# Patient Record
Sex: Female | Born: 1940 | Race: White | Hispanic: No | Smoking: Former smoker
Health system: Southern US, Community
[De-identification: ages and names within clinical notes are randomized; demographics above are authoritative.]

## PROBLEM LIST (undated history)

## (undated) DIAGNOSIS — K5790 Diverticulosis of intestine, part unspecified, without perforation or abscess without bleeding: Secondary | ICD-10-CM

## (undated) DIAGNOSIS — D510 Vitamin B12 deficiency anemia due to intrinsic factor deficiency: Secondary | ICD-10-CM

## (undated) DIAGNOSIS — J449 Chronic obstructive pulmonary disease, unspecified: Secondary | ICD-10-CM

## (undated) DIAGNOSIS — R519 Headache, unspecified: Secondary | ICD-10-CM

## (undated) DIAGNOSIS — K219 Gastro-esophageal reflux disease without esophagitis: Secondary | ICD-10-CM

## (undated) DIAGNOSIS — J439 Emphysema, unspecified: Secondary | ICD-10-CM

## (undated) DIAGNOSIS — E0500 Thyrotoxicosis with diffuse goiter without thyrotoxic crisis or storm: Secondary | ICD-10-CM

## (undated) DIAGNOSIS — K449 Diaphragmatic hernia without obstruction or gangrene: Secondary | ICD-10-CM

## (undated) DIAGNOSIS — B019 Varicella without complication: Secondary | ICD-10-CM

## (undated) DIAGNOSIS — D649 Anemia, unspecified: Secondary | ICD-10-CM

## (undated) DIAGNOSIS — R51 Headache: Secondary | ICD-10-CM

## (undated) DIAGNOSIS — I639 Cerebral infarction, unspecified: Secondary | ICD-10-CM

## (undated) DIAGNOSIS — K635 Polyp of colon: Secondary | ICD-10-CM

## (undated) DIAGNOSIS — E785 Hyperlipidemia, unspecified: Secondary | ICD-10-CM

## (undated) DIAGNOSIS — M1990 Unspecified osteoarthritis, unspecified site: Secondary | ICD-10-CM

## (undated) DIAGNOSIS — Z972 Presence of dental prosthetic device (complete) (partial): Secondary | ICD-10-CM

## (undated) HISTORY — DX: Chronic obstructive pulmonary disease, unspecified: J44.9

## (undated) HISTORY — PX: OTHER SURGICAL HISTORY: SHX169

## (undated) HISTORY — DX: Emphysema, unspecified: J43.9

## (undated) HISTORY — PX: MASTECTOMY: SHX3

## (undated) HISTORY — DX: Thyrotoxicosis with diffuse goiter without thyrotoxic crisis or storm: E05.00

## (undated) HISTORY — DX: Anemia, unspecified: D64.9

## (undated) HISTORY — DX: Hyperlipidemia, unspecified: E78.5

## (undated) HISTORY — PX: EYE SURGERY: SHX253

## (undated) HISTORY — PX: CHOLECYSTECTOMY: SHX55

---

## 1971-07-10 HISTORY — PX: BREAST SURGERY: SHX581

## 1972-07-09 HISTORY — PX: ABDOMINAL HYSTERECTOMY: SHX81

## 1974-07-09 HISTORY — PX: AUGMENTATION MAMMAPLASTY: SUR837

## 1994-07-09 HISTORY — PX: AUGMENTATION MAMMAPLASTY: SUR837

## 2005-06-04 ENCOUNTER — Ambulatory Visit: Payer: Self-pay | Admitting: Internal Medicine

## 2008-05-07 ENCOUNTER — Inpatient Hospital Stay: Payer: Self-pay | Admitting: Specialist

## 2011-01-01 ENCOUNTER — Ambulatory Visit: Payer: Self-pay | Admitting: Gastroenterology

## 2012-12-10 ENCOUNTER — Inpatient Hospital Stay: Payer: Self-pay | Admitting: Internal Medicine

## 2012-12-10 LAB — CK TOTAL AND CKMB (NOT AT ARMC)
CK, Total: 120 U/L (ref 21–215)
CK, Total: 143 U/L (ref 21–215)
CK-MB: 2.7 ng/mL (ref 0.5–3.6)

## 2012-12-10 LAB — COMPREHENSIVE METABOLIC PANEL
Albumin: 3.3 g/dL — ABNORMAL LOW (ref 3.4–5.0)
Anion Gap: 5 — ABNORMAL LOW (ref 7–16)
BUN: 14 mg/dL (ref 7–18)
Bilirubin,Total: 0.4 mg/dL (ref 0.2–1.0)
Chloride: 112 mmol/L — ABNORMAL HIGH (ref 98–107)
Co2: 21 mmol/L (ref 21–32)
Creatinine: 0.93 mg/dL (ref 0.60–1.30)
EGFR (African American): >60
Glucose: 101 mg/dL — ABNORMAL HIGH (ref 65–99)
Potassium: 3.9 mmol/L (ref 3.5–5.1)
SGOT(AST): 52 U/L — ABNORMAL HIGH (ref 15–37)
SGPT (ALT): 35 U/L (ref 12–78)
Sodium: 138 mmol/L (ref 136–145)
Total Protein: 6.8 g/dL (ref 6.4–8.2)

## 2012-12-10 LAB — CBC
MCH: 30.9 pg (ref 26.0–34.0)
MCV: 93 fL (ref 80–100)
RDW: 13.6 % (ref 11.5–14.5)
WBC: 3 10*3/uL — ABNORMAL LOW (ref 3.6–11.0)

## 2012-12-10 LAB — TROPONIN I: Troponin-I: <0.02 ng/mL

## 2012-12-11 LAB — URINALYSIS, COMPLETE
Bilirubin,UR: NEGATIVE
Nitrite: NEGATIVE
Ph: 6 (ref 4.5–8.0)
RBC,UR: 1 /HPF (ref 0–5)
Specific Gravity: 1.011 (ref 1.003–1.030)
Squamous Epithelial: 2

## 2012-12-11 LAB — CBC WITH DIFFERENTIAL/PLATELET
Basophil #: 0 10*3/uL (ref 0.0–0.1)
Basophil %: 0.9 %
Eosinophil #: 0.1 10*3/uL (ref 0.0–0.7)
Lymphocyte #: 1.3 10*3/uL (ref 1.0–3.6)
Lymphocyte %: 31.3 %
MCH: 30.2 pg (ref 26.0–34.0)
MCV: 92 fL (ref 80–100)
Monocyte #: 0.4 x10 3/mm (ref 0.2–0.9)
Neutrophil #: 2.4 10*3/uL (ref 1.4–6.5)
Neutrophil %: 57.3 %
Platelet: 113 10*3/uL — ABNORMAL LOW (ref 150–440)
RBC: 3.5 10*6/uL — ABNORMAL LOW (ref 3.80–5.20)
RDW: 13.4 % (ref 11.5–14.5)

## 2012-12-11 LAB — BASIC METABOLIC PANEL
Anion Gap: 4 — ABNORMAL LOW (ref 7–16)
Calcium, Total: 8.1 mg/dL — ABNORMAL LOW (ref 8.5–10.1)
Chloride: 113 mmol/L — ABNORMAL HIGH (ref 98–107)
Co2: 21 mmol/L (ref 21–32)
Creatinine: 0.79 mg/dL (ref 0.60–1.30)
EGFR (African American): >60
Glucose: 96 mg/dL (ref 65–99)
Osmolality: 276 (ref 275–301)
Potassium: 3.4 mmol/L — ABNORMAL LOW (ref 3.5–5.1)

## 2012-12-11 LAB — CK TOTAL AND CKMB (NOT AT ARMC): CK, Total: 137 U/L (ref 21–215)

## 2012-12-11 LAB — LIPID PANEL
Ldl Cholesterol, Calc: 68 mg/dL (ref 0–100)
Triglycerides: 54 mg/dL (ref 0–200)

## 2013-07-04 ENCOUNTER — Emergency Department: Payer: Self-pay | Admitting: Emergency Medicine

## 2013-07-04 LAB — RAPID INFLUENZA A&B ANTIGENS

## 2013-12-09 ENCOUNTER — Ambulatory Visit: Payer: Self-pay | Admitting: Orthopedic Surgery

## 2013-12-09 DIAGNOSIS — S22080A Wedge compression fracture of T11-T12 vertebra, initial encounter for closed fracture: Secondary | ICD-10-CM | POA: Insufficient documentation

## 2013-12-09 LAB — CBC WITH DIFFERENTIAL/PLATELET
Basophil #: 0.1 10*3/uL (ref 0.0–0.1)
Basophil %: 1.4 %
EOS PCT: 3.3 %
Eosinophil #: 0.1 10*3/uL (ref 0.0–0.7)
HCT: 36.1 % (ref 35.0–47.0)
HGB: 11.7 g/dL — ABNORMAL LOW (ref 12.0–16.0)
LYMPHS ABS: 2.2 10*3/uL (ref 1.0–3.6)
LYMPHS PCT: 49.1 %
MCH: 30.3 pg (ref 26.0–34.0)
MCHC: 32.5 g/dL (ref 32.0–36.0)
MCV: 93 fL (ref 80–100)
Monocyte #: 0.4 x10 3/mm (ref 0.2–0.9)
Monocyte %: 9 %
NEUTROS ABS: 1.6 10*3/uL (ref 1.4–6.5)
NEUTROS PCT: 37.2 %
Platelet: 132 10*3/uL — ABNORMAL LOW (ref 150–440)
RBC: 3.88 10*6/uL (ref 3.80–5.20)
RDW: 12.7 % (ref 11.5–14.5)
WBC: 4.4 10*3/uL (ref 3.6–11.0)

## 2013-12-10 ENCOUNTER — Ambulatory Visit: Payer: Self-pay | Admitting: Orthopedic Surgery

## 2013-12-10 HISTORY — PX: BACK SURGERY: SHX140

## 2013-12-10 HISTORY — PX: KYPHOPLASTY: SHX5884

## 2013-12-11 LAB — PATHOLOGY REPORT

## 2013-12-25 DIAGNOSIS — Z9889 Other specified postprocedural states: Secondary | ICD-10-CM | POA: Insufficient documentation

## 2014-02-17 DIAGNOSIS — I509 Heart failure, unspecified: Secondary | ICD-10-CM | POA: Insufficient documentation

## 2014-07-22 ENCOUNTER — Ambulatory Visit: Payer: Self-pay | Admitting: Family Medicine

## 2014-10-29 NOTE — H&P (Signed)
PATIENT NAME:  Robin Graves, Robin Graves MR#:  [id number] DATE OF BIRTH:  17-Aug-1940  DATE OF ADMISSION:  12/10/2012  PRIMARY CARE PHYSICIAN: Dr. Jeananne Rama.   PRIMARY CARDIOLOGIST:  Dr. Saralyn Pilar.  CHIEF COMPLAINT: Chest pain.   HISTORY OF PRESENT ILLNESS: A 74 year old female with a history of coronary artery disease, hyperlipidemia, and Graves' disease who presents with the above complaint. 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 On a scale of 1 to 10 it was a 7 out of 10, worsened by any kind of activity and relieved after nitroglycerin. EMS was called. Her heart rates were initially in the 40s; improved to the 70s with 0.5 mg of  atropine. She also was given aspirin. Upon arrival she was noted to have a low blood pressure of 90/60. She was given a fluid bolus. Her chest pain has resolved and her blood pressure has improved. She received nitroglycerin spray, fentanyl and Zofran.   REVIEW OF SYSTEMS:  CONSTITUTIONAL: No fevers or weakness, although she did say that yesterday she was not feeling quite herself.  EYES: No blurred or double vision. No glaucoma.  EARS/NOSE/THROAT: No ear pain, hearing loss, seasonal allergies, or dentures.  RESPIRATOR: No cough, wheezing, hemoptysis, painful respirations.  CARDIOVASCULAR: Positive chest pain, as mentioned above. No palpitations, orthopnea, syncope, edema, dyspnea on exertion.  GASTROINTESTINAL: No nausea, vomiting, diarrhea, abdominal pain, melena, or ulcers.  GENITOURINARY: No dysuria or hematuria.  ENDOCRINE: No polyuria, polydipsia.  HEMATOLOGIC/LYMPHATIC: No anemia or easy bruising.  MUSCULOSKELETAL:  Noarthritis pain NEUROLOGIC:  No history of CVA or seizures.  PSYCHIATRIC: No history of anxiety or depression.   PAST MEDICAL HISTORY: 1.  Graves' disease.  2.  CAD, status post MI in 1982. 3.   Hypothyroidism.  4.  Hyperlipidemia.   SURGICAL HISTORY: 1.  Bilateral mastectomies.  2.  Total hysterectomy.   MEDICATIONS: 1.  Synthroid 50 mcg daily.  2.  Pravastatin 80 mg daily.  3.  Pantoprazole 40 mg daily.  4.  Vitamin B12 1000 mcg monthly.  5.  Atenolol 25 mg daily.  6.  Aspirin 81 mg daily.   ALLERGIES: No known drug allergies.   SOCIAL HISTORY: Patient smokes maybe 1 cigarette every 2 to 3 weeks. No alcohol or IV drug use.   FAMILY HISTORY: No history of CAD under 55. DVTs and PEs in the family.   PHYSICAL EXAMINATION: VITAL SIGNS: Temperature 97.7, pulse is 55, respirations 18, blood pressure 94/51; 94% on room air.  GENERAL: The patient is alert, oriented, not in acute distress.   HEENT: Head is atraumatic. Pupils are round and reactive. Sclerae anicteric. Mucous membranes moist.  OROPHARYNX: Clear.  NECK: Supple, without JVD, carotid bruit or enlarged thyroid.  CARDIOVASCULAR: Regular rate and rhythm. No murmurs, gallops, or rubs. PMI is not displaced.  LUNGS: Clear to auscultation, without crackles, rales, or wheezing. She does have some distant heart sounds. She has some minimal rhonchi in the right, greater than left.  ABDOMEN: Bowel sounds are positive. Nontender, nondistended. No hepatosplenomegaly.  EXTREMITIES: No clubbing, cyanosis, or edema.  SKIN: Normal color. No rashes are seen.   NEUROLOGIC: Cranial nerves II-XII are grossly intact. There are no focal deficits. Motor strength is 4, bilateral, upper and lower extremities.   LABORATORIES: Sodium 138, potassium 3.9, chloride 112, bicarb 21, BUN 14, creatinine 0.93, glucose 101, calcium 8.4, bilirubin is 0.4,  alk phos 144, ALT 35, AST 52, total protein 6.8, albumin 2.3. White blood cells 3, hemoglobin 9.5, hematocrit 34.5, platelets are 120. Troponin are less than 0.02. TSH 1.08. CPK 120, CK-MB 1.2.   Chest x-ray shows possible atelectasis versus developing pneumonia, left lower lung base.   EKG shows  normal sinus rhythm. No ST elevation or depression.   ASSESSMENT AND PLAN: This is a 74 year old female with a history of coronary artery disease who presents with unstable angina.   1.  Unstable angina: The patient will be admitted to the step-down unit due to her bradycardia. She will be evaluated for acute myocardial infarction by continuing to cycle her cardiac enzymes. Her platelets are slightly. I placed her on Lovenox 1 mg/kg subcutaneous every  12 hours, so will need to monitor carefully her platelets also. Continue aspirin. Hold beta blocker due to her bradycardia, and will continue nitroglycerin patch as well as Pravachol. I have also consulted Dr. Saralyn Pilar for assistance. 2.  Bradycardia: The patient's heart rate has improved with atropine. Will hold atenolol. The patient currently has Zoll pads on, which we will continue, and will monitor her heart rate closely. We may need to use atropine if she is symptomatic from her bradycardia or heart rate is in the 40s again, or lower.  3.  Hyperlipidemia: Will continue pravastatin.  4.  Hypothyroidism, with a history of Graves' disease, on Synthroid: Continue Synthroid.  5. Smoking dependence: The patient still smokes 1 cigarette every couple of weeks, and she was encouraged to stop smoking. She was counseled for 3 minutes. She says that she does not really want to give up her 1 cigarette every few weeks.  6.  THE PATIENT IS  FULL CODE.   Time spent: Approximately 50 minutes.   ____________________________ Donell Beers. Benjie Karvonen, MD spm:dm D: 12/10/2012 12:06:48 ET T: 12/10/2012 12:44:17 ET JOB#: [id number]  cc: Reizel Calzada P. Benjie Karvonen, MD, <Dictator> Isaias Cowman, MD Guadalupe Maple, MD Donell Beers Camie Hauss MD ELECTRONICALLY SIGNED 12/10/2012 14:36

## 2014-10-29 NOTE — Consult Note (Signed)
   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 She described this as 8/10. She was brought to the er where she was noted to have a normal serum troponin and ekg was unremakrable for acute ischemic changes. SHe has had no further chest pain and continues to rule out for mi.   Physical Exam:  GEN no acute distress   HEENT PERRL, hearing intact to voice   NECK supple   RESP clear BS   CARD Regular rate and rhythm   ABD denies tenderness  denies Flank Tenderness  no hernia  normal BS   LYMPH negative neck   EXTR negative cyanosis/clubbing, negative edema   SKIN normal to palpation   NEURO cranial nerves intact, motor/sensory function intact   PSYCH A+O to time, place, person   Review of Systems:  Subjective/Chief Complaint chestpain with radiation to left arm   General: No Complaints   Skin: No Complaints   ENT: No Complaints   Eyes: No Complaints   Neck: No Complaints   Respiratory: No Complaints   Cardiovascular: Chest pain or discomfort  Tightness   Gastrointestinal: No Complaints   Genitourinary: No Complaints   Vascular: No Complaints   Musculoskeletal: No Complaints   Neurologic: No Complaints   Hematologic: No Complaints   Endocrine: No Complaints   Psychiatric: No Complaints   Review of Systems: All other systems were reviewed and found to be negative   Medications/Allergies Reviewed Medications/Allergies reviewed        Admit Diagnosis:   UNSTABLE ANGINE PECTORIS: Onset Date: 10-Dec-2012, Status: Active, Description: UNSTABLE ANGINE PECTORIS  EKG:  EKG NSR   Abnormal NSSTTW changes    No Known Allergies:    Impression 74 yo femal with histoyr of cad s/p mi in the past who was admitted after presenitng with mid sternal chest  pain occurring while working in [employer]. SHe has ruled out for an mi and ekg is unremarkable. No chf clinnically or by cxr. Etiology of pain is typical for angina but no evidence of injury or ischemia at present   Plan 1. Conitnue with current meds and rule out for mi 2. Will review echo when availabe 3. FUncitonal study in am to assess for ischemia 4. Further recs after myoview.   Electronic Signatures: Teodoro Spray (MD)  (Signed 04-Jun-14 21:12)  Authored: General Aspect/Present Illness, History and Physical Exam, Review of System, Health Issues, EKG , Allergies, Impression/Plan   Last Updated: 04-Jun-14 21:12 by Teodoro Spray (MD)

## 2014-10-29 NOTE — Discharge Summary (Signed)
PATIENT NAME:  Robin Graves, Robin Graves MR#:  [id number] DATE OF BIRTH:  03/06/1941  DATE OF ADMISSION:  12/10/2012 DATE OF DISCHARGE:  12/11/2012  ADMISSION DIAGNOSIS: 1.  Chest pain.  2.  Bradycardia.  DISCHARGE DIAGNOSES: 1.  Chest pain.  2.  Bradycardia asymptomatic.   CONSULTATIONS: Cardiology.  2-D echocardiogram showed normal ejection fraction with EF of 55% to 60% with mild mitral valve regurg and tricuspid regurg.    Myovoew showed no evidence of ischemia Troponins were negative.   Sodium 138, potassium 4.8, chloride 113, bicarb 21, BUN 14, creatinine 0.79, glucose 96. LDL is 68, VLDL 11, HDL 38, triglycerides 54, cholesterol 117. White blood cells 4.3, hemoglobin 11, hematocrit 33, platelets are 113.   HOSPITAL COURSE:  A 74 year old female presented with chest pain found to have bradycardia. For further details, please refer to H and P.  1.  Chest pain.  The patient is undergoing a Myoview stress test.  This is negative for acute ischemia. The patient will be discharged home. Her troponins were negative. No acute changes on telemetry. 2.  Bradycardia, asymptomatic. The patient received atropine by the EMS for heart rate in the 40s. However, here her heart rate runs anywhere between 45 to actually 80.  We stopped atenolol and this has actually improved her heart rate. Her heart rate does increase with ambulation.  3.  Hyperlipidemia.  The patient will continue statin.  4.  Tobacco abuse.  The patient was counseled. She smokes a couple cigarettes weekly. She does not want a nicotine patch. 5.  Urinary tract infection.  The patient was started on Keflex.  DISCHARGE MEDICATIONS: 1.  Aspirin 81 mg daily.  2.  Pravastatin 80 mg daily.  3.  Pantoprazole 40 mg daily. 4.  Synthroid 50 mcg daily.  5.  Vitamin B12 1000 mcg monthly.  6.  Keflex 500 mg q. 8 hours x 7 days.   DISCHARGE DIET:  Low sodium.    DISCHARGE ACTIVITY:  As tolerated.  DISCHARGE FOLLOWUP:  The patient will follow up  with Dr. Ubaldo Glassing in 1 week and Dr. Golden Pop in 1 week.  TIME SPENT:  35 minutes   ____________________________ Neziah Braley P. Benjie Karvonen, MD spm:ce D: 12/11/2012 14:41:36 ET T: 12/11/2012 15:40:53 ET JOB#: [id number]  cc: Mehki Klumpp P. Benjie Karvonen, MD, <Dictator> Guadalupe Maple, MD Donell Beers Jethro Radke MD ELECTRONICALLY SIGNED 12/12/2012 14:07

## 2014-10-30 NOTE — Op Note (Signed)
PATIENT NAME:  Robin Graves, Robin Graves MR#:  [id number] DATE OF BIRTH:  02/09/1941  DATE OF PROCEDURE:  12/10/2013  PREOPERATIVE DIAGNOSIS: T12 compression fracture.   POSTOPERATIVE DIAGNOSIS: T12 compression fracture.   PROCEDURE: T12 kyphoplasty.   ANESTHESIA: MAC.   SURGEON: Hessie Knows, MD   DESCRIPTION OF PROCEDURE: The patient was brought to the operating room, and after adequate anesthesia was obtained, the patient was placed prone. C-arm was brought in and very good visualization of the T12 compressed fracture was obtained. Timeout procedure and patient identification procedure completed. The skin was prepped with alcohol and 5 mL of 1% Xylocaine was infiltrated on both sides at T12 in the subcutaneous tissue. Next, the back was prepped and draped in the usual sterile fashion.  Repeat timeout procedure completed. Spinal needle was then used to give local anesthetic down to the pedicle on the right side with a 50/50 mix of 1% Xylocaine and 0.5% Sensorcaine with epinephrine. Stab incision was made in the trocar from the kyphoplasty set, advanced to the level of the pedicle, extrapedicular approach being utilized. The vertebral body was entered and a biopsy obtained. The drill was then passed under fluoroscopic guidance and a balloon inserted and inflated. There appeared to be a partial correction of a significant deformity. After inflating the balloon, the balloon was deflated and the cement when ready was inserted with approximately 3 mL inserted. There was very good fill on interdigitation.  The trocar was removed and permanent C-arm views were obtained. The wound was closed with Dermabond, followed by a Band-Aid. The patient was sent to the recovery room in stable condition.  ESTIMATED BLOOD LOSS:  Minimal.   COMPLICATIONS: None.   SPECIMEN: T12 vertebral body biopsy.   CONDITION IN RECOVERY ROOM:  Stable.    ____________________________ Laurene Footman, MD mjm:dd D: 12/11/2013 00:02:09  ET T: 12/11/2013 05:06:04 ET JOB#: [id number]  cc: Laurene Footman, MD, <Dictator> Laurene Footman MD ELECTRONICALLY SIGNED 12/11/2013 11:51

## 2015-01-06 ENCOUNTER — Ambulatory Visit (INDEPENDENT_AMBULATORY_CARE_PROVIDER_SITE_OTHER): Payer: Medicare Other | Admitting: Family Medicine

## 2015-01-06 ENCOUNTER — Encounter: Payer: Self-pay | Admitting: Family Medicine

## 2015-01-06 VITALS — BP 131/64 | HR 69 | Temp 97.9°F | Ht <= 58 in | Wt 132.0 lb

## 2015-01-06 DIAGNOSIS — I10 Essential (primary) hypertension: Secondary | ICD-10-CM | POA: Diagnosis not present

## 2015-01-06 DIAGNOSIS — E785 Hyperlipidemia, unspecified: Secondary | ICD-10-CM | POA: Diagnosis not present

## 2015-01-06 DIAGNOSIS — E039 Hypothyroidism, unspecified: Secondary | ICD-10-CM | POA: Insufficient documentation

## 2015-01-06 DIAGNOSIS — I2510 Atherosclerotic heart disease of native coronary artery without angina pectoris: Secondary | ICD-10-CM | POA: Diagnosis not present

## 2015-01-06 DIAGNOSIS — I2583 Coronary atherosclerosis due to lipid rich plaque: Secondary | ICD-10-CM

## 2015-01-06 DIAGNOSIS — E079 Disorder of thyroid, unspecified: Secondary | ICD-10-CM | POA: Diagnosis not present

## 2015-01-06 MED ORDER — PRAVASTATIN SODIUM 80 MG PO TABS: 80.0000 mg | ORAL_TABLET | Freq: Every day | ORAL | Status: DC

## 2015-01-06 MED ORDER — IBUPROFEN 800 MG PO TABS: 800.0000 mg | ORAL_TABLET | Freq: Three times a day (TID) | ORAL | Status: DC | PRN

## 2015-01-06 MED ORDER — LEVOTHYROXINE SODIUM 75 MCG PO TABS: 75.0000 ug | ORAL_TABLET | Freq: Every day | ORAL | Status: DC

## 2015-01-06 NOTE — Progress Notes (Signed)
   BP 131/64 mmHg  Pulse 69  Temp(Src) 97.9 F (36.6 C)  Ht 4\' 9"  (1.448 m)  Wt 132 lb (59.875 kg)  BMI 28.56 kg/m2  SpO2 97%   Subjective:    Patient ID: Robin Graves, female    DOB: Sep 05, 1940, 74 y.o.   MRN: [id number]  HPI: Robin Graves is a 74 y.o. female  Chief Complaint  Patient presents with  . Hypertension  BP doing well off meds Thyroid no problems CAD no sx Lipids stable on meds No side effects meds Takes every day   Relevant past medical, surgical, family and social history reviewed and updated as indicated. Interim medical history since our last visit reviewed. Allergies and medications reviewed and updated.  Review of Systems  Constitutional: Negative.   Respiratory: Negative.   Cardiovascular: Negative.     Per HPI unless specifically indicated above     Objective:    BP 131/64 mmHg  Pulse 69  Temp(Src) 97.9 F (36.6 C)  Ht 4\' 9"  (1.448 m)  Wt 132 lb (59.875 kg)  BMI 28.56 kg/m2  SpO2 97%  Wt Readings from Last 3 Encounters:  01/06/15 132 lb (59.875 kg)  07/07/14 129 lb (58.514 kg)    Physical Exam  Constitutional: She is oriented to person, place, and time. She appears well-developed and well-nourished. No distress.  HENT:  Head: Normocephalic and atraumatic.  Right Ear: Hearing normal.  Left Ear: Hearing normal.  Nose: Nose normal.  Eyes: Conjunctivae and lids are normal. Right eye exhibits no discharge. Left eye exhibits no discharge. No scleral icterus.  Cardiovascular: Normal rate, regular rhythm and normal heart sounds.   Pulmonary/Chest: Effort normal and breath sounds normal. No respiratory distress.  Musculoskeletal: Normal range of motion.  Neurological: She is alert and oriented to person, place, and time.  Skin: Skin is intact. No rash noted.  Psychiatric: She has a normal mood and affect. Her speech is normal and behavior is normal. Judgment and thought content normal. Cognition and memory are normal.        Assessment &  Plan:   Problem List Items Addressed This Visit      Cardiovascular and Mediastinum   Hypertension    Diet controled      Relevant Medications   pravastatin (PRAVACHOL) 80 MG tablet   CAD (coronary artery disease)    Followed at cardiology      Relevant Medications   pravastatin (PRAVACHOL) 80 MG tablet     Endocrine   Thyroid disease    The current medical regimen is effective;  continue present plan and medications.       Relevant Medications   levothyroxine (SYNTHROID, LEVOTHROID) 75 MCG tablet     Other   Hyperlipidemia    The current medical regimen is effective;  continue present plan and medications.       Relevant Medications   pravastatin (PRAVACHOL) 80 MG tablet   Other Relevant Orders   AST   ALT   Lipid panel    Other Visit Diagnoses    Essential hypertension, benign    -  Primary    Relevant Medications    pravastatin (PRAVACHOL) 80 MG tablet    Other Relevant Orders    Basic metabolic panel        Follow up plan: Return in about 6 months (around 07/08/2015) for Physical Exam.

## 2015-01-06 NOTE — Assessment & Plan Note (Signed)
The current medical regimen is effective;  continue present plan and medications.  

## 2015-01-06 NOTE — Assessment & Plan Note (Signed)
Followed at cardiology

## 2015-01-06 NOTE — Assessment & Plan Note (Signed)
Diet controled 

## 2015-01-07 LAB — BASIC METABOLIC PANEL
BUN/Creatinine Ratio: 10 — ABNORMAL LOW (ref 11–26)
BUN: 9 mg/dL (ref 8–27)
CALCIUM: 8.5 mg/dL — AB (ref 8.7–10.3)
CO2: 25 mmol/L (ref 18–29)
CREATININE: 0.88 mg/dL (ref 0.57–1.00)
Chloride: 103 mmol/L (ref 97–108)
GFR calc non Af Amer: 65 mL/min/{1.73_m2} (ref >59)
GFR, EST AFRICAN AMERICAN: 75 mL/min/{1.73_m2} (ref >59)
Glucose: 81 mg/dL (ref 65–99)
POTASSIUM: 4.2 mmol/L (ref 3.5–5.2)
Sodium: 138 mmol/L (ref 134–144)

## 2015-01-07 LAB — LIPID PANEL
CHOL/HDL RATIO: 3.3 ratio (ref 0.0–4.4)
Cholesterol, Total: 141 mg/dL (ref 100–199)
HDL: 43 mg/dL (ref >39)
LDL Calculated: 76 mg/dL (ref 0–99)
Triglycerides: 110 mg/dL (ref 0–149)
VLDL CHOLESTEROL CAL: 22 mg/dL (ref 5–40)

## 2015-01-07 LAB — ALT
ALT: 6 IU/L (ref 0–32)
ALT: 6 IU/L (ref 0–32)

## 2015-01-07 LAB — AST
AST: 14 IU/L (ref 0–40)
AST: 13 IU/L (ref 0–40)

## 2015-03-08 ENCOUNTER — Other Ambulatory Visit: Payer: Self-pay | Admitting: Orthopedic Surgery

## 2015-03-08 ENCOUNTER — Ambulatory Visit
Admission: RE | Admit: 2015-03-08 | Discharge: 2015-03-08 | Disposition: A | Discharge status: Home / Self Care | Payer: Medicare Other | Source: Ambulatory Visit | Attending: Orthopedic Surgery | Admitting: Orthopedic Surgery

## 2015-03-08 DIAGNOSIS — M4806 Spinal stenosis, lumbar region: Secondary | ICD-10-CM | POA: Diagnosis not present

## 2015-03-08 DIAGNOSIS — M545 Low back pain: Secondary | ICD-10-CM

## 2015-03-08 DIAGNOSIS — X58XXXA Exposure to other specified factors, initial encounter: Secondary | ICD-10-CM | POA: Insufficient documentation

## 2015-03-08 DIAGNOSIS — M546 Pain in thoracic spine: Secondary | ICD-10-CM

## 2015-03-08 DIAGNOSIS — M4854XA Collapsed vertebra, not elsewhere classified, thoracic region, initial encounter for fracture: Secondary | ICD-10-CM | POA: Diagnosis not present

## 2015-03-10 ENCOUNTER — Other Ambulatory Visit: Payer: Medicare Other

## 2015-03-15 ENCOUNTER — Encounter: Admission: RE | Payer: Self-pay | Source: Ambulatory Visit

## 2015-03-15 ENCOUNTER — Ambulatory Visit: Admission: RE | Admit: 2015-03-15 | Payer: Medicare Other | Source: Ambulatory Visit | Admitting: Orthopedic Surgery

## 2015-03-15 SURGERY — KYPHOPLASTY
Anesthesia: Choice

## 2015-04-18 NOTE — Discharge Instructions (Signed)

## 2015-04-20 ENCOUNTER — Ambulatory Visit
Admission: RE | Admit: 2015-04-20 | Discharge: 2015-04-20 | Disposition: A | Discharge status: Home / Self Care | Payer: Medicare Other | Source: Ambulatory Visit | Attending: Ophthalmology | Admitting: Ophthalmology

## 2015-04-20 ENCOUNTER — Encounter
Admission: RE | Disposition: A | Discharge status: Home / Self Care | Payer: Self-pay | Source: Ambulatory Visit | Attending: Ophthalmology

## 2015-04-20 ENCOUNTER — Ambulatory Visit: Payer: Medicare Other | Admitting: Anesthesiology

## 2015-04-20 DIAGNOSIS — G43909 Migraine, unspecified, not intractable, without status migrainosus: Secondary | ICD-10-CM | POA: Diagnosis not present

## 2015-04-20 DIAGNOSIS — Z87891 Personal history of nicotine dependence: Secondary | ICD-10-CM | POA: Insufficient documentation

## 2015-04-20 DIAGNOSIS — I10 Essential (primary) hypertension: Secondary | ICD-10-CM | POA: Insufficient documentation

## 2015-04-20 DIAGNOSIS — J449 Chronic obstructive pulmonary disease, unspecified: Secondary | ICD-10-CM | POA: Insufficient documentation

## 2015-04-20 DIAGNOSIS — I2510 Atherosclerotic heart disease of native coronary artery without angina pectoris: Secondary | ICD-10-CM | POA: Insufficient documentation

## 2015-04-20 DIAGNOSIS — Z8673 Personal history of transient ischemic attack (TIA), and cerebral infarction without residual deficits: Secondary | ICD-10-CM | POA: Diagnosis not present

## 2015-04-20 DIAGNOSIS — E7800 Pure hypercholesterolemia, unspecified: Secondary | ICD-10-CM | POA: Insufficient documentation

## 2015-04-20 DIAGNOSIS — H2511 Age-related nuclear cataract, right eye: Secondary | ICD-10-CM | POA: Insufficient documentation

## 2015-04-20 DIAGNOSIS — D510 Vitamin B12 deficiency anemia due to intrinsic factor deficiency: Secondary | ICD-10-CM | POA: Diagnosis not present

## 2015-04-20 DIAGNOSIS — E0500 Thyrotoxicosis with diffuse goiter without thyrotoxic crisis or storm: Secondary | ICD-10-CM | POA: Diagnosis not present

## 2015-04-20 DIAGNOSIS — K219 Gastro-esophageal reflux disease without esophagitis: Secondary | ICD-10-CM | POA: Insufficient documentation

## 2015-04-20 HISTORY — DX: Gastro-esophageal reflux disease without esophagitis: K21.9

## 2015-04-20 HISTORY — PX: CATARACT EXTRACTION W/PHACO: SHX586

## 2015-04-20 HISTORY — DX: Headache, unspecified: R51.9

## 2015-04-20 HISTORY — DX: Presence of dental prosthetic device (complete) (partial): Z97.2

## 2015-04-20 HISTORY — DX: Unspecified osteoarthritis, unspecified site: M19.90

## 2015-04-20 HISTORY — DX: Headache: R51

## 2015-04-20 HISTORY — DX: Cerebral infarction, unspecified: I63.9

## 2015-04-20 SURGERY — PHACOEMULSIFICATION, CATARACT, WITH IOL INSERTION
Anesthesia: Monitor Anesthesia Care | Laterality: Right | Wound class: Clean

## 2015-04-20 MED ORDER — ARMC OPHTHALMIC DILATING GEL
1.0000 "application " | OPHTHALMIC | Status: DC | PRN
  Administered 2015-04-20 (×2): 1 via OPHTHALMIC

## 2015-04-20 MED ORDER — EPINEPHRINE HCL 1 MG/ML IJ SOLN
INTRAMUSCULAR | Status: DC | PRN
  Administered 2015-04-20: 73 mL via OPHTHALMIC

## 2015-04-20 MED ORDER — TETRACAINE HCL 0.5 % OP SOLN
1.0000 [drp] | OPHTHALMIC | Status: DC | PRN
  Administered 2015-04-20: 1 [drp] via OPHTHALMIC

## 2015-04-20 MED ORDER — CEFUROXIME OPHTHALMIC INJECTION 1 MG/0.1 ML
INJECTION | OPHTHALMIC | Status: DC | PRN
  Administered 2015-04-20: 0.1 mL via INTRACAMERAL

## 2015-04-20 MED ORDER — BRIMONIDINE TARTRATE 0.2 % OP SOLN
OPHTHALMIC | Status: DC | PRN
  Administered 2015-04-20: 1 [drp] via OPHTHALMIC

## 2015-04-20 MED ORDER — FENTANYL CITRATE (PF) 100 MCG/2ML IJ SOLN
INTRAMUSCULAR | Status: DC | PRN
  Administered 2015-04-20: 50 ug via INTRAVENOUS

## 2015-04-20 MED ORDER — POVIDONE-IODINE 5 % OP SOLN
1.0000 "application " | OPHTHALMIC | Status: DC | PRN
  Administered 2015-04-20: 1 via OPHTHALMIC

## 2015-04-20 MED ORDER — NA HYALUR & NA CHOND-NA HYALUR 0.4-0.35 ML IO KIT
PACK | INTRAOCULAR | Status: DC | PRN
  Administered 2015-04-20: 1 mL via INTRAOCULAR

## 2015-04-20 MED ORDER — TIMOLOL MALEATE 0.5 % OP SOLN
OPHTHALMIC | Status: DC | PRN
  Administered 2015-04-20: 1 [drp] via OPHTHALMIC

## 2015-04-20 MED ORDER — MIDAZOLAM HCL 2 MG/2ML IJ SOLN
INTRAMUSCULAR | Status: DC | PRN
  Administered 2015-04-20: 1 mg via INTRAVENOUS

## 2015-04-20 MED ORDER — LACTATED RINGERS IV SOLN: INTRAVENOUS | Status: DC

## 2015-04-20 SURGICAL SUPPLY — 26 items
CANNULA ANT/CHMB 27GA (MISCELLANEOUS) ×3 IMPLANT
GLOVE SURG LX 7.5 STRW (GLOVE) ×2
GLOVE SURG LX STRL 7.5 STRW (GLOVE) ×1 IMPLANT
GLOVE SURG TRIUMPH 8.0 PF LTX (GLOVE) ×3 IMPLANT
GOWN STRL REUS W/ TWL LRG LVL3 (GOWN DISPOSABLE) ×2 IMPLANT
GOWN STRL REUS W/TWL LRG LVL3 (GOWN DISPOSABLE) ×4
LENS IOL TECNIS 22.5 (Intraocular Lens) ×3 IMPLANT
LENS IOL TECNIS MONO 1P 22.5 (Intraocular Lens) ×1 IMPLANT
MARKER SKIN SURG W/RULER VIO (MISCELLANEOUS) ×3 IMPLANT
NDL RETROBULBAR .5 NSTRL (NEEDLE) IMPLANT
NEEDLE FILTER BLUNT 18X 1/2SAF (NEEDLE) ×4
NEEDLE FILTER BLUNT 18X1 1/2 (NEEDLE) ×2 IMPLANT
PACK CATARACT BRASINGTON (MISCELLANEOUS) ×3 IMPLANT
PACK EYE AFTER SURG (MISCELLANEOUS) ×3 IMPLANT
PACK OPTHALMIC (MISCELLANEOUS) ×3 IMPLANT
RING MALYGIN 7.0 (MISCELLANEOUS) IMPLANT
SUT ETHILON 10-0 CS-B-6CS-B-6 (SUTURE)
SUT VICRYL  9 0 (SUTURE)
SUT VICRYL 9 0 (SUTURE) IMPLANT
SUTURE EHLN 10-0 CS-B-6CS-B-6 (SUTURE) IMPLANT
SYR 3ML LL SCALE MARK (SYRINGE) ×6 IMPLANT
SYR 5ML LL (SYRINGE) IMPLANT
SYR TB 1ML LUER SLIP (SYRINGE) ×3 IMPLANT
WATER STERILE IRR 250ML POUR (IV SOLUTION) ×3 IMPLANT
WATER STERILE IRR 500ML POUR (IV SOLUTION) IMPLANT
WIPE NON LINTING 3.25X3.25 (MISCELLANEOUS) ×3 IMPLANT

## 2015-04-20 NOTE — Op Note (Signed)
LOCATION:  [place]   PREOPERATIVE DIAGNOSIS:    Nuclear sclerotic cataract right eye. H25.11   POSTOPERATIVE DIAGNOSIS:  Nuclear sclerotic cataract right eye.     PROCEDURE:  Phacoemusification with posterior chamber intraocular lens placement of the right eye   LENS:   Implant Name Type Inv. Item Serial No. Manufacturer Lot No. LRB No. Used  LENS IMPL INTRAOC [id number] 22.5 - [id number] Intraocular Lens LENS IMPL INTRAOC ZCB00 22.5 [id number] AMO   Right 1        ULTRASOUND TIME: 14.5 % of 1 minutes, 25 seconds.  CDE 12.4   SURGEON:  Wyonia Hough, MD   ANESTHESIA:  Topical with tetracaine drops and 2% Xylocaine jelly.   COMPLICATIONS:  None.   DESCRIPTION OF PROCEDURE:  The patient was identified in the holding room and transported to the operating room and placed in the supine position under the operating microscope.  The right eye was identified as the operative eye and it was prepped and draped in the usual sterile ophthalmic fashion.   A 1 millimeter clear-corneal paracentesis was made at the 12:00 position.  The anterior chamber was filled with Viscoat viscoelastic.  A 2.4 millimeter keratome was used to make a near-clear corneal incision at the 9:00 position.  A curvilinear capsulorrhexis was made with a cystotome and capsulorrhexis forceps.  Balanced salt solution was used to hydrodissect and hydrodelineate the nucleus.   Phacoemulsification was then used in stop and chop fashion to remove the lens nucleus and epinucleus.  The remaining cortex was then removed using the irrigation and aspiration handpiece. Provisc was then placed into the capsular bag to distend it for lens placement.  A lens was then injected into the capsular bag.  The remaining viscoelastic was aspirated.   Wounds were hydrated with balanced salt solution.  The anterior chamber was inflated to a physiologic pressure with balanced salt solution.  No wound leaks were noted. Cefuroxime 0.1 ml  of a 10mg /ml solution was injected into the anterior chamber for a dose of 1 mg of intracameral antibiotic at the completion of the case.   Timolol and Brimonidine drops were applied to the eye.  The patient was taken to the recovery room in stable condition without complications of anesthesia or surgery.   Zackry Deines 04/20/2015, 11:06 AM

## 2015-04-20 NOTE — Anesthesia Preprocedure Evaluation (Signed)
Anesthesia Evaluation  Patient identified by MRN, date of birth, ID band  Reviewed: Allergy & Precautions, H&P , NPO status , Patient's Chart, lab work & pertinent test results  Airway Mallampati: II  TM Distance: >3 FB Neck ROM: full    Dental no notable dental hx.    Pulmonary COPD, former smoker,    Pulmonary exam normal        Cardiovascular hypertension, + CAD   Rhythm:regular Rate:Normal     Neuro/Psych CVA    GI/Hepatic GERD  ,  Endo/Other  Hyperthyroidism   Renal/GU      Musculoskeletal   Abdominal   Peds  Hematology   Anesthesia Other Findings   Reproductive/Obstetrics                             Anesthesia Physical Anesthesia Plan  ASA: III  Anesthesia Plan: MAC   Post-op Pain Management:    Induction:   Airway Management Planned:   Additional Equipment:   Intra-op Plan:   Post-operative Plan:   Informed Consent: I have reviewed the patients History and Physical, chart, labs and discussed the procedure including the risks, benefits and alternatives for the proposed anesthesia with the patient or authorized representative who has indicated his/her understanding and acceptance.     Plan Discussed with: CRNA  Anesthesia Plan Comments:         Anesthesia Quick Evaluation

## 2015-04-20 NOTE — Transfer of Care (Signed)
Immediate Anesthesia Transfer of Care Note  Patient: Robin Graves  Procedure(s) Performed: Procedure(s): CATARACT EXTRACTION PHACO AND INTRAOCULAR LENS PLACEMENT (IOC) (Right)  Patient Location: PACU  Anesthesia Type: MAC  Level of Consciousness: awake, alert  and patient cooperative  Airway and Oxygen Therapy: Patient Spontanous Breathing and Patient connected to supplemental oxygen  Post-op Assessment: Post-op Vital signs reviewed, Patient's Cardiovascular Status Stable, Respiratory Function Stable, Patent Airway and No signs of Nausea or vomiting  Post-op Vital Signs: Reviewed and stable  Complications: No apparent anesthesia complications

## 2015-04-20 NOTE — H&P (Signed)
  The History and Physical notes are on paper, have been signed, and are to be scanned. The patient remains stable and unchanged from the H&P.   Previous H&P reviewed, patient examined, and there are no changes.  Robin Graves 04/20/2015 10:42 AM

## 2015-04-20 NOTE — Anesthesia Postprocedure Evaluation (Signed)
  Anesthesia Post-op Note  Patient: Robin Graves  Procedure(s) Performed: Procedure(s): CATARACT EXTRACTION PHACO AND INTRAOCULAR LENS PLACEMENT (IOC) (Right)  Anesthesia type:MAC  Patient location: PACU  Post pain: Pain level controlled  Post assessment: Post-op Vital signs reviewed, Patient's Cardiovascular Status Stable, Respiratory Function Stable, Patent Airway and No signs of Nausea or vomiting  Post vital signs: Reviewed and stable  Last Vitals:  Filed Vitals:   04/20/15 1115  BP: 134/80  Pulse: 64  Temp:   Resp: 14    Level of consciousness: awake, alert  and patient cooperative  Complications: No apparent anesthesia complications

## 2015-04-20 NOTE — Anesthesia Procedure Notes (Signed)
Procedure Name: MAC Performed by: Kameren Pargas Pre-anesthesia Checklist: Patient identified, Emergency Drugs available, Suction available, Timeout performed and Patient being monitored Patient Re-evaluated:Patient Re-evaluated prior to inductionOxygen Delivery Method: Nasal cannula Placement Confirmation: positive ETCO2     

## 2015-04-21 ENCOUNTER — Encounter: Payer: Self-pay | Admitting: Ophthalmology

## 2015-05-13 ENCOUNTER — Telehealth: Payer: Self-pay | Admitting: Family Medicine

## 2015-05-13 ENCOUNTER — Encounter: Payer: Self-pay | Admitting: *Deleted

## 2015-05-13 NOTE — Telephone Encounter (Signed)
Pt called stated she is having a colonoscopy next week, she went for lab work today her thyroid was at a 91, pt stated this was too high wants to know if something can be called into the pharmacy for her thyroid. Pt advised she would most likely need to be seen. Pt wants to talk to [facility] or [facility] G[facility]. Please call pt or send something to the pharmacy. Pharm is [facility] on [address] Generic brand preferred. Thanks.

## 2015-05-16 ENCOUNTER — Ambulatory Visit: Payer: Medicare Other | Admitting: Anesthesiology

## 2015-05-16 ENCOUNTER — Encounter
Admission: RE | Disposition: A | Discharge status: Home / Self Care | Payer: Self-pay | Source: Ambulatory Visit | Attending: Gastroenterology

## 2015-05-16 ENCOUNTER — Other Ambulatory Visit: Payer: Self-pay | Admitting: Family Medicine

## 2015-05-16 ENCOUNTER — Encounter: Payer: Self-pay | Admitting: Anesthesiology

## 2015-05-16 ENCOUNTER — Ambulatory Visit
Admission: RE | Admit: 2015-05-16 | Discharge: 2015-05-16 | Disposition: A | Discharge status: Home / Self Care | Payer: Medicare Other | Source: Ambulatory Visit | Attending: Gastroenterology | Admitting: Gastroenterology

## 2015-05-16 DIAGNOSIS — K449 Diaphragmatic hernia without obstruction or gangrene: Secondary | ICD-10-CM | POA: Insufficient documentation

## 2015-05-16 DIAGNOSIS — E890 Postprocedural hypothyroidism: Secondary | ICD-10-CM | POA: Insufficient documentation

## 2015-05-16 DIAGNOSIS — D649 Anemia, unspecified: Secondary | ICD-10-CM | POA: Diagnosis present

## 2015-05-16 DIAGNOSIS — Z79899 Other long term (current) drug therapy: Secondary | ICD-10-CM | POA: Insufficient documentation

## 2015-05-16 DIAGNOSIS — I10 Essential (primary) hypertension: Secondary | ICD-10-CM | POA: Insufficient documentation

## 2015-05-16 DIAGNOSIS — Z7982 Long term (current) use of aspirin: Secondary | ICD-10-CM | POA: Insufficient documentation

## 2015-05-16 DIAGNOSIS — E785 Hyperlipidemia, unspecified: Secondary | ICD-10-CM | POA: Insufficient documentation

## 2015-05-16 DIAGNOSIS — D120 Benign neoplasm of cecum: Secondary | ICD-10-CM | POA: Diagnosis not present

## 2015-05-16 DIAGNOSIS — Z885 Allergy status to narcotic agent status: Secondary | ICD-10-CM | POA: Insufficient documentation

## 2015-05-16 DIAGNOSIS — M1990 Unspecified osteoarthritis, unspecified site: Secondary | ICD-10-CM | POA: Diagnosis not present

## 2015-05-16 DIAGNOSIS — K5520 Angiodysplasia of colon without hemorrhage: Secondary | ICD-10-CM | POA: Diagnosis not present

## 2015-05-16 DIAGNOSIS — E079 Disorder of thyroid, unspecified: Secondary | ICD-10-CM

## 2015-05-16 DIAGNOSIS — Z8673 Personal history of transient ischemic attack (TIA), and cerebral infarction without residual deficits: Secondary | ICD-10-CM | POA: Diagnosis not present

## 2015-05-16 DIAGNOSIS — Z87891 Personal history of nicotine dependence: Secondary | ICD-10-CM | POA: Diagnosis not present

## 2015-05-16 DIAGNOSIS — K529 Noninfective gastroenteritis and colitis, unspecified: Secondary | ICD-10-CM | POA: Diagnosis not present

## 2015-05-16 DIAGNOSIS — K219 Gastro-esophageal reflux disease without esophagitis: Secondary | ICD-10-CM | POA: Diagnosis not present

## 2015-05-16 DIAGNOSIS — J449 Chronic obstructive pulmonary disease, unspecified: Secondary | ICD-10-CM | POA: Insufficient documentation

## 2015-05-16 DIAGNOSIS — K5730 Diverticulosis of large intestine without perforation or abscess without bleeding: Secondary | ICD-10-CM | POA: Diagnosis not present

## 2015-05-16 DIAGNOSIS — I2510 Atherosclerotic heart disease of native coronary artery without angina pectoris: Secondary | ICD-10-CM | POA: Insufficient documentation

## 2015-05-16 HISTORY — PX: ESOPHAGOGASTRODUODENOSCOPY (EGD) WITH PROPOFOL: SHX5813

## 2015-05-16 HISTORY — PX: COLONOSCOPY WITH PROPOFOL: SHX5780

## 2015-05-16 HISTORY — DX: Thyrotoxicosis with diffuse goiter without thyrotoxic crisis or storm: E05.00

## 2015-05-16 SURGERY — COLONOSCOPY WITH PROPOFOL
Anesthesia: General

## 2015-05-16 MED ORDER — LIDOCAINE HCL (PF) 1 % IJ SOLN
INTRAMUSCULAR | Status: AC
  Administered 2015-05-16: 0.02 mL via INTRADERMAL
  Filled 2015-05-16: qty 2

## 2015-05-16 MED ORDER — FENTANYL CITRATE (PF) 100 MCG/2ML IJ SOLN
INTRAMUSCULAR | Status: DC | PRN
  Administered 2015-05-16: 50 ug via INTRAVENOUS

## 2015-05-16 MED ORDER — MIDAZOLAM HCL 5 MG/5ML IJ SOLN
INTRAMUSCULAR | Status: DC | PRN
Start: 2015-05-16 — End: 2015-05-16
  Administered 2015-05-16: 1 mg via INTRAVENOUS

## 2015-05-16 MED ORDER — SODIUM CHLORIDE 0.9 % IV SOLN
INTRAVENOUS | Status: DC
  Administered 2015-05-16: 1000 mL via INTRAVENOUS

## 2015-05-16 MED ORDER — LEVOTHYROXINE SODIUM 100 MCG PO TABS
100.0000 ug | ORAL_TABLET | Freq: Every day | ORAL | Status: DC
Start: 2015-05-16 — End: 2015-06-30

## 2015-05-16 MED ORDER — EPHEDRINE SULFATE 50 MG/ML IJ SOLN
INTRAMUSCULAR | Status: DC | PRN
  Administered 2015-05-16: 10 mg via INTRAVENOUS

## 2015-05-16 MED ORDER — PROPOFOL 10 MG/ML IV BOLUS
INTRAVENOUS | Status: DC | PRN
Start: 2015-05-16 — End: 2015-05-16
  Administered 2015-05-16: 50 mg via INTRAVENOUS
  Administered 2015-05-16: 20 mg via INTRAVENOUS

## 2015-05-16 MED ORDER — LIDOCAINE HCL (CARDIAC) 20 MG/ML IV SOLN
INTRAVENOUS | Status: DC | PRN
  Administered 2015-05-16: 50 mg via INTRAVENOUS

## 2015-05-16 MED ORDER — PROPOFOL 500 MG/50ML IV EMUL
INTRAVENOUS | Status: DC | PRN
  Administered 2015-05-16: 120 ug/kg/min via INTRAVENOUS

## 2015-05-16 MED ORDER — LIDOCAINE HCL (PF) 1 % IJ SOLN
2.0000 mL | Freq: Once | INTRAMUSCULAR | Status: AC
  Administered 2015-05-16: 0.02 mL via INTRADERMAL

## 2015-05-16 NOTE — Telephone Encounter (Signed)
Call pt 

## 2015-05-16 NOTE — Anesthesia Postprocedure Evaluation (Signed)
  Anesthesia Post-op Note  Patient: Robin Graves  Procedure(s) Performed: Procedure(s): COLONOSCOPY WITH PROPOFOL (N/A) ESOPHAGOGASTRODUODENOSCOPY (EGD) WITH PROPOFOL (N/A)  Anesthesia type:General  Patient location: PACU  Post pain: Pain level controlled  Post assessment: Post-op Vital signs reviewed, Patient's Cardiovascular Status Stable, Respiratory Function Stable, Patent Airway and No signs of Nausea or vomiting  Post vital signs: Reviewed and stable  Last Vitals:  Filed Vitals:   05/16/15 1700  BP: 124/67  Pulse: 85  Temp:   Resp: 22    Level of consciousness: awake, alert  and patient cooperative  Complications: No apparent anesthesia complications.  Dr Rayann Heman questioned patient about her burning on urination and patient stated that she gets this with her diarrhea, so he cancelled the UA and instructed the patient to call her primary care if she spikes a temp

## 2015-05-16 NOTE — Anesthesia Preprocedure Evaluation (Signed)
Anesthesia Evaluation  Patient identified by MRN, date of birth, ID band Patient awake    Reviewed: Allergy & Precautions, NPO status , Patient's Chart, lab work & pertinent test results, reviewed documented beta blocker date and time   Airway Mallampati: II  TM Distance: >3 FB     Dental  (+) Chipped, Lower Dentures, Upper Dentures   Pulmonary COPD, former smoker,           Cardiovascular hypertension, Pt. on medications      Neuro/Psych  Headaches, CVA    GI/Hepatic GERD  ,  Endo/Other  Hypothyroidism   Renal/GU      Musculoskeletal  (+) Arthritis ,   Abdominal   Peds  Hematology  (+) anemia ,   Anesthesia Other Findings   Reproductive/Obstetrics                             Anesthesia Physical Anesthesia Plan  ASA: III  Anesthesia Plan: General   Post-op Pain Management:    Induction:   Airway Management Planned: Nasal Cannula  Additional Equipment:   Intra-op Plan:   Post-operative Plan:   Informed Consent: I have reviewed the patients History and Physical, chart, labs and discussed the procedure including the risks, benefits and alternatives for the proposed anesthesia with the patient or authorized representative who has indicated his/her understanding and acceptance.     Plan Discussed with: CRNA  Anesthesia Plan Comments:         Anesthesia Quick Evaluation

## 2015-05-16 NOTE — Op Note (Signed)
[facility] Gastroenterology Patient Name: Robin Graves Procedure Date: 05/16/2015 3:59 PM MRN: [id number] Account #: [id number] Date of Birth: June 17, 1941 Admit Type: Outpatient Age: 74 Room: [facility] ENDO ROOM 2 Gender: Female Note Status: Finalized Procedure:         Colonoscopy Indications:       Chronic diarrhea, Anemia Patient Profile:   This is a 74 year old female. Providers:         Gerrit Heck. Rayann Heman, MD Referring MD:      Guadalupe Maple, MD (Referring MD) Complications:     No immediate complications. Procedure:         Pre-Anesthesia Assessment:                    - Prior to the procedure, a History and Physical was                     performed, and patient medications, allergies and                     sensitivities were reviewed. The patient's tolerance of                     previous anesthesia was reviewed.                    After obtaining informed consent, the colonoscope was                     passed under direct vision. Throughout the procedure, the                     patient's blood pressure, pulse, and oxygen saturations                     were monitored continuously. The Colonoscope was                     introduced through the anus and advanced to the the                     terminal ileum. The colonoscopy was performed without                     difficulty. The patient tolerated the procedure well. The                     quality of the bowel preparation was good. Findings:      The perianal and digital rectal examinations were normal.      A 6 mm polyp was found in the cecum. The polyp was sessile. The polyp       was removed with a hot snare. Resection and retrieval were complete.      Multiple medium-sized angioectasias without bleeding were found in the       ascending colon and in the cecum. Fulguration to ablate the lesion by       argon plasma at 0.5 liters/minute and 20 watts was successful.      Many small and large-mouthed  diverticula were found in the sigmoid colon.      Biopsies for histology were taken with a cold forceps from the right       colon, left colon and rectum for evaluation of microscopic colitis. Impression:        - One 6 mm polyp  in the cecum. Resected and retrieved.                    - Multiple non-bleeding colonic angioectasias. Treated                     with argon plasma coagulation (APC).                    - Diverticulosis in the sigmoid colon.                    - Biopsies were taken with a cold forceps from the right                     colon, left colon and rectum for evaluation of microscopic                     colitis. Recommendation:    - Observe patient in GI recovery unit.                    - Continue present medications.                    - Await pathology results.                    - Return to GI clinic.                    - Start PO iron. Follow blood counts.                    - Obtain capsule endoscopy if blood counts do not improve                     on iron therapy.                    - The findings and recommendations were discussed with the                     patient.                    - The findings and recommendations were discussed with the                     patient's family. Procedure Code(s): --- Professional ---                    [phone], 11, Colonoscopy, flexible; with control of                     bleeding, any method                    45385, Colonoscopy, flexible; with removal of tumor(s),                     polyp(s), or other lesion(s) by snare technique CPT copyright 2014 American Medical Association. All rights reserved. The codes documented in this report are preliminary and upon coder review may  be revised to meet current compliance requirements. Mellody Life, MD 05/16/2015 4:40:33 PM This report has been signed electronically. Number of Addenda: 0 Note Initiated On: 05/16/2015 3:59 PM Scope Withdrawal Time: 0 hours 19 minutes 48  seconds  Total Procedure Duration: 0 hours 24 minutes 42 seconds  [facility]

## 2015-05-16 NOTE — Progress Notes (Signed)
Phone call Discussed with patient reported TSH up to 14 patient states she's been taking her thyroid medication faithfully with no change in [place] she is taking it or other interfering factors. Takes in the morning first thing a glass of water no other food.

## 2015-05-16 NOTE — Transfer of Care (Signed)
Immediate Anesthesia Transfer of Care Note  Patient: Robin Graves  Procedure(s) Performed: Procedure(s): COLONOSCOPY WITH PROPOFOL (N/A) ESOPHAGOGASTRODUODENOSCOPY (EGD) WITH PROPOFOL (N/A)  Patient Location: Endoscopy Unit  Anesthesia Type:General  Level of Consciousness: awake  Airway & Oxygen Therapy: Patient Spontanous Breathing and Patient connected to nasal cannula oxygen  Post-op Assessment: Report given to RN  Post vital signs: Reviewed  Last Vitals:  Filed Vitals:   05/16/15 1630  BP: 130/68  Pulse: 92  Temp: 37.1 C  Resp: 18    Complications: No apparent anesthesia complications

## 2015-05-16 NOTE — H&P (Signed)
Primary Care Physician:  Golden Pop, MD  Pre-Procedure History & Physical: HPI:  Robin Graves is a 74 y.o. female is here for an endoscopy/ colonoscopy   Past Medical History  Diagnosis Date  . Thyroid disease   . Hypertension   . Anemia   . Hyperlipidemia   . COPD (chronic obstructive pulmonary disease) ([place])   . Emphysema of lung ([place])   . Headache     MIGRAINES, 1 EVERY DAY OR LESS OFTEN  . Stroke [facility])     [place]  . Arthritis     BACK  . Graves' disease with exophthalmos     REMOVED ON THYROID MEDS  . GERD (gastroesophageal reflux disease)   . CAD (coronary artery disease)     DR PARASCHO EVERY 6 MOS. SAYS SHE IS DOING FINE  . Wears dentures     UPPER AND LOWER  . Graves disease     Past Surgical History  Procedure Laterality Date  . Cholecystectomy    . Abdominal hysterectomy  1974  . Breast surgery Bilateral , 1973    history of leakage  . Back surgery  JUNE,4,2015    HURT AT WORK  . Cataract extraction w/phaco Right 04/20/2015    Procedure: CATARACT EXTRACTION PHACO AND INTRAOCULAR LENS PLACEMENT (IOC);  Surgeon: Leandrew Koyanagi, MD;  Location: [place];  Service: Ophthalmology;  Laterality: Right;  . Mastectomy      Prior to Admission medications   Medication Sig Start Date End Date Taking? Authorizing Provider  ketorolac (ACULAR) 0.4 % SOLN 1 drop 4 (four) times daily.   Yes Historical Provider, MD  moxifloxacin (VIGAMOX) 0.5 % ophthalmic solution 1 drop 3 (three) times daily.   Yes Historical Provider, MD  aspirin EC 81 MG tablet Take 81 mg by mouth daily. AM    Historical Provider, MD  cyanocobalamin (,VITAMIN B-12,) 1000 MCG/ML injection Inject 1,000 mcg into the muscle every 30 (thirty) days.    Historical Provider, MD  ibuprofen (ADVIL,MOTRIN) 800 MG tablet Take 1 tablet (800 mg total) by mouth 3 (three) times daily as needed. Patient taking differently: Take 800 mg by mouth daily. AM 01/06/15   Guadalupe Maple, MD  levothyroxine (SYNTHROID, LEVOTHROID) 100 MCG tablet Take 1 tablet (100 mcg total) by mouth daily before breakfast. 05/16/15   Guadalupe Maple, MD  omeprazole (PRILOSEC) 20 MG capsule Take 20 mg by mouth 2 (two) times daily before a meal. AM AN PM    Historical Provider, MD  pravastatin (PRAVACHOL) 80 MG tablet Take 1 tablet (80 mg total) by mouth daily. Patient taking differently: Take 80 mg by mouth daily. PM 01/06/15   Guadalupe Maple, MD    Allergies as of 05/13/2015 - Review Complete 04/20/2015  Allergen Reaction Noted  . Codeine  05/13/2015    Family History  Problem Relation Age of Onset  . Coronary artery disease Mother   . Stroke Father   . Heart attack Father   . Cancer Brother     colon  . Stroke Brother   . Heart attack Brother   . Diabetes Brother     Social History   Social History  . Marital Status: Married    Spouse Name: N/A  . Number of Children: N/A  . Years of Education: N/A   Occupational History  . Not on file.   Social History Main Topics  . Smoking status: Former Smoker -- 0.25 packs/day for 4  years    Types: Cigarettes    Quit date: 04/23/2008  . Smokeless tobacco: Never Used  . Alcohol Use: No  . Drug Use: No  . Sexual Activity: Not on file   Other Topics Concern  . Not on file   Social History Narrative     Physical Exam: BP 169/77 mmHg  Pulse 95  Temp(Src) 101.3 F (38.5 C) (Tympanic)  Resp 18  Ht 4\' 11"  (1.499 m)  Wt 58.514 kg (129 lb)  BMI 26.04 kg/m2  SpO2 97% General:   Alert,  pleasant and cooperative in NAD Head:  Normocephalic and atraumatic. Neck:  Supple; no masses or thyromegaly. Lungs:  Clear throughout to auscultation.    Heart:  Regular rate and rhythm. Abdomen:  Soft, nontender and nondistended. Normal bowel sounds, without guarding, and without rebound.   Neurologic:  Alert and  oriented x4;  grossly normal neurologically.  Impression/Plan: Robin Graves is here for an endoscopy to be performed  for anemia, colon for diarrhea and anemia  Risks, benefits, limitations, and alternatives regarding  Endoscopy/colonoscpy have been reviewed with the patient.  Questions have been answered.  All parties agreeable.   Josefine Class, MD  05/16/2015, 3:43 PM

## 2015-05-16 NOTE — Op Note (Signed)
[facility] Gastroenterology Patient Name: Robin Graves Procedure Date: 05/16/2015 3:46 PM MRN: [id number] Account #: [id number] Date of Birth: 04/05/1941 Admit Type: Outpatient Age: 74 Room: [facility] ENDO ROOM 2 Gender: Female Note Status: Finalized Procedure:         Upper GI endoscopy Indications:       Anemia Patient Profile:   This is a 74 year old female. Providers:         Gerrit Heck. Rayann Heman, MD Referring MD:      Guadalupe Maple, MD (Referring MD) Medicines:         Propofol per Anesthesia Complications:     No immediate complications. Procedure:         Pre-Anesthesia Assessment:                    - Prior to the procedure, a History and Physical was                     performed, and patient medications, allergies and                     sensitivities were reviewed. The patient's tolerance of                     previous anesthesia was reviewed.                    After obtaining informed consent, the endoscope was passed                     under direct vision. Throughout the procedure, the                     patient's blood pressure, pulse, and oxygen saturations                     were monitored continuously. The Endoscope was introduced                     through the mouth, and advanced to the second part of                     duodenum. The upper GI endoscopy was accomplished without                     difficulty. The patient tolerated the procedure well. Findings:      A small hiatus hernia was present.      The stomach was normal.      The examined duodenum was normal. Impression:        - Small hiatus hernia.                    - Normal stomach.                    - Normal examined duodenum.                    - No specimens collected. Recommendation:    - Perform a colonoscopy today.                    - Continue present medications.                    - The findings and recommendations were discussed with the  patient.                - The findings and recommendations were discussed with the                     patient's family. Procedure Code(s): --- Professional ---                    [phone], Esophagogastroduodenoscopy, flexible, transoral;                     diagnostic, including collection of specimen(s) by                     brushing or washing, when performed (separate procedure) CPT copyright 2014 American Medical Association. All rights reserved. The codes documented in this report are preliminary and upon coder review may  be revised to meet current compliance requirements. Mellody Life, MD 05/16/2015 3:58:17 PM This report has been signed electronically. Number of Addenda: 0 Note Initiated On: 05/16/2015 3:46 PM      [facility]

## 2015-05-17 ENCOUNTER — Encounter: Payer: Self-pay | Admitting: Gastroenterology

## 2015-05-18 LAB — SURGICAL PATHOLOGY

## 2015-06-30 ENCOUNTER — Ambulatory Visit (INDEPENDENT_AMBULATORY_CARE_PROVIDER_SITE_OTHER): Payer: Medicare Other | Admitting: Family Medicine

## 2015-06-30 ENCOUNTER — Encounter: Payer: Self-pay | Admitting: Family Medicine

## 2015-06-30 VITALS — BP 108/69 | HR 101 | Temp 98.6°F | Ht <= 58 in | Wt 118.0 lb

## 2015-06-30 DIAGNOSIS — I10 Essential (primary) hypertension: Secondary | ICD-10-CM

## 2015-06-30 DIAGNOSIS — E890 Postprocedural hypothyroidism: Secondary | ICD-10-CM

## 2015-06-30 DIAGNOSIS — I2583 Coronary atherosclerosis due to lipid rich plaque: Secondary | ICD-10-CM

## 2015-06-30 DIAGNOSIS — D510 Vitamin B12 deficiency anemia due to intrinsic factor deficiency: Secondary | ICD-10-CM | POA: Diagnosis not present

## 2015-06-30 DIAGNOSIS — I2510 Atherosclerotic heart disease of native coronary artery without angina pectoris: Secondary | ICD-10-CM

## 2015-06-30 DIAGNOSIS — E785 Hyperlipidemia, unspecified: Secondary | ICD-10-CM | POA: Diagnosis not present

## 2015-06-30 MED ORDER — FERROUS SULFATE 325 (65 FE) MG PO TABS: 325.0000 mg | ORAL_TABLET | Freq: Every day | ORAL | Status: DC

## 2015-06-30 MED ORDER — PRAVASTATIN SODIUM 80 MG PO TABS: 80.0000 mg | ORAL_TABLET | Freq: Every day | ORAL | Status: DC

## 2015-06-30 MED ORDER — LEVOTHYROXINE SODIUM 100 MCG PO TABS: 100.0000 ug | ORAL_TABLET | Freq: Every day | ORAL | Status: DC

## 2015-06-30 NOTE — Assessment & Plan Note (Signed)
No sx 

## 2015-06-30 NOTE — Assessment & Plan Note (Signed)
The current medical regimen is effective;  continue present plan and medications.  

## 2015-06-30 NOTE — Progress Notes (Signed)
BP 108/69 mmHg  Pulse 101  Temp(Src) 98.6 F (37 C)  Ht 4' 8.7" (1.44 m)  Wt 118 lb (53.524 kg)  BMI 25.81 kg/m2  SpO2 95%   Subjective:    Patient ID: Robin Graves, female    DOB: May 03, 1941, 74 y.o.   MRN: [id number]  HPI: Robin Graves is a 74 y.o. female  Chief Complaint  Patient presents with  . too early for CPE   Patient follow-up medication has been doing well no complaints Reviewed notes and patient stable Taking thyroid and cholesterol medicine without problems at [facility] clinic had colonoscopy which is reported as normal and no further follow-up scheduled also was seen at [facility] eye and had good report.  Relevant past medical, surgical, family and social history reviewed and updated as indicated. Interim medical history since our last visit reviewed. Allergies and medications reviewed and updated.  Review of Systems  Constitutional: Negative.   Cardiovascular: Negative.     Per HPI unless specifically indicated above     Objective:    BP 108/69 mmHg  Pulse 101  Temp(Src) 98.6 F (37 C)  Ht 4' 8.7" (1.44 m)  Wt 118 lb (53.524 kg)  BMI 25.81 kg/m2  SpO2 95%  Wt Readings from Last 3 Encounters:  06/30/15 118 lb (53.524 kg)  05/16/15 129 lb (58.514 kg)  04/20/15 129 lb 12.8 oz (58.877 kg)    Physical Exam  Constitutional: She is oriented to person, place, and time. She appears well-developed and well-nourished. No distress.  HENT:  Head: Normocephalic and atraumatic.  Right Ear: Hearing normal.  Left Ear: Hearing normal.  Nose: Nose normal.  Eyes: Conjunctivae and lids are normal. Right eye exhibits no discharge. Left eye exhibits no discharge. No scleral icterus.  Cardiovascular: Normal rate, regular rhythm and normal heart sounds.   Pulmonary/Chest: Effort normal and breath sounds normal. No respiratory distress.  Musculoskeletal: Normal range of motion.  Neurological: She is alert and oriented to person, place, and time.  Skin: Skin is intact.  No rash noted.  Psychiatric: She has a normal mood and affect. Her speech is normal and behavior is normal. Judgment and thought content normal. Cognition and memory are normal.    Results for orders placed or performed during the hospital encounter of 05/16/15  Surgical pathology  Result Value Ref Range   SURGICAL PATHOLOGY      Surgical Pathology CASE: [id number] PATIENT: Robin Graves Surgical Pathology Report     SPECIMEN SUBMITTED: A. Colon polyp, cecum, hot snare B. Colon, random bx's  CLINICAL HISTORY: None provided  PRE-OPERATIVE DIAGNOSIS: Anemia, diarrhea  POST-OPERATIVE DIAGNOSIS: Hiatal hernia, colon polyps, AVM's of the colon-treated with cautery     DIAGNOSIS: A. COLON POLYP, CECUM; HOT SNARE: - TUBULAR ADENOMA, 4 FRAGMENTS. - NEGATIVE FOR HIGH-GRADE DYSPLASIA AND MALIGNANCY.  B. COLON; RANDOM BIOPSIES: - NEGATIVE FOR COLITIS.  Comment: In part B, the crypt architecture is intact and there is no active inflammation. There is no subepithelial collagen deposition or intraepithelial lymphocytosis. No viral cytopathic effects or granulomas are identified.   GROSS DESCRIPTION: A. Labeled: hot snare cecal polyp Tissue fragment(s): 4 Size: 0.1-0.3 cm Description: Tan  Entirely submitted in one cassette(s).   B. Labeled: random colon biopsies Tissue fragment (s): multiple Size: aggregate, 0.9 x 0.6 x 0.1 cm Description: tan fragments  Entirely submitted in one cassette(s).  Final Diagnosis performed by Bryan Lemma, MD.  Electronically signed 05/18/2015 4:06:01PM    The electronic signature indicates that the  named Attending Pathologist has evaluated the specimen  Technical component performed at [facility], [address] [place], [place] [address] Lab: [phone] Dir: Darrick Penna. Evette Doffing, MD  Professional component performed at [facility], [facility], [place], [place], [place] [address] Lab: [phone] Dir: Dellia Nims. Rubinas, MD        Assessment & Plan:   Problem List Items Addressed This Visit      Cardiovascular and Mediastinum   Hypertension    Diet controled      Relevant Medications   pravastatin (PRAVACHOL) 80 MG tablet   Other Relevant Orders   CBC with Differential/Platelet   CAD (coronary artery disease)    No sx      Relevant Medications   pravastatin (PRAVACHOL) 80 MG tablet     Endocrine   Hypothyroidism   Relevant Medications   levothyroxine (SYNTHROID, LEVOTHROID) 100 MCG tablet     Other   Pernicious anemia   Relevant Medications   ferrous sulfate 325 (65 FE) MG tablet   Hyperlipidemia - Primary    The current medical regimen is effective;  continue present plan and medications.       Relevant Medications   pravastatin (PRAVACHOL) 80 MG tablet   Other Relevant Orders   CBC with Differential/Platelet   Lipid Panel w/o Chol/HDL Ratio   ALT   AST       Follow up plan: Return in about 3 months (around 09/28/2015) for Physical Exam.

## 2015-06-30 NOTE — Assessment & Plan Note (Signed)
Diet controled 

## 2015-07-01 ENCOUNTER — Encounter: Payer: Self-pay | Admitting: Family Medicine

## 2015-07-01 LAB — CBC WITH DIFFERENTIAL/PLATELET
BASOS: 0 %
Basophils Absolute: 0 10*3/uL (ref 0.0–0.2)
EOS (ABSOLUTE): 0 10*3/uL (ref 0.0–0.4)
Eos: 0 %
HEMOGLOBIN: 11.5 g/dL (ref 11.1–15.9)
Hematocrit: 35.7 % (ref 34.0–46.6)
IMMATURE GRANULOCYTES: 0 %
Immature Grans (Abs): 0 10*3/uL (ref 0.0–0.1)
LYMPHS: 20 %
Lymphocytes Absolute: 1 10*3/uL (ref 0.7–3.1)
MCH: 28.4 pg (ref 26.6–33.0)
MCHC: 32.2 g/dL (ref 31.5–35.7)
MCV: 88 fL (ref 79–97)
MONOCYTES: 9 %
Monocytes Absolute: 0.4 10*3/uL (ref 0.1–0.9)
NEUTROS ABS: 3.6 10*3/uL (ref 1.4–7.0)
Neutrophils: 71 %
Platelets: 162 10*3/uL (ref 150–379)
RBC: 4.05 x10E6/uL (ref 3.77–5.28)
RDW: 16.9 % — ABNORMAL HIGH (ref 12.3–15.4)
WBC: 5.1 10*3/uL (ref 3.4–10.8)

## 2015-07-01 LAB — ALT: ALT: 6 IU/L (ref 0–32)

## 2015-07-01 LAB — LIPID PANEL W/O CHOL/HDL RATIO
CHOLESTEROL TOTAL: 140 mg/dL (ref 100–199)
HDL: 50 mg/dL (ref >39)
LDL CALC: 75 mg/dL (ref 0–99)
Triglycerides: 76 mg/dL (ref 0–149)
VLDL CHOLESTEROL CAL: 15 mg/dL (ref 5–40)

## 2015-07-01 LAB — AST: AST: 12 IU/L (ref 0–40)

## 2015-08-27 ENCOUNTER — Other Ambulatory Visit: Payer: Self-pay | Admitting: Family Medicine

## 2015-08-27 NOTE — Telephone Encounter (Signed)
apt 

## 2015-08-29 ENCOUNTER — Encounter: Payer: Self-pay | Admitting: Family Medicine

## 2015-09-01 DIAGNOSIS — R0602 Shortness of breath: Secondary | ICD-10-CM | POA: Insufficient documentation

## 2015-09-28 NOTE — Telephone Encounter (Signed)
Appt made 10/06/15 @ 1045.

## 2015-10-01 ENCOUNTER — Other Ambulatory Visit: Payer: Self-pay | Admitting: Family Medicine

## 2015-10-06 ENCOUNTER — Ambulatory Visit: Payer: Self-pay | Admitting: Family Medicine

## 2015-12-08 ENCOUNTER — Other Ambulatory Visit: Payer: Self-pay | Admitting: Internal Medicine

## 2015-12-08 DIAGNOSIS — E890 Postprocedural hypothyroidism: Secondary | ICD-10-CM | POA: Insufficient documentation

## 2015-12-08 DIAGNOSIS — Z1231 Encounter for screening mammogram for malignant neoplasm of breast: Secondary | ICD-10-CM

## 2015-12-22 ENCOUNTER — Ambulatory Visit
Admission: RE | Admit: 2015-12-22 | Discharge: 2015-12-22 | Disposition: A | Discharge status: Home / Self Care | Payer: Medicare Other | Source: Ambulatory Visit | Attending: Internal Medicine | Admitting: Internal Medicine

## 2015-12-22 DIAGNOSIS — Z1231 Encounter for screening mammogram for malignant neoplasm of breast: Secondary | ICD-10-CM | POA: Diagnosis present

## 2016-02-22 DIAGNOSIS — Z8639 Personal history of other endocrine, nutritional and metabolic disease: Secondary | ICD-10-CM | POA: Insufficient documentation

## 2016-02-24 ENCOUNTER — Other Ambulatory Visit
Admission: RE | Admit: 2016-02-24 | Discharge: 2016-02-24 | Disposition: A | Discharge status: Home / Self Care | Payer: Medicare Other | Source: Ambulatory Visit | Attending: Student | Admitting: Student

## 2016-02-24 DIAGNOSIS — R197 Diarrhea, unspecified: Secondary | ICD-10-CM | POA: Insufficient documentation

## 2016-02-24 LAB — GASTROINTESTINAL PANEL BY PCR, STOOL (REPLACES STOOL CULTURE)

## 2016-02-24 LAB — C DIFFICILE QUICK SCREEN W PCR REFLEX
C DIFFICILE (CDIFF) INTERP: NOT DETECTED
C DIFFICILE (CDIFF) TOXIN: NEGATIVE
C DIFFICLE (CDIFF) ANTIGEN: NEGATIVE

## 2016-03-01 DIAGNOSIS — Z9889 Other specified postprocedural states: Secondary | ICD-10-CM | POA: Insufficient documentation

## 2016-08-15 ENCOUNTER — Other Ambulatory Visit: Payer: Self-pay | Admitting: Gastroenterology

## 2016-08-15 DIAGNOSIS — R1906 Epigastric swelling, mass or lump: Secondary | ICD-10-CM

## 2016-08-15 DIAGNOSIS — R6881 Early satiety: Secondary | ICD-10-CM

## 2016-08-22 ENCOUNTER — Ambulatory Visit
Admission: RE | Admit: 2016-08-22 | Discharge: 2016-08-22 | Disposition: A | Discharge status: Home / Self Care | Payer: Medicare Other | Source: Ambulatory Visit | Attending: Gastroenterology | Admitting: Gastroenterology

## 2016-08-22 DIAGNOSIS — R1906 Epigastric swelling, mass or lump: Secondary | ICD-10-CM

## 2016-08-22 DIAGNOSIS — R6881 Early satiety: Secondary | ICD-10-CM | POA: Diagnosis present

## 2016-08-22 MED ORDER — TECHNETIUM TC 99M SULFUR COLLOID
2.8540 | Freq: Once | INTRAVENOUS | Status: AC | PRN
  Administered 2016-08-22: 2.854 via ORAL

## 2016-08-22 MED ORDER — TECHNETIUM TC 99M SULFUR COLLOID: 2.8540 | Freq: Once | INTRAVENOUS | Status: DC | PRN

## 2016-08-27 DIAGNOSIS — F17200 Nicotine dependence, unspecified, uncomplicated: Secondary | ICD-10-CM | POA: Insufficient documentation

## 2016-08-27 DIAGNOSIS — Z720 Tobacco use: Secondary | ICD-10-CM | POA: Insufficient documentation

## 2016-10-15 ENCOUNTER — Encounter: Payer: Self-pay | Admitting: Emergency Medicine

## 2016-10-15 ENCOUNTER — Emergency Department
Admission: EM | Admit: 2016-10-15 | Discharge: 2016-10-15 | Disposition: A | Discharge status: Home / Self Care | Payer: Medicare Other | Attending: Emergency Medicine | Admitting: Emergency Medicine

## 2016-10-15 DIAGNOSIS — Z87891 Personal history of nicotine dependence: Secondary | ICD-10-CM | POA: Insufficient documentation

## 2016-10-15 DIAGNOSIS — Y9222 Religious institution as the place of occurrence of the external cause: Secondary | ICD-10-CM | POA: Insufficient documentation

## 2016-10-15 DIAGNOSIS — Z7982 Long term (current) use of aspirin: Secondary | ICD-10-CM | POA: Diagnosis not present

## 2016-10-15 DIAGNOSIS — E039 Hypothyroidism, unspecified: Secondary | ICD-10-CM | POA: Insufficient documentation

## 2016-10-15 DIAGNOSIS — M545 Low back pain: Secondary | ICD-10-CM | POA: Diagnosis present

## 2016-10-15 DIAGNOSIS — I2510 Atherosclerotic heart disease of native coronary artery without angina pectoris: Secondary | ICD-10-CM | POA: Diagnosis not present

## 2016-10-15 DIAGNOSIS — Y939 Activity, unspecified: Secondary | ICD-10-CM | POA: Insufficient documentation

## 2016-10-15 DIAGNOSIS — Y999 Unspecified external cause status: Secondary | ICD-10-CM | POA: Insufficient documentation

## 2016-10-15 DIAGNOSIS — Z79899 Other long term (current) drug therapy: Secondary | ICD-10-CM | POA: Insufficient documentation

## 2016-10-15 DIAGNOSIS — S39012A Strain of muscle, fascia and tendon of lower back, initial encounter: Secondary | ICD-10-CM | POA: Diagnosis not present

## 2016-10-15 DIAGNOSIS — J449 Chronic obstructive pulmonary disease, unspecified: Secondary | ICD-10-CM | POA: Insufficient documentation

## 2016-10-15 DIAGNOSIS — X58XXXA Exposure to other specified factors, initial encounter: Secondary | ICD-10-CM | POA: Insufficient documentation

## 2016-10-15 DIAGNOSIS — I10 Essential (primary) hypertension: Secondary | ICD-10-CM | POA: Diagnosis not present

## 2016-10-15 LAB — URINALYSIS, COMPLETE (UACMP) WITH MICROSCOPIC
BILIRUBIN URINE: NEGATIVE
GLUCOSE, UA: NEGATIVE mg/dL
HGB URINE DIPSTICK: NEGATIVE
KETONES UR: NEGATIVE mg/dL
Nitrite: NEGATIVE
PH: 5 (ref 5.0–8.0)
PROTEIN: 100 mg/dL — AB
Specific Gravity, Urine: 1.026 (ref 1.005–1.030)

## 2016-10-15 MED ORDER — TRAMADOL HCL 50 MG PO TABS: ORAL_TABLET | ORAL | 0 refills | Status: DC

## 2016-10-15 MED ORDER — TRAMADOL HCL 50 MG PO TABS
50.0000 mg | ORAL_TABLET | Freq: Once | ORAL | Status: AC
  Administered 2016-10-15: 50 mg via ORAL
  Filled 2016-10-15: qty 1

## 2016-10-15 NOTE — ED Provider Notes (Signed)
[facility] Emergency Department Provider Note   ____________________________________________   First MD Initiated Contact with Patient 10/15/16 1559     (approximate)  I have reviewed the triage vital signs and the nursing notes.   HISTORY  Chief Complaint Back Pain   HPI Robin Graves is a 76 y.o. female is here complaining of left-sided back pain. Patient states that she was sitting in church when pain started. There is no history of injury. Patient relates that she has had back surgery with the last being in 2015. She denies any nausea, vomiting, fever or chills. She denies any urinary symptoms. She denies any past history of kidney stones. She denies any incontinence of bowel or bladder or saddle paresthesias. Patient took ibuprofen 800 mg without any relief of her back pain.Patient states that she has seen Dr. Rudene Christians in the past and was made aware that she would need another surgery in the future. Patient states the pain is worse with range of motion especially when bending. She rates her pain as a 10 over 10.   Past Medical History:  Diagnosis Date  . Anemia   . Arthritis    BACK  . COPD (chronic obstructive pulmonary disease) ([place])   . Emphysema of lung ([place])   . GERD (gastroesophageal reflux disease)   . Graves' disease with exophthalmos    REMOVED ON THYROID MEDS  . Headache    MIGRAINES, 1 EVERY DAY OR LESS OFTEN  . Hyperlipidemia   . Hypertension   . Stroke [facility])    [place]  . Wears dentures    UPPER AND LOWER    Patient Active Problem List   Diagnosis Date Noted  . Pernicious anemia 06/30/2015  . Hypertension 01/06/2015  . CAD (coronary artery disease) 01/06/2015  . Hypothyroidism 01/06/2015  . Hyperlipidemia 01/06/2015    Past Surgical History:  Procedure Laterality Date  . ABDOMINAL HYSTERECTOMY  1974  . AUGMENTATION MAMMAPLASTY Bilateral 9390   silicone  . AUGMENTATION MAMMAPLASTY Bilateral 1996     saline  . BACK SURGERY  JUNE,4,2015   HURT AT WORK  . BREAST SURGERY Bilateral , 1973   history of leakage  . CATARACT EXTRACTION W/PHACO Right 04/20/2015   Procedure: CATARACT EXTRACTION PHACO AND INTRAOCULAR LENS PLACEMENT (IOC);  Surgeon: Leandrew Koyanagi, MD;  Location: [place];  Service: Ophthalmology;  Laterality: Right;  . CHOLECYSTECTOMY    . COLONOSCOPY WITH PROPOFOL N/A 05/16/2015   Procedure: COLONOSCOPY WITH PROPOFOL;  Surgeon: Josefine Class, MD;  Location: [facility] ENDOSCOPY;  Service: Endoscopy;  Laterality: N/A;  . ESOPHAGOGASTRODUODENOSCOPY (EGD) WITH PROPOFOL N/A 05/16/2015   Procedure: ESOPHAGOGASTRODUODENOSCOPY (EGD) WITH PROPOFOL;  Surgeon: Josefine Class, MD;  Location: [facility] ENDOSCOPY;  Service: Endoscopy;  Laterality: N/A;  . EYE SURGERY    . MASTECTOMY Bilateral 1970's   subcutaneous mastectomy done for preventative reasons with reconstruction    Prior to Admission medications   Medication Sig Start Date End Date Taking? Authorizing Provider  aspirin EC 81 MG tablet Take 81 mg by mouth daily. AM    Historical Provider, MD  cyanocobalamin (,VITAMIN B-12,) 1000 MCG/ML injection INJECT 1 ML AS DIRECTED MONTHLY 10/03/15   Guadalupe Maple, MD  ferrous sulfate 325 (65 FE) MG tablet Take 1 tablet (325 mg total) by mouth daily with breakfast. 06/30/15   Guadalupe Maple, MD  ibuprofen (ADVIL,MOTRIN) 800 MG tablet TAKE ONE TABLET BY MOUTH THREE TIMES DAILY AS NEEDED 08/27/15  Guadalupe Maple, MD  levothyroxine (SYNTHROID, LEVOTHROID) 100 MCG tablet Take 1 tablet (100 mcg total) by mouth daily before breakfast. 06/30/15   Guadalupe Maple, MD  omeprazole (PRILOSEC) 20 MG capsule Take 20 mg by mouth 2 (two) times daily before a meal. AM AN PM    Historical Provider, MD  pravastatin (PRAVACHOL) 80 MG tablet Take 1 tablet (80 mg total) by mouth daily. 06/30/15   Guadalupe Maple, MD  traMADol Veatrice Bourbon) 50 MG tablet Take 1 tablet every 6-8 hours prn pain 10/15/16    Johnn Hai, PA-C    Allergies Codeine  Family History  Problem Relation Age of Onset  . Coronary artery disease Mother   . Stroke Father   . Heart attack Father   . Cancer Brother     colon  . Stroke Brother   . Heart attack Brother   . Diabetes Brother     Social History Social History  Substance Use Topics  . Smoking status: Former Smoker    Packs/day: 0.25    Years: 4.00    Types: Cigarettes    Quit date: 04/23/2008  . Smokeless tobacco: Never Used  . Alcohol use No    Review of Systems Constitutional: No fever/chills ENT: No sore throat. Cardiovascular: Denies chest pain. Respiratory: Denies shortness of breath. Gastrointestinal: No abdominal pain.  No nausea, no vomiting.  No diarrhea.  No constipation. Genitourinary: Negative for dysuria. Musculoskeletal: Positive for back pain. Skin: Negative for rash. Neurological: Negative for headaches, focal weakness or numbness.  10-point ROS otherwise negative.  ____________________________________________   PHYSICAL EXAM:  VITAL SIGNS: ED Triage Vitals  Enc Vitals Group     BP 10/15/16 1530 (!) 144/78     Pulse Rate 10/15/16 1530 91     Resp 10/15/16 1530 20     Temp 10/15/16 1530 98 F (36.7 C)     Temp Source 10/15/16 1530 Oral     SpO2 10/15/16 1530 94 %     Weight 10/15/16 1531 122 lb (55.3 kg)     Height 10/15/16 1531 4\' 10"  (1.473 m)     Head Circumference --      Peak Flow --      Pain Score 10/15/16 1529 10     Pain Loc --      Pain Edu? --      Excl. in [place]? --     Constitutional: Alert and oriented. Well appearing and in no acute distress. Eyes: Conjunctivae are normal. PERRL. EOMI. Head: Atraumatic. Nose: No congestion/rhinnorhea. Neck: No stridor.   Cardiovascular: Normal rate, regular rhythm. Grossly normal heart sounds.  Good peripheral circulation. Respiratory: Normal respiratory effort.  No retractions. Lungs CTAB. Gastrointestinal: Soft and nontender. No distention. No CVA  tenderness. Bowel sounds normoactive 4 quadrants. Musculoskeletal: Examination of the back there is tenderness on palpation of the left paravertebral muscles with decreased range of motion. No gross deformity was noted. There is some minimal tenderness on palpation midline lumbosacral spine.SLR increase patient's pain on the left with movement also increases muscle spasms. Neurologic:  Normal speech and language. No gross focal neurologic deficits are appreciated. No gait instability. Skin:  Skin is warm, dry and intact. No rash noted. Psychiatric: Mood and affect are normal. Speech and behavior are normal.  ____________________________________________   LABS (all labs ordered are listed, but only abnormal results are displayed)  Labs Reviewed  URINALYSIS, COMPLETE (UACMP) WITH MICROSCOPIC - Abnormal; Notable for the following:  Result Value   Color, Urine YELLOW (*)    APPearance HAZY (*)    Protein, ur 100 (*)    Leukocytes, UA MODERATE (*)    Bacteria, UA RARE (*)    Squamous Epithelial / LPF 6-30 (*)    All other components within normal limits  URINE CULTURE    RADIOLOGY  Deferred. ____________________________________________   PROCEDURES  Procedure(s) performed: None  Procedures  Critical Care performed: No  ____________________________________________   INITIAL IMPRESSION / ASSESSMENT AND PLAN / ED COURSE  Pertinent labs & imaging results that were available during my care of the patient were reviewed by me and considered in my medical decision making (see chart for details).  Patient is given prescription for tramadol with instructions to take one every 6-8 hours as needed for pain. She is aware this medication could cause drowsiness increase her risk for falling. She will follow-up with her PCP for any continued problems and also consider calling Dr. Rudene Christians to read her last back surgery. Patient was also called with results of her urinalysis and Keflex  father milligrams 3 times a day was called into [facility] on [address].      ____________________________________________   FINAL CLINICAL IMPRESSION(S) / ED DIAGNOSES  Final diagnoses:  Strain of lumbar region, initial encounter      NEW MEDICATIONS STARTED DURING THIS VISIT:  Discharge Medication List as of 10/15/2016  5:07 PM    START taking these medications   Details  traMADol (ULTRAM) 50 MG tablet Take 1 tablet every 6-8 hours prn pain, Print         Note:  This document was prepared using Dragon voice recognition software and may include unintentional dictation errors.    Johnn Hai, PA-C 10/15/16 1751    Earleen Newport, MD 10/16/16 [id number]

## 2016-10-15 NOTE — Discharge Instructions (Signed)
Follow-up with your care doctor at [facility] if any continued problems. Tramadol 1 every 6-8 hours as needed for pain. Be aware that this medication could cause drowsiness increase your risk for falling. You may try moist heat or ice to her back as needed for comfort. Discontinue taking ibuprofen at this time.

## 2016-10-15 NOTE — ED Triage Notes (Signed)
Pt in via POV with complaints of lower back pain since yesterday while sitting in church.  Pt reports hx of back surgery in 2015; states, "Dr. Rudene Christians said I would need another surgery but never did another one."  Pt denies any urinary symptoms.  NAD noted at this time.

## 2016-10-15 NOTE — ED Notes (Signed)
Gave pt blanket

## 2016-10-23 ENCOUNTER — Other Ambulatory Visit: Payer: Self-pay | Admitting: Orthopedic Surgery

## 2016-10-23 DIAGNOSIS — M546 Pain in thoracic spine: Secondary | ICD-10-CM

## 2016-10-23 DIAGNOSIS — M545 Low back pain, unspecified: Secondary | ICD-10-CM

## 2016-11-01 ENCOUNTER — Ambulatory Visit
Admission: RE | Admit: 2016-11-01 | Discharge: 2016-11-01 | Disposition: A | Discharge status: Home / Self Care | Payer: Medicare Other | Source: Ambulatory Visit | Attending: Orthopedic Surgery | Admitting: Orthopedic Surgery

## 2016-11-01 DIAGNOSIS — M546 Pain in thoracic spine: Secondary | ICD-10-CM

## 2016-11-01 DIAGNOSIS — M545 Low back pain, unspecified: Secondary | ICD-10-CM

## 2016-11-01 DIAGNOSIS — M5136 Other intervertebral disc degeneration, lumbar region: Secondary | ICD-10-CM | POA: Diagnosis not present

## 2016-11-01 DIAGNOSIS — M4854XA Collapsed vertebra, not elsewhere classified, thoracic region, initial encounter for fracture: Secondary | ICD-10-CM | POA: Insufficient documentation

## 2016-11-01 DIAGNOSIS — M5126 Other intervertebral disc displacement, lumbar region: Secondary | ICD-10-CM | POA: Diagnosis not present

## 2016-11-01 DIAGNOSIS — M4856XA Collapsed vertebra, not elsewhere classified, lumbar region, initial encounter for fracture: Secondary | ICD-10-CM | POA: Insufficient documentation

## 2016-11-06 ENCOUNTER — Encounter
Admission: RE | Admit: 2016-11-06 | Discharge: 2016-11-06 | Disposition: A | Discharge status: Home / Self Care | Payer: Medicare Other | Source: Ambulatory Visit | Attending: Orthopedic Surgery | Admitting: Orthopedic Surgery

## 2016-11-06 DIAGNOSIS — Z9013 Acquired absence of bilateral breasts and nipples: Secondary | ICD-10-CM | POA: Diagnosis not present

## 2016-11-06 DIAGNOSIS — M4854XA Collapsed vertebra, not elsewhere classified, thoracic region, initial encounter for fracture: Secondary | ICD-10-CM | POA: Diagnosis not present

## 2016-11-06 DIAGNOSIS — Z8261 Family history of arthritis: Secondary | ICD-10-CM | POA: Diagnosis not present

## 2016-11-06 DIAGNOSIS — E785 Hyperlipidemia, unspecified: Secondary | ICD-10-CM | POA: Diagnosis not present

## 2016-11-06 DIAGNOSIS — D510 Vitamin B12 deficiency anemia due to intrinsic factor deficiency: Secondary | ICD-10-CM | POA: Diagnosis not present

## 2016-11-06 DIAGNOSIS — I10 Essential (primary) hypertension: Secondary | ICD-10-CM | POA: Diagnosis not present

## 2016-11-06 DIAGNOSIS — Z8673 Personal history of transient ischemic attack (TIA), and cerebral infarction without residual deficits: Secondary | ICD-10-CM | POA: Diagnosis not present

## 2016-11-06 DIAGNOSIS — I2510 Atherosclerotic heart disease of native coronary artery without angina pectoris: Secondary | ICD-10-CM | POA: Diagnosis not present

## 2016-11-06 DIAGNOSIS — K219 Gastro-esophageal reflux disease without esophagitis: Secondary | ICD-10-CM | POA: Diagnosis not present

## 2016-11-06 DIAGNOSIS — Z7982 Long term (current) use of aspirin: Secondary | ICD-10-CM | POA: Diagnosis not present

## 2016-11-06 DIAGNOSIS — Z90710 Acquired absence of both cervix and uterus: Secondary | ICD-10-CM | POA: Diagnosis not present

## 2016-11-06 DIAGNOSIS — J449 Chronic obstructive pulmonary disease, unspecified: Secondary | ICD-10-CM | POA: Diagnosis not present

## 2016-11-06 DIAGNOSIS — Z9889 Other specified postprocedural states: Secondary | ICD-10-CM | POA: Diagnosis not present

## 2016-11-06 DIAGNOSIS — K5790 Diverticulosis of intestine, part unspecified, without perforation or abscess without bleeding: Secondary | ICD-10-CM | POA: Diagnosis not present

## 2016-11-06 DIAGNOSIS — Z87891 Personal history of nicotine dependence: Secondary | ICD-10-CM | POA: Diagnosis not present

## 2016-11-06 DIAGNOSIS — Z823 Family history of stroke: Secondary | ICD-10-CM | POA: Diagnosis not present

## 2016-11-06 DIAGNOSIS — M1990 Unspecified osteoarthritis, unspecified site: Secondary | ICD-10-CM | POA: Diagnosis not present

## 2016-11-06 DIAGNOSIS — E890 Postprocedural hypothyroidism: Secondary | ICD-10-CM | POA: Diagnosis not present

## 2016-11-06 DIAGNOSIS — Z791 Long term (current) use of non-steroidal anti-inflammatories (NSAID): Secondary | ICD-10-CM | POA: Diagnosis not present

## 2016-11-06 DIAGNOSIS — Z86010 Personal history of colonic polyps: Secondary | ICD-10-CM | POA: Diagnosis not present

## 2016-11-06 DIAGNOSIS — Z8249 Family history of ischemic heart disease and other diseases of the circulatory system: Secondary | ICD-10-CM | POA: Diagnosis not present

## 2016-11-06 DIAGNOSIS — Z8619 Personal history of other infectious and parasitic diseases: Secondary | ICD-10-CM | POA: Diagnosis not present

## 2016-11-06 DIAGNOSIS — Z9049 Acquired absence of other specified parts of digestive tract: Secondary | ICD-10-CM | POA: Diagnosis not present

## 2016-11-06 DIAGNOSIS — Z79899 Other long term (current) drug therapy: Secondary | ICD-10-CM | POA: Diagnosis not present

## 2016-11-06 HISTORY — DX: Vitamin B12 deficiency anemia due to intrinsic factor deficiency: D51.0

## 2016-11-06 HISTORY — DX: Polyp of colon: K63.5

## 2016-11-06 HISTORY — DX: Diverticulosis of intestine, part unspecified, without perforation or abscess without bleeding: K57.90

## 2016-11-06 HISTORY — DX: Varicella without complication: B01.9

## 2016-11-06 HISTORY — DX: Diaphragmatic hernia without obstruction or gangrene: K44.9

## 2016-11-06 NOTE — Patient Instructions (Signed)
  Your procedure is scheduled on: Thursday, Nov 08, 2016 Report to Same Day Surgery 2nd floor medical mall [facility] Entrance-take elevator on left to 2nd floor.  Check in with surgery information desk.) To find out your arrival time please call [phone] between 1PM - 3PM on Wednesday, May 2  Remember: Instructions that are not followed completely may result in serious medical risk, up to and including death, or upon the discretion of your surgeon and anesthesiologist your surgery may need to be rescheduled.    _x___ 1. Do not eat food or drink liquids after midnight. No gum chewing or hard candies.      __x__ 2. No Alcohol for 24 hours before or after surgery.   __x__3. No Smoking for 24 prior to surgery.   ____  4. Bring all medications with you on the day of surgery if instructed.    __x__ 5. Notify your doctor if there is any change in your medical condition     (cold, fever, infections).     Do not wear jewelry, make-up, hairpins, clips or nail polish.  Do not wear lotions, powders, or perfumes. You may wear deodorant.  Do not shave 48 hours prior to surgery. Men may shave face and neck.  Do not bring valuables to the hospital.    [facility] is not responsible for any belongings or valuables.               Contacts, dentures or bridgework may not be worn into surgery.   Patients discharged the day of surgery will not be allowed to drive home.  You will need someone to drive you home and stay with you the night of your procedure.    Please read over the following fact sheets that you were given:   [facility] Preparing for Surgery and or MRSA Information   _x___ Take this medication the morning of surgery with a SIP of water:   1. levothyroxine  2. omeprazole   _x___ Use CHG Soap or sage wipes as directed on instruction sheet   _x___ Follow recommendations from Cardiologist, Pulmonologist or PCP regarding stopping Aspirin.  X____Stop Anti-inflammatories such as  Advil, Aleve, Ibuprofen, Motrin, Naproxen, Naprosyn, Goodies powders or aspirin products. OK to take Tylenol and Celebrex.   _x___ Stop supplements until after surgery. (PROBIOTIC).

## 2016-11-07 NOTE — Pre-Procedure Instructions (Signed)
EKG VIEWED BY DR Rosey Bath AND OK TO PROCEED

## 2016-11-08 ENCOUNTER — Encounter
Admission: RE | Disposition: A | Discharge status: Home / Self Care | Payer: Self-pay | Source: Ambulatory Visit | Attending: Orthopedic Surgery

## 2016-11-08 ENCOUNTER — Ambulatory Visit
Admission: RE | Admit: 2016-11-08 | Discharge: 2016-11-08 | Disposition: A | Discharge status: Home / Self Care | Payer: Medicare Other | Source: Ambulatory Visit | Attending: Orthopedic Surgery | Admitting: Orthopedic Surgery

## 2016-11-08 ENCOUNTER — Ambulatory Visit: Payer: Medicare Other | Admitting: Anesthesiology

## 2016-11-08 ENCOUNTER — Ambulatory Visit: Payer: Medicare Other

## 2016-11-08 ENCOUNTER — Encounter: Payer: Self-pay | Admitting: Anesthesiology

## 2016-11-08 DIAGNOSIS — Z9013 Acquired absence of bilateral breasts and nipples: Secondary | ICD-10-CM | POA: Insufficient documentation

## 2016-11-08 DIAGNOSIS — Z8261 Family history of arthritis: Secondary | ICD-10-CM | POA: Insufficient documentation

## 2016-11-08 DIAGNOSIS — K219 Gastro-esophageal reflux disease without esophagitis: Secondary | ICD-10-CM | POA: Insufficient documentation

## 2016-11-08 DIAGNOSIS — J449 Chronic obstructive pulmonary disease, unspecified: Secondary | ICD-10-CM | POA: Insufficient documentation

## 2016-11-08 DIAGNOSIS — Z79899 Other long term (current) drug therapy: Secondary | ICD-10-CM | POA: Insufficient documentation

## 2016-11-08 DIAGNOSIS — E890 Postprocedural hypothyroidism: Secondary | ICD-10-CM | POA: Insufficient documentation

## 2016-11-08 DIAGNOSIS — Z8249 Family history of ischemic heart disease and other diseases of the circulatory system: Secondary | ICD-10-CM | POA: Insufficient documentation

## 2016-11-08 DIAGNOSIS — Z90710 Acquired absence of both cervix and uterus: Secondary | ICD-10-CM | POA: Insufficient documentation

## 2016-11-08 DIAGNOSIS — Z8619 Personal history of other infectious and parasitic diseases: Secondary | ICD-10-CM | POA: Insufficient documentation

## 2016-11-08 DIAGNOSIS — Z791 Long term (current) use of non-steroidal anti-inflammatories (NSAID): Secondary | ICD-10-CM | POA: Insufficient documentation

## 2016-11-08 DIAGNOSIS — M4854XA Collapsed vertebra, not elsewhere classified, thoracic region, initial encounter for fracture: Secondary | ICD-10-CM | POA: Diagnosis not present

## 2016-11-08 DIAGNOSIS — M1990 Unspecified osteoarthritis, unspecified site: Secondary | ICD-10-CM | POA: Insufficient documentation

## 2016-11-08 DIAGNOSIS — Z9889 Other specified postprocedural states: Secondary | ICD-10-CM | POA: Insufficient documentation

## 2016-11-08 DIAGNOSIS — Z823 Family history of stroke: Secondary | ICD-10-CM | POA: Insufficient documentation

## 2016-11-08 DIAGNOSIS — I2510 Atherosclerotic heart disease of native coronary artery without angina pectoris: Secondary | ICD-10-CM | POA: Insufficient documentation

## 2016-11-08 DIAGNOSIS — Z8673 Personal history of transient ischemic attack (TIA), and cerebral infarction without residual deficits: Secondary | ICD-10-CM | POA: Insufficient documentation

## 2016-11-08 DIAGNOSIS — Z86010 Personal history of colonic polyps: Secondary | ICD-10-CM | POA: Insufficient documentation

## 2016-11-08 DIAGNOSIS — Z9049 Acquired absence of other specified parts of digestive tract: Secondary | ICD-10-CM | POA: Insufficient documentation

## 2016-11-08 DIAGNOSIS — K5790 Diverticulosis of intestine, part unspecified, without perforation or abscess without bleeding: Secondary | ICD-10-CM | POA: Insufficient documentation

## 2016-11-08 DIAGNOSIS — Z7982 Long term (current) use of aspirin: Secondary | ICD-10-CM | POA: Insufficient documentation

## 2016-11-08 DIAGNOSIS — E785 Hyperlipidemia, unspecified: Secondary | ICD-10-CM | POA: Insufficient documentation

## 2016-11-08 DIAGNOSIS — Z419 Encounter for procedure for purposes other than remedying health state, unspecified: Secondary | ICD-10-CM

## 2016-11-08 DIAGNOSIS — I10 Essential (primary) hypertension: Secondary | ICD-10-CM | POA: Insufficient documentation

## 2016-11-08 DIAGNOSIS — D510 Vitamin B12 deficiency anemia due to intrinsic factor deficiency: Secondary | ICD-10-CM | POA: Insufficient documentation

## 2016-11-08 DIAGNOSIS — Z800 Family history of malignant neoplasm of digestive organs: Secondary | ICD-10-CM | POA: Insufficient documentation

## 2016-11-08 DIAGNOSIS — Z87891 Personal history of nicotine dependence: Secondary | ICD-10-CM | POA: Insufficient documentation

## 2016-11-08 HISTORY — PX: KYPHOPLASTY: SHX5884

## 2016-11-08 SURGERY — KYPHOPLASTY
Anesthesia: General | Site: Spine Thoracic | Wound class: Clean

## 2016-11-08 MED ORDER — MIDAZOLAM HCL 2 MG/2ML IJ SOLN
INTRAMUSCULAR | Status: DC | PRN
  Administered 2016-11-08 (×2): .5 mg via INTRAVENOUS
  Administered 2016-11-08: 1 mg via INTRAVENOUS

## 2016-11-08 MED ORDER — HYDROCODONE-ACETAMINOPHEN 5-325 MG PO TABS
ORAL_TABLET | ORAL | Status: DC
Start: 2016-11-08 — End: 2016-11-08
  Filled 2016-11-08: qty 1

## 2016-11-08 MED ORDER — PROPOFOL 500 MG/50ML IV EMUL
INTRAVENOUS | Status: AC
  Filled 2016-11-08: qty 50

## 2016-11-08 MED ORDER — CEFAZOLIN SODIUM-DEXTROSE 1-4 GM/50ML-% IV SOLN
INTRAVENOUS | Status: AC
  Filled 2016-11-08: qty 50

## 2016-11-08 MED ORDER — ONDANSETRON HCL 4 MG/2ML IJ SOLN: 4.0000 mg | Freq: Once | INTRAMUSCULAR | Status: DC | PRN

## 2016-11-08 MED ORDER — MIDAZOLAM HCL 2 MG/2ML IJ SOLN
INTRAMUSCULAR | Status: AC
  Filled 2016-11-08: qty 2

## 2016-11-08 MED ORDER — LACTATED RINGERS IV SOLN
INTRAVENOUS | Status: DC
  Administered 2016-11-08 (×2): via INTRAVENOUS

## 2016-11-08 MED ORDER — SODIUM CHLORIDE 0.9 % IV SOLN: INTRAVENOUS | Status: DC

## 2016-11-08 MED ORDER — BUPIVACAINE-EPINEPHRINE (PF) 0.5% -1:200000 IJ SOLN
INTRAMUSCULAR | Status: DC | PRN
  Administered 2016-11-08: 10 mL

## 2016-11-08 MED ORDER — ONDANSETRON HCL 4 MG PO TABS: 4.0000 mg | ORAL_TABLET | Freq: Four times a day (QID) | ORAL | Status: DC | PRN

## 2016-11-08 MED ORDER — PROPOFOL 10 MG/ML IV BOLUS
INTRAVENOUS | Status: DC | PRN
  Administered 2016-11-08 (×2): 20 mg via INTRAVENOUS

## 2016-11-08 MED ORDER — HYDROCODONE-ACETAMINOPHEN 5-325 MG PO TABS
1.0000 | ORAL_TABLET | ORAL | Status: DC | PRN
  Administered 2016-11-08: 1 via ORAL

## 2016-11-08 MED ORDER — ACETAMINOPHEN 10 MG/ML IV SOLN: INTRAVENOUS | Status: DC | PRN

## 2016-11-08 MED ORDER — PROPOFOL 500 MG/50ML IV EMUL
INTRAVENOUS | Status: DC | PRN
  Administered 2016-11-08: 50 ug/kg/min via INTRAVENOUS

## 2016-11-08 MED ORDER — IOPAMIDOL (ISOVUE-M 200) INJECTION 41%
INTRAMUSCULAR | Status: AC
  Filled 2016-11-08: qty 20

## 2016-11-08 MED ORDER — LIDOCAINE HCL 1 % IJ SOLN
INTRAMUSCULAR | Status: DC | PRN
Start: 2016-11-08 — End: 2016-11-08
  Administered 2016-11-08 (×2): 10 mL

## 2016-11-08 MED ORDER — HYDROCODONE-ACETAMINOPHEN 5-325 MG PO TABS: 1.0000 | ORAL_TABLET | Freq: Four times a day (QID) | ORAL | 0 refills | Status: DC | PRN

## 2016-11-08 MED ORDER — BUPIVACAINE-EPINEPHRINE (PF) 0.5% -1:200000 IJ SOLN
INTRAMUSCULAR | Status: AC
  Filled 2016-11-08: qty 30

## 2016-11-08 MED ORDER — METOCLOPRAMIDE HCL 10 MG PO TABS: 5.0000 mg | ORAL_TABLET | Freq: Three times a day (TID) | ORAL | Status: DC | PRN

## 2016-11-08 MED ORDER — ONDANSETRON HCL 4 MG/2ML IJ SOLN: 4.0000 mg | Freq: Four times a day (QID) | INTRAMUSCULAR | Status: DC | PRN

## 2016-11-08 MED ORDER — FENTANYL CITRATE (PF) 100 MCG/2ML IJ SOLN
INTRAMUSCULAR | Status: AC
  Administered 2016-11-08: 25 ug via INTRAVENOUS
  Filled 2016-11-08: qty 2

## 2016-11-08 MED ORDER — LIDOCAINE HCL (PF) 1 % IJ SOLN
INTRAMUSCULAR | Status: AC
  Filled 2016-11-08: qty 60

## 2016-11-08 MED ORDER — DEXTROSE 5 % IV SOLN
1000.0000 mg | Freq: Once | INTRAVENOUS | Status: AC
  Administered 2016-11-08: 1000 mg via INTRAVENOUS
  Filled 2016-11-08: qty 10

## 2016-11-08 MED ORDER — METOCLOPRAMIDE HCL 5 MG/ML IJ SOLN: 5.0000 mg | Freq: Three times a day (TID) | INTRAMUSCULAR | Status: DC | PRN

## 2016-11-08 MED ORDER — FENTANYL CITRATE (PF) 100 MCG/2ML IJ SOLN
25.0000 ug | INTRAMUSCULAR | Status: DC | PRN
  Administered 2016-11-08 (×4): 25 ug via INTRAVENOUS

## 2016-11-08 SURGICAL SUPPLY — 16 items
CEMENT BONE KYPHON CDS (Cement) ×3 IMPLANT
DERMABOND ADVANCED (GAUZE/BANDAGES/DRESSINGS) ×2
DERMABOND ADVANCED .7 DNX12 (GAUZE/BANDAGES/DRESSINGS) ×1 IMPLANT
DEVICE BIOPSY BONE KYPHX (INSTRUMENTS) ×3 IMPLANT
DRAPE C-ARM XRAY 36X54 (DRAPES) ×3 IMPLANT
DURAPREP 26ML APPLICATOR (WOUND CARE) ×3 IMPLANT
GLOVE SURG SYN 9.0  PF PI (GLOVE) ×2
GLOVE SURG SYN 9.0 PF PI (GLOVE) ×1 IMPLANT
GOWN SRG 2XL LVL 4 RGLN SLV (GOWNS) ×1 IMPLANT
GOWN STRL NON-REIN 2XL LVL4 (GOWNS) ×2
GOWN STRL REUS W/ TWL LRG LVL3 (GOWN DISPOSABLE) ×1 IMPLANT
GOWN STRL REUS W/TWL LRG LVL3 (GOWN DISPOSABLE) ×2
PACK KYPHOPLASTY (MISCELLANEOUS) ×3 IMPLANT
STRAP SAFETY BODY (MISCELLANEOUS) ×3 IMPLANT
TRAY KYPHOPAK 15/3 EXPRESS 1ST (MISCELLANEOUS) ×3 IMPLANT
TRAY KYPHOPAK 20/3 EXPRESS 1ST (MISCELLANEOUS) IMPLANT

## 2016-11-08 NOTE — Discharge Instructions (Addendum)
Remove Band-Aid on Saturday and then okay to shower. Minimal activities today, resume normal activities tomorrow as tolerated.  General Anesthesia, Adult, Care After These instructions provide you with information about caring for yourself after your procedure. Your health care provider may also give you more specific instructions. Your treatment has been planned according to current medical practices, but problems sometimes occur. Call your health care provider if you have any problems or questions after your procedure. What can I expect after the procedure? After the procedure, it is common to have:  Vomiting.  A sore throat.  Mental slowness. It is common to feel:  Nauseous.  Cold or shivery.  Sleepy.  Tired.  Sore or achy, even in parts of your body where you did not have surgery. Follow these instructions at home: For at least 24 hours after the procedure:   Do not:  Participate in activities where you could fall or become injured.  Drive.  Use heavy machinery.  Drink alcohol.  Take sleeping pills or medicines that cause drowsiness.  Make important decisions or sign legal documents.  Take care of children on your own.  Rest. Eating and drinking   If you vomit, drink water, juice, or soup when you can drink without vomiting.  Drink enough fluid to keep your urine clear or pale yellow.  Make sure you have little or no nausea before eating solid foods.  Follow the diet recommended by your health care provider. General instructions   Have a responsible adult stay with you until you are awake and alert.  Return to your normal activities as told by your health care provider. Ask your health care provider what activities are safe for you.  Take over-the-counter and prescription medicines only as told by your health care provider.  If you smoke, do not smoke without supervision.  Keep all follow-up visits as told by your health care provider. This is  important. Contact a health care provider if:  You continue to have nausea or vomiting at home, and medicines are not helpful.  You cannot drink fluids or start eating again.  You cannot urinate after 8-12 hours.  You develop a skin rash.  You have fever.  You have increasing redness at the site of your procedure. Get help right away if:  You have difficulty breathing.  You have chest pain.  You have unexpected bleeding.  You feel that you are having a life-threatening or urgent problem. This information is not intended to replace advice given to you by your health care provider. Make sure you discuss any questions you have with your health care provider. Document Released: 10/01/2000 Document Revised: 11/28/2015 Document Reviewed: 06/09/2015 Elsevier Interactive Patient Education  2017 [employer].

## 2016-11-08 NOTE — Transfer of Care (Signed)
Immediate Anesthesia Transfer of Care Note  Patient: Robin Graves  Procedure(s) Performed: Procedure(s): KYPHOPLASTY- T9 (N/A)  Patient Location: PACU  Anesthesia Type:General  Level of Consciousness: awake, oriented and patient cooperative  Airway & Oxygen Therapy: Patient Spontanous Breathing and Patient connected to nasal cannula oxygen  Post-op Assessment: Report given to RN, Post -op Vital signs reviewed and stable and Patient moving all extremities X 4  Post vital signs: Reviewed and stable  Last Vitals:  Vitals:   11/08/16 1154  BP: 129/71  Pulse: 64  Resp: 16  Temp: 36.7 C    Last Pain:  Vitals:   11/08/16 1154  TempSrc: Oral  PainSc: 4          Complications: No apparent anesthesia complications

## 2016-11-08 NOTE — H&P (Signed)
Reviewed paper H+P, will be scanned into chart. No changes noted. MRI showed T9 fracture, patient to have T9 kyphoplasty today. Risks, benefits, possible complications explained to patient.

## 2016-11-08 NOTE — Op Note (Signed)
11/08/2016  1:58 PM  PATIENT:  Robin Graves  76 y.o. female  PRE-OPERATIVE DIAGNOSIS:  WEDGE COMPRESSION FRACTURE OF T9 VERTEBRA  POST-OPERATIVE DIAGNOSIS:  WEDGE COMPRESSION FRACTURE OF T9 VERTEBRA  PROCEDURE:  Procedure(s): KYPHOPLASTY- T9 (N/A)  SURGEON: Laurene Footman, MD  ASSISTANTS: None  ANESTHESIA:   local and MAC  EBL:  Total I/O In: 1300 [I.V.:1300] Out: 5 [Blood:5]  BLOOD ADMINISTERED:none  DRAINS: none   LOCAL MEDICATIONS USED:  MARCAINE    and XYLOCAINE   SPECIMEN:  Source of Specimen:  T9  DISPOSITION OF SPECIMEN:  PATHOLOGY  COUNTS:  YES  TOURNIQUET:  * No tourniquets in log *  IMPLANTS: Bone cement  DICTATION: .Dragon Dictation patient brought the operating room and after adequate sedation was given the patient was placed prone and C-arm was brought in. After good visualization of T9 in both AP and lateral projections appropriate patient identification and timeout procedures were carried out. Xylocaine was infiltrated on both sides of T9 in the area of the planned incisions. After prepping and draping in usual sterile fashion repeat timeout procedure was carried out. Spinal needle was placed on the right side getting down to the pedicle on T9 with local anesthetic infiltrated. After allowing this to set a small incision was made and a trocar advanced in a extrapedicular fashion and the vertebral body entered. Biopsy obtained, care was taken to stay out of the neural foramen and spinal canal. Drilling was then carried out and a balloon inserted it crossed the midline and so additional left sided stick was not required. The balloon was inflated to approximately 2 and half cc with partial correction of the deformity. Cement was mixed and was the appropriate consistency was infiltrated in the body with approximately 3 cc getting good fill. After the cemented set the trochars removed and permanent C-arm views obtained. Wound was closed with Dermabond followed by a  Band-Aid  PLAN OF CARE: Discharge to home after PACU  PATIENT DISPOSITION:  PACU - hemodynamically stable.

## 2016-11-08 NOTE — Anesthesia Preprocedure Evaluation (Signed)
Anesthesia Evaluation  Patient identified by MRN, date of birth, ID band Patient awake    Reviewed: Allergy & Precautions, NPO status , Patient's Chart, lab work & pertinent test results, reviewed documented beta blocker date and time   Airway Mallampati: II  TM Distance: >3 FB     Dental  (+) Chipped, Lower Dentures, Upper Dentures   Pulmonary COPD, former smoker,    Pulmonary exam normal        Cardiovascular hypertension, Pt. on medications + CAD  Normal cardiovascular exam     Neuro/Psych  Headaches, TIACVA negative psych ROS   GI/Hepatic hiatal hernia, GERD  ,Colon polyps   Endo/Other  Hypothyroidism   Renal/GU      Musculoskeletal  (+) Arthritis ,   Abdominal   Peds  Hematology  (+) anemia ,   Anesthesia Other Findings Past Medical History: No date: Anemia No date: Arthritis     Comment: BACK No date: Chicken pox No date: Colon polyps No date: COPD (chronic obstructive pulmonary disease) (* No date: Diverticulosis No date: Emphysema of lung (HCC) No date: GERD (gastroesophageal reflux disease) No date: Graves disease No date: Graves' disease with exophthalmos     Comment: REMOVED ON THYROID MEDS No date: Headache     Comment: MIGRAINES, 1 EVERY DAY OR LESS OFTEN No date: HH (hiatus hernia) No date: Hyperlipidemia No date: Pernicious anemia No date: Stroke [facility])     Comment: MINI STROKES 1995 A FEW BEFORE 1995 No date: Wears dentures     Comment: UPPER AND LOWER  Reproductive/Obstetrics                             Anesthesia Physical  Anesthesia Plan  ASA: III  Anesthesia Plan: General   Post-op Pain Management:    Induction:   Airway Management Planned: Nasal Cannula  Additional Equipment:   Intra-op Plan:   Post-operative Plan:   Informed Consent: I have reviewed the patients History and Physical, chart, labs and discussed the procedure including the  risks, benefits and alternatives for the proposed anesthesia with the patient or authorized representative who has indicated his/her understanding and acceptance.     Plan Discussed with: CRNA  Anesthesia Plan Comments:         Anesthesia Quick Evaluation

## 2016-11-08 NOTE — Anesthesia Post-op Follow-up Note (Cosign Needed)
Anesthesia QCDR form completed.        

## 2016-11-08 NOTE — Anesthesia Procedure Notes (Signed)
Date/Time: 11/08/2016 1:21 PM Performed by: Nelda Marseille Pre-anesthesia Checklist: Patient identified, Emergency Drugs available, Suction available, Patient being monitored and Timeout performed Oxygen Delivery Method: Nasal cannula

## 2016-11-08 NOTE — Anesthesia Postprocedure Evaluation (Signed)
Anesthesia Post Note  Patient: Robin Graves  Procedure(s) Performed: Procedure(s) (LRB): KYPHOPLASTY- T9 (N/A)  Patient location during evaluation: PACU Anesthesia Type: General Level of consciousness: awake and alert and oriented Pain management: pain level controlled Vital Signs Assessment: post-procedure vital signs reviewed and stable Respiratory status: spontaneous breathing Cardiovascular status: blood pressure returned to baseline Anesthetic complications: no     Last Vitals:  Vitals:   11/08/16 1443 11/08/16 1453  BP: 122/72 116/78  Pulse: 65 70  Resp: 20 14  Temp: 36.3 C 36.2 C    Last Pain:  Vitals:   11/08/16 1453  TempSrc: Temporal  PainSc: 5                  Chelsy Parrales

## 2016-11-09 LAB — SURGICAL PATHOLOGY

## 2016-12-14 ENCOUNTER — Other Ambulatory Visit: Payer: Self-pay | Admitting: Orthopedic Surgery

## 2016-12-14 DIAGNOSIS — M545 Low back pain, unspecified: Secondary | ICD-10-CM

## 2016-12-19 ENCOUNTER — Ambulatory Visit
Admission: RE | Admit: 2016-12-19 | Discharge: 2016-12-19 | Disposition: A | Discharge status: Home / Self Care | Payer: Medicare Other | Source: Ambulatory Visit | Attending: Orthopedic Surgery | Admitting: Orthopedic Surgery

## 2016-12-19 DIAGNOSIS — M545 Low back pain, unspecified: Secondary | ICD-10-CM

## 2016-12-19 DIAGNOSIS — M4856XA Collapsed vertebra, not elsewhere classified, lumbar region, initial encounter for fracture: Secondary | ICD-10-CM | POA: Insufficient documentation

## 2016-12-19 DIAGNOSIS — M4854XA Collapsed vertebra, not elsewhere classified, thoracic region, initial encounter for fracture: Secondary | ICD-10-CM | POA: Diagnosis not present

## 2016-12-19 DIAGNOSIS — M5136 Other intervertebral disc degeneration, lumbar region: Secondary | ICD-10-CM | POA: Diagnosis not present

## 2016-12-19 DIAGNOSIS — X58XXXA Exposure to other specified factors, initial encounter: Secondary | ICD-10-CM | POA: Diagnosis not present

## 2016-12-19 DIAGNOSIS — D1809 Hemangioma of other sites: Secondary | ICD-10-CM | POA: Insufficient documentation

## 2016-12-25 ENCOUNTER — Other Ambulatory Visit: Payer: Self-pay | Admitting: Orthopedic Surgery

## 2016-12-25 DIAGNOSIS — M9973 Connective tissue and disc stenosis of intervertebral foramina of lumbar region: Secondary | ICD-10-CM

## 2016-12-31 ENCOUNTER — Ambulatory Visit
Admission: RE | Admit: 2016-12-31 | Discharge: 2016-12-31 | Disposition: A | Discharge status: Home / Self Care | Payer: Medicare Other | Source: Ambulatory Visit | Attending: Orthopedic Surgery | Admitting: Orthopedic Surgery

## 2016-12-31 DIAGNOSIS — M9973 Connective tissue and disc stenosis of intervertebral foramina of lumbar region: Secondary | ICD-10-CM

## 2016-12-31 MED ORDER — METHYLPREDNISOLONE ACETATE 40 MG/ML INJ SUSP (RADIOLOG
120.0000 mg | Freq: Once | INTRAMUSCULAR | Status: AC
  Administered 2016-12-31: 120 mg via EPIDURAL

## 2016-12-31 MED ORDER — IOPAMIDOL (ISOVUE-M 200) INJECTION 41%
1.0000 mL | Freq: Once | INTRAMUSCULAR | Status: AC
  Administered 2016-12-31: 1 mL via EPIDURAL

## 2016-12-31 NOTE — Discharge Instructions (Signed)

## 2017-01-16 ENCOUNTER — Encounter: Payer: Self-pay | Admitting: *Deleted

## 2017-01-21 NOTE — Discharge Instructions (Signed)

## 2017-01-23 ENCOUNTER — Ambulatory Visit
Admission: RE | Admit: 2017-01-23 | Discharge: 2017-01-23 | Disposition: A | Discharge status: Home / Self Care | Payer: Medicare Other | Source: Ambulatory Visit | Attending: Ophthalmology | Admitting: Ophthalmology

## 2017-01-23 ENCOUNTER — Encounter
Admission: RE | Disposition: A | Discharge status: Home / Self Care | Payer: Self-pay | Source: Ambulatory Visit | Attending: Ophthalmology

## 2017-01-23 ENCOUNTER — Ambulatory Visit: Payer: Medicare Other | Admitting: Anesthesiology

## 2017-01-23 DIAGNOSIS — H2512 Age-related nuclear cataract, left eye: Secondary | ICD-10-CM | POA: Insufficient documentation

## 2017-01-23 DIAGNOSIS — I252 Old myocardial infarction: Secondary | ICD-10-CM | POA: Insufficient documentation

## 2017-01-23 DIAGNOSIS — I110 Hypertensive heart disease with heart failure: Secondary | ICD-10-CM | POA: Diagnosis not present

## 2017-01-23 DIAGNOSIS — M810 Age-related osteoporosis without current pathological fracture: Secondary | ICD-10-CM | POA: Insufficient documentation

## 2017-01-23 DIAGNOSIS — E7800 Pure hypercholesterolemia, unspecified: Secondary | ICD-10-CM | POA: Insufficient documentation

## 2017-01-23 DIAGNOSIS — J449 Chronic obstructive pulmonary disease, unspecified: Secondary | ICD-10-CM | POA: Diagnosis not present

## 2017-01-23 DIAGNOSIS — K219 Gastro-esophageal reflux disease without esophagitis: Secondary | ICD-10-CM | POA: Diagnosis not present

## 2017-01-23 DIAGNOSIS — Z8673 Personal history of transient ischemic attack (TIA), and cerebral infarction without residual deficits: Secondary | ICD-10-CM | POA: Diagnosis not present

## 2017-01-23 DIAGNOSIS — Z87891 Personal history of nicotine dependence: Secondary | ICD-10-CM | POA: Diagnosis not present

## 2017-01-23 DIAGNOSIS — I509 Heart failure, unspecified: Secondary | ICD-10-CM | POA: Diagnosis not present

## 2017-01-23 DIAGNOSIS — Z9013 Acquired absence of bilateral breasts and nipples: Secondary | ICD-10-CM | POA: Diagnosis not present

## 2017-01-23 DIAGNOSIS — M1990 Unspecified osteoarthritis, unspecified site: Secondary | ICD-10-CM | POA: Diagnosis not present

## 2017-01-23 DIAGNOSIS — I2510 Atherosclerotic heart disease of native coronary artery without angina pectoris: Secondary | ICD-10-CM | POA: Diagnosis not present

## 2017-01-23 DIAGNOSIS — Z90710 Acquired absence of both cervix and uterus: Secondary | ICD-10-CM | POA: Insufficient documentation

## 2017-01-23 DIAGNOSIS — E039 Hypothyroidism, unspecified: Secondary | ICD-10-CM | POA: Diagnosis not present

## 2017-01-23 HISTORY — PX: CATARACT EXTRACTION W/PHACO: SHX586

## 2017-01-23 SURGERY — PHACOEMULSIFICATION, CATARACT, WITH IOL INSERTION
Anesthesia: Monitor Anesthesia Care | Site: Eye | Laterality: Left | Wound class: Clean

## 2017-01-23 MED ORDER — ARMC OPHTHALMIC DILATING DROPS
1.0000 "application " | OPHTHALMIC | Status: DC | PRN
  Administered 2017-01-23 (×3): 1 via OPHTHALMIC

## 2017-01-23 MED ORDER — BRIMONIDINE TARTRATE-TIMOLOL 0.2-0.5 % OP SOLN
OPHTHALMIC | Status: DC | PRN
  Administered 2017-01-23: 1 [drp] via OPHTHALMIC

## 2017-01-23 MED ORDER — NA HYALUR & NA CHOND-NA HYALUR 0.4-0.35 ML IO KIT
PACK | INTRAOCULAR | Status: DC | PRN
  Administered 2017-01-23: 1 mL via INTRAOCULAR

## 2017-01-23 MED ORDER — LIDOCAINE HCL (PF) 2 % IJ SOLN
INTRAOCULAR | Status: DC | PRN
  Administered 2017-01-23: .5 mL via INTRAOCULAR

## 2017-01-23 MED ORDER — ACETAMINOPHEN 160 MG/5ML PO SOLN: 325.0000 mg | ORAL | Status: DC | PRN

## 2017-01-23 MED ORDER — FENTANYL CITRATE (PF) 100 MCG/2ML IJ SOLN
INTRAMUSCULAR | Status: DC | PRN
  Administered 2017-01-23: 50 ug via INTRAVENOUS

## 2017-01-23 MED ORDER — MIDAZOLAM HCL 2 MG/2ML IJ SOLN
INTRAMUSCULAR | Status: DC | PRN
  Administered 2017-01-23: 2 mg via INTRAVENOUS

## 2017-01-23 MED ORDER — ACETAMINOPHEN 325 MG PO TABS: 325.0000 mg | ORAL_TABLET | ORAL | Status: DC | PRN

## 2017-01-23 MED ORDER — CEFUROXIME OPHTHALMIC INJECTION 1 MG/0.1 ML
INJECTION | OPHTHALMIC | Status: DC | PRN
  Administered 2017-01-23: 0.1 mL via INTRACAMERAL

## 2017-01-23 MED ORDER — LACTATED RINGERS IV SOLN: INTRAVENOUS | Status: DC

## 2017-01-23 MED ORDER — MOXIFLOXACIN HCL 0.5 % OP SOLN
1.0000 [drp] | OPHTHALMIC | Status: DC | PRN
  Administered 2017-01-23 (×3): 1 [drp] via OPHTHALMIC

## 2017-01-23 MED ORDER — EPINEPHRINE PF 1 MG/ML IJ SOLN
INTRAOCULAR | Status: DC | PRN
  Administered 2017-01-23: 68 mL via OPHTHALMIC

## 2017-01-23 SURGICAL SUPPLY — 25 items
CANNULA ANT/CHMB 27GA (MISCELLANEOUS) ×3 IMPLANT
CARTRIDGE ABBOTT (MISCELLANEOUS) IMPLANT
GLOVE SURG LX 7.5 STRW (GLOVE) ×2
GLOVE SURG LX STRL 7.5 STRW (GLOVE) ×1 IMPLANT
GLOVE SURG TRIUMPH 8.0 PF LTX (GLOVE) ×3 IMPLANT
GOWN STRL REUS W/ TWL LRG LVL3 (GOWN DISPOSABLE) ×2 IMPLANT
GOWN STRL REUS W/TWL LRG LVL3 (GOWN DISPOSABLE) ×4
LENS IOL TECNIS ITEC 22.5 (Intraocular Lens) ×3 IMPLANT
MARKER SKIN DUAL TIP RULER LAB (MISCELLANEOUS) ×3 IMPLANT
NDL RETROBULBAR .5 NSTRL (NEEDLE) IMPLANT
NEEDLE FILTER BLUNT 18X 1/2SAF (NEEDLE) ×2
NEEDLE FILTER BLUNT 18X1 1/2 (NEEDLE) ×1 IMPLANT
PACK CATARACT BRASINGTON (MISCELLANEOUS) ×3 IMPLANT
PACK EYE AFTER SURG (MISCELLANEOUS) ×3 IMPLANT
PACK OPTHALMIC (MISCELLANEOUS) ×3 IMPLANT
RING MALYGIN 7.0 (MISCELLANEOUS) IMPLANT
SUT ETHILON 10-0 CS-B-6CS-B-6 (SUTURE)
SUT VICRYL  9 0 (SUTURE)
SUT VICRYL 9 0 (SUTURE) IMPLANT
SUTURE EHLN 10-0 CS-B-6CS-B-6 (SUTURE) IMPLANT
SYR 3ML LL SCALE MARK (SYRINGE) ×3 IMPLANT
SYR 5ML LL (SYRINGE) ×3 IMPLANT
SYR TB 1ML LUER SLIP (SYRINGE) ×3 IMPLANT
WATER STERILE IRR 250ML POUR (IV SOLUTION) ×3 IMPLANT
WIPE NON LINTING 3.25X3.25 (MISCELLANEOUS) ×3 IMPLANT

## 2017-01-23 NOTE — Transfer of Care (Signed)
Immediate Anesthesia Transfer of Care Note  Patient: Robin Graves  Procedure(s) Performed: Procedure(s): CATARACT EXTRACTION PHACO AND INTRAOCULAR LENS PLACEMENT (IOC)  Left (Left)  Patient Location: PACU  Anesthesia Type: MAC  Level of Consciousness: awake, alert  and patient cooperative  Airway and Oxygen Therapy: Patient Spontanous Breathing and Patient connected to supplemental oxygen  Post-op Assessment: Post-op Vital signs reviewed, Patient's Cardiovascular Status Stable, Respiratory Function Stable, Patent Airway and No signs of Nausea or vomiting  Post-op Vital Signs: Reviewed and stable  Complications: No apparent anesthesia complications

## 2017-01-23 NOTE — Anesthesia Preprocedure Evaluation (Signed)
Anesthesia Evaluation  Patient identified by MRN, date of birth, ID band Patient awake    Reviewed: Allergy & Precautions, H&P , NPO status , Patient's Chart, lab work & pertinent test results  Airway Mallampati: III  TM Distance: >3 FB Neck ROM: full    Dental  (+) Upper Dentures, Lower Dentures   Pulmonary COPD, former smoker,    Pulmonary exam normal breath sounds clear to auscultation       Cardiovascular hypertension, + CAD and +CHF  Normal cardiovascular exam Rhythm:regular Rate:Normal     Neuro/Psych CVA    GI/Hepatic GERD  ,  Endo/Other  Hypothyroidism   Renal/GU      Musculoskeletal   Abdominal   Peds  Hematology   Anesthesia Other Findings   Reproductive/Obstetrics                             Anesthesia Physical Anesthesia Plan  ASA: III  Anesthesia Plan: MAC   Post-op Pain Management:    Induction:   PONV Risk Score and Plan: 2 and Midazolam  Airway Management Planned:   Additional Equipment:   Intra-op Plan:   Post-operative Plan:   Informed Consent: I have reviewed the patients History and Physical, chart, labs and discussed the procedure including the risks, benefits and alternatives for the proposed anesthesia with the patient or authorized representative who has indicated his/her understanding and acceptance.     Plan Discussed with: CRNA  Anesthesia Plan Comments:         Anesthesia Quick Evaluation

## 2017-01-23 NOTE — Anesthesia Postprocedure Evaluation (Signed)
Anesthesia Post Note  Patient: Roux Brandy Flight  Procedure(s) Performed: Procedure(s) (LRB): CATARACT EXTRACTION PHACO AND INTRAOCULAR LENS PLACEMENT (IOC)  Left (Left)  Patient location during evaluation: PACU Anesthesia Type: MAC Level of consciousness: awake and alert and oriented Pain management: satisfactory to patient Vital Signs Assessment: post-procedure vital signs reviewed and stable Respiratory status: spontaneous breathing, nonlabored ventilation and respiratory function stable Cardiovascular status: blood pressure returned to baseline and stable Postop Assessment: Adequate PO intake and No signs of nausea or vomiting Anesthetic complications: no    Raliegh Ip

## 2017-01-23 NOTE — Anesthesia Procedure Notes (Signed)
Procedure Name: MAC Performed by: Ofilia Rayon Pre-anesthesia Checklist: Patient identified, Emergency Drugs available, Suction available, Timeout performed and Patient being monitored Patient Re-evaluated:Patient Re-evaluated prior to inductionOxygen Delivery Method: Nasal cannula Placement Confirmation: positive ETCO2     

## 2017-01-23 NOTE — H&P (Signed)
The History and Physical notes are on paper, have been signed, and are to be scanned. The patient remains stable and unchanged from the H&P.   Previous H&P reviewed, patient examined, and there are no changes.  Robin Graves 01/23/2017 8:51 AM

## 2017-01-23 NOTE — Op Note (Signed)
OPERATIVE NOTE  Robin Graves [id number] 01/23/2017   PREOPERATIVE DIAGNOSIS:  Nuclear sclerotic cataract left eye. H25.12   POSTOPERATIVE DIAGNOSIS:    Nuclear sclerotic cataract left eye.     PROCEDURE:  Phacoemusification with posterior chamber intraocular lens placement of the left eye   LENS:  * No implants in log *   PCB00 22.5 D   ULTRASOUND TIME: 19  % of 1 minutes 16 seconds, CDE 14.7  SURGEON:  Wyonia Hough, MD   ANESTHESIA:  Topical with tetracaine drops and 2% Xylocaine jelly, augmented with 1% preservative-free intracameral lidocaine.    COMPLICATIONS:  None.   DESCRIPTION OF PROCEDURE:  The patient was identified in the holding room and transported to the operating room and placed in the supine position under the operating microscope.  The left eye was identified as the operative eye and it was prepped and draped in the usual sterile ophthalmic fashion.   A 1 millimeter clear-corneal paracentesis was made at the 1:30 position.  0.5 ml of preservative-free 1% lidocaine was injected into the anterior chamber.  The anterior chamber was filled with Viscoat viscoelastic.  A 2.4 millimeter keratome was used to make a near-clear corneal incision at the 10:30 position.  .  A curvilinear capsulorrhexis was made with a cystotome and capsulorrhexis forceps.  Balanced salt solution was used to hydrodissect and hydrodelineate the nucleus.   Phacoemulsification was then used in stop and chop fashion to remove the lens nucleus and epinucleus.  The remaining cortex was then removed using the irrigation and aspiration handpiece. Provisc was then placed into the capsular bag to distend it for lens placement.  A lens was then injected into the capsular bag.  The remaining viscoelastic was aspirated.   Wounds were hydrated with balanced salt solution.  The anterior chamber was inflated to a physiologic pressure with balanced salt solution.  No wound leaks were noted. Cefuroxime 0.1 ml  of a 10mg /ml solution was injected into the anterior chamber for a dose of 1 mg of intracameral antibiotic at the completion of the case.   Timolol and Brimonidine drops were applied to the eye.  The patient was taken to the recovery room in stable condition without complications of anesthesia or surgery.  [name] 01/23/2017, 9:42 AM

## 2017-01-24 ENCOUNTER — Encounter: Payer: Self-pay | Admitting: Ophthalmology

## 2020-04-21 ENCOUNTER — Other Ambulatory Visit: Payer: Self-pay | Admitting: Internal Medicine

## 2020-04-21 DIAGNOSIS — I998 Other disorder of circulatory system: Secondary | ICD-10-CM

## 2020-04-21 DIAGNOSIS — R634 Abnormal weight loss: Secondary | ICD-10-CM

## 2020-04-21 DIAGNOSIS — K648 Other hemorrhoids: Secondary | ICD-10-CM

## 2020-04-21 DIAGNOSIS — R319 Hematuria, unspecified: Secondary | ICD-10-CM

## 2020-04-21 DIAGNOSIS — K635 Polyp of colon: Secondary | ICD-10-CM

## 2020-04-21 DIAGNOSIS — R790 Abnormal level of blood mineral: Secondary | ICD-10-CM

## 2020-04-21 DIAGNOSIS — D510 Vitamin B12 deficiency anemia due to intrinsic factor deficiency: Secondary | ICD-10-CM

## 2020-04-21 DIAGNOSIS — K5730 Diverticulosis of large intestine without perforation or abscess without bleeding: Secondary | ICD-10-CM

## 2020-04-21 DIAGNOSIS — K922 Gastrointestinal hemorrhage, unspecified: Secondary | ICD-10-CM

## 2020-04-28 ENCOUNTER — Other Ambulatory Visit: Payer: Self-pay | Admitting: Internal Medicine

## 2020-04-28 DIAGNOSIS — I998 Other disorder of circulatory system: Secondary | ICD-10-CM

## 2020-04-28 DIAGNOSIS — K635 Polyp of colon: Secondary | ICD-10-CM

## 2020-04-28 DIAGNOSIS — K5730 Diverticulosis of large intestine without perforation or abscess without bleeding: Secondary | ICD-10-CM

## 2020-04-28 DIAGNOSIS — R790 Abnormal level of blood mineral: Secondary | ICD-10-CM

## 2020-04-28 DIAGNOSIS — K922 Gastrointestinal hemorrhage, unspecified: Secondary | ICD-10-CM

## 2020-04-28 DIAGNOSIS — K648 Other hemorrhoids: Secondary | ICD-10-CM

## 2020-04-28 DIAGNOSIS — D510 Vitamin B12 deficiency anemia due to intrinsic factor deficiency: Secondary | ICD-10-CM

## 2020-04-28 DIAGNOSIS — K621 Rectal polyp: Secondary | ICD-10-CM

## 2020-04-28 DIAGNOSIS — R319 Hematuria, unspecified: Secondary | ICD-10-CM

## 2020-04-28 DIAGNOSIS — R634 Abnormal weight loss: Secondary | ICD-10-CM

## 2020-04-29 ENCOUNTER — Ambulatory Visit
Admission: RE | Admit: 2020-04-29 | Discharge: 2020-04-29 | Disposition: A | Discharge status: Home / Self Care | Payer: Medicare PPO | Source: Ambulatory Visit | Attending: Internal Medicine | Admitting: Internal Medicine

## 2020-04-29 ENCOUNTER — Other Ambulatory Visit: Payer: Self-pay

## 2020-04-29 DIAGNOSIS — K648 Other hemorrhoids: Secondary | ICD-10-CM | POA: Diagnosis present

## 2020-04-29 DIAGNOSIS — K635 Polyp of colon: Secondary | ICD-10-CM | POA: Insufficient documentation

## 2020-04-29 DIAGNOSIS — R634 Abnormal weight loss: Secondary | ICD-10-CM | POA: Insufficient documentation

## 2020-04-29 DIAGNOSIS — K5730 Diverticulosis of large intestine without perforation or abscess without bleeding: Secondary | ICD-10-CM

## 2020-04-29 DIAGNOSIS — R790 Abnormal level of blood mineral: Secondary | ICD-10-CM | POA: Insufficient documentation

## 2020-04-29 DIAGNOSIS — D510 Vitamin B12 deficiency anemia due to intrinsic factor deficiency: Secondary | ICD-10-CM | POA: Insufficient documentation

## 2020-04-29 DIAGNOSIS — R319 Hematuria, unspecified: Secondary | ICD-10-CM | POA: Diagnosis present

## 2020-04-29 DIAGNOSIS — I998 Other disorder of circulatory system: Secondary | ICD-10-CM | POA: Insufficient documentation

## 2020-04-29 DIAGNOSIS — K922 Gastrointestinal hemorrhage, unspecified: Secondary | ICD-10-CM | POA: Insufficient documentation

## 2020-04-29 DIAGNOSIS — K621 Rectal polyp: Secondary | ICD-10-CM

## 2020-04-29 LAB — POCT I-STAT CREATININE: Creatinine, Ser: 0.8 mg/dL (ref 0.44–1.00)

## 2020-04-29 MED ORDER — IOHEXOL 300 MG/ML  SOLN
75.0000 mL | Freq: Once | INTRAMUSCULAR | Status: AC | PRN
  Administered 2020-04-29: 75 mL via INTRAVENOUS

## 2020-08-19 ENCOUNTER — Other Ambulatory Visit: Payer: Self-pay | Admitting: Internal Medicine

## 2020-08-19 DIAGNOSIS — R519 Headache, unspecified: Secondary | ICD-10-CM

## 2020-08-19 DIAGNOSIS — R531 Weakness: Secondary | ICD-10-CM

## 2020-09-01 ENCOUNTER — Other Ambulatory Visit: Payer: Self-pay

## 2020-09-01 ENCOUNTER — Ambulatory Visit
Admission: RE | Admit: 2020-09-01 | Discharge: 2020-09-01 | Disposition: A | Discharge status: Home / Self Care | Payer: Medicare PPO | Source: Ambulatory Visit | Attending: Internal Medicine | Admitting: Internal Medicine

## 2020-09-01 DIAGNOSIS — R531 Weakness: Secondary | ICD-10-CM | POA: Diagnosis present

## 2020-09-01 DIAGNOSIS — R519 Headache, unspecified: Secondary | ICD-10-CM | POA: Diagnosis present

## 2021-01-04 IMAGING — CT CT CHEST W/ CM
2 of 5 series · 12 of 36 positions shown, 15 images · IV contrast (omnipaque)
Comparison: CT chest, 06/04/2005, MR thoracic spine, 03/08/2015

CLINICAL DATA: Anemia, weight loss, colorectal polyps, emphysema,
COPD, history of prophylactic mastectomy

EXAM:
CT CHEST, ABDOMEN, AND PELVIS WITH CONTRAST
TECHNIQUE: Multidetector CT imaging of the chest, abdomen and pelvis was
performed following the standard protocol during bolus
administration of intravenous contrast.
CONTRAST:  75mL OMNIPAQUE IOHEXOL 300 MG/ML SOLN, additional oral
enteric contrast

[Series 2: cap with · axial · 0.69mm/px · z∈[-334,+96]mm · 9 of 108 slices shown, 12 images]
[im 11/108  mediastinal]
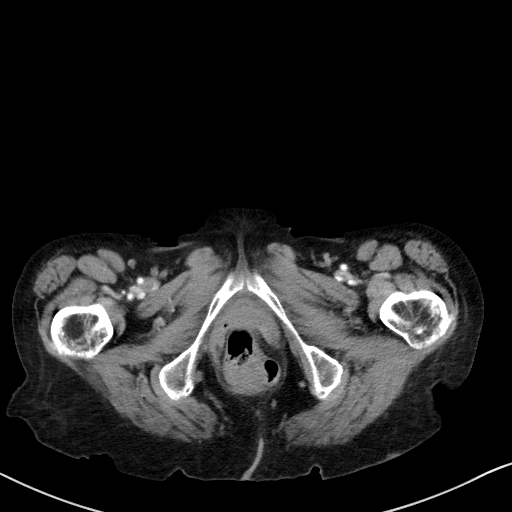
[im 11/108  lung]
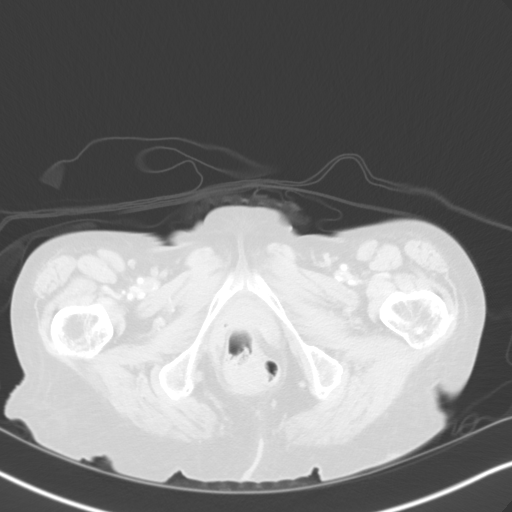
[im 22/108  lung]
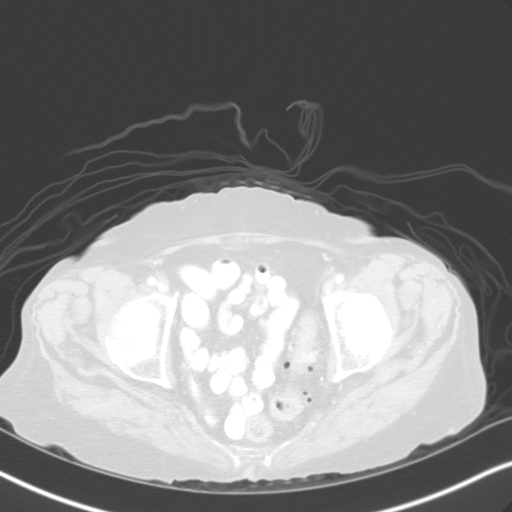
[im 33/108  lung]
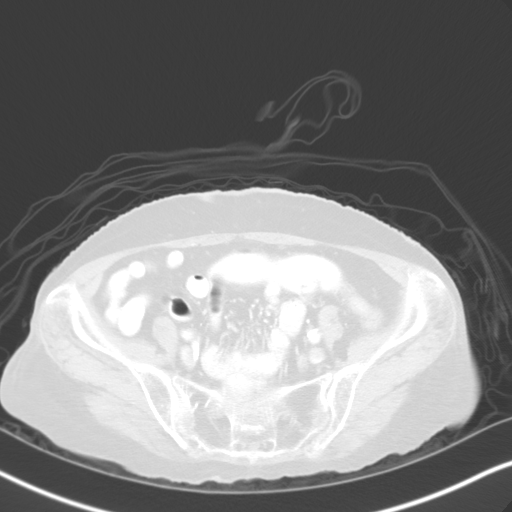
[im 43/108  lung]
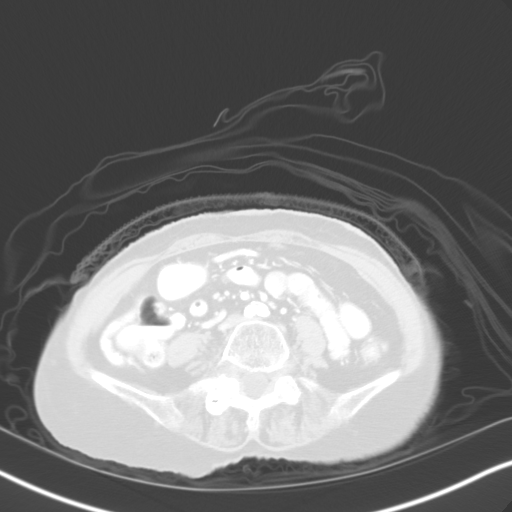
[im 54/108  mediastinal]
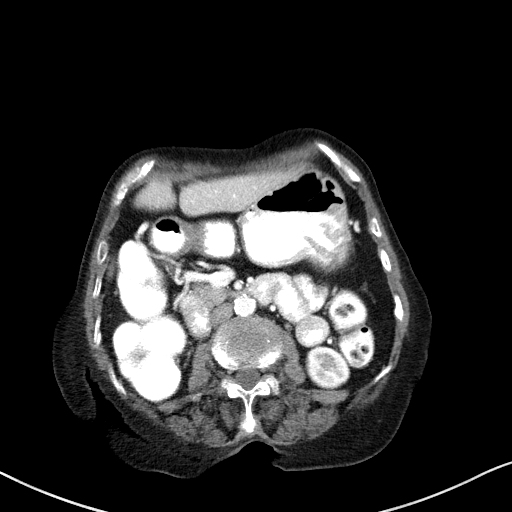
[im 54/108  lung]
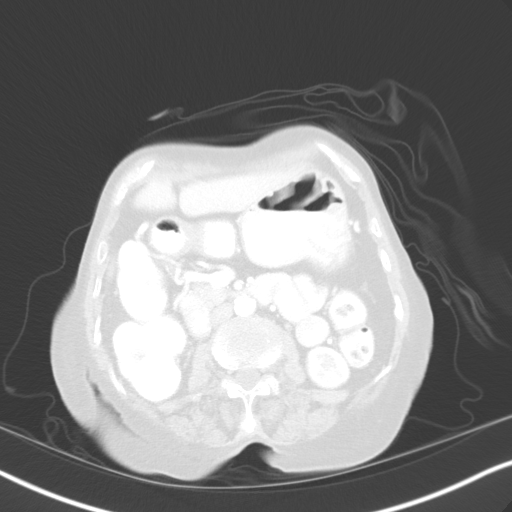
[im 65/108  lung]
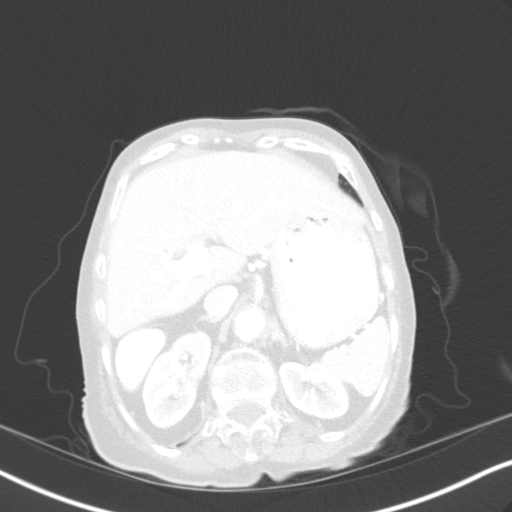
[im 75/108  lung]
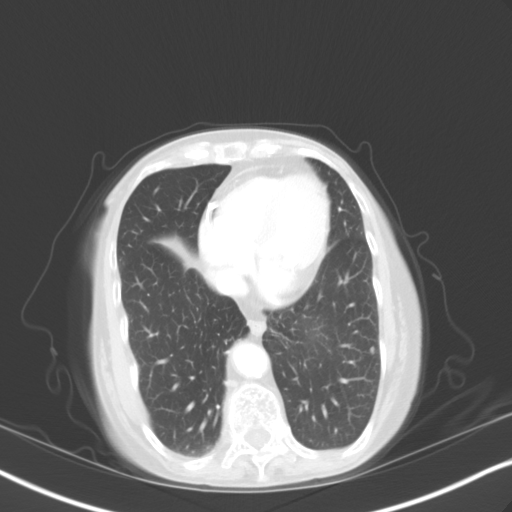
[im 86/108  lung]
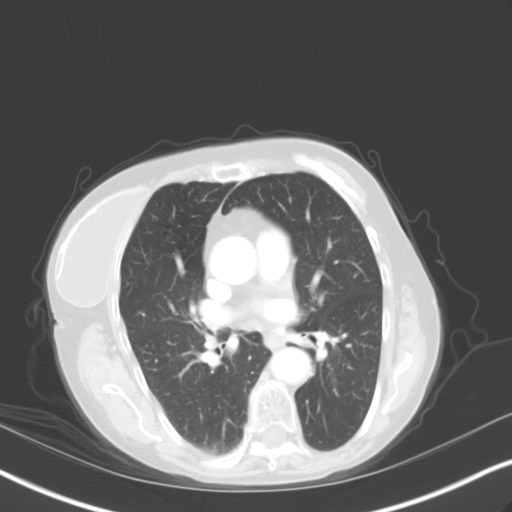
[im 97/108  mediastinal]
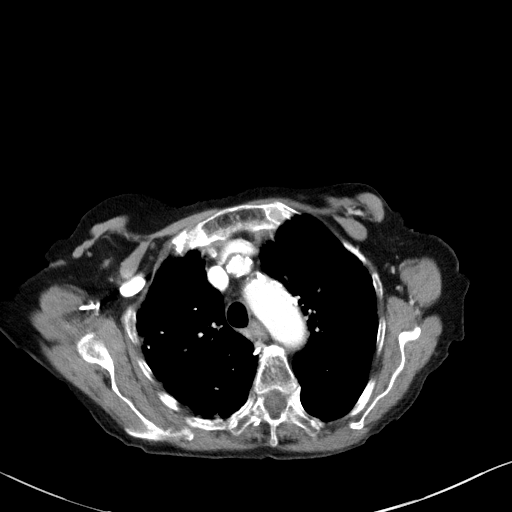
[im 97/108  lung]
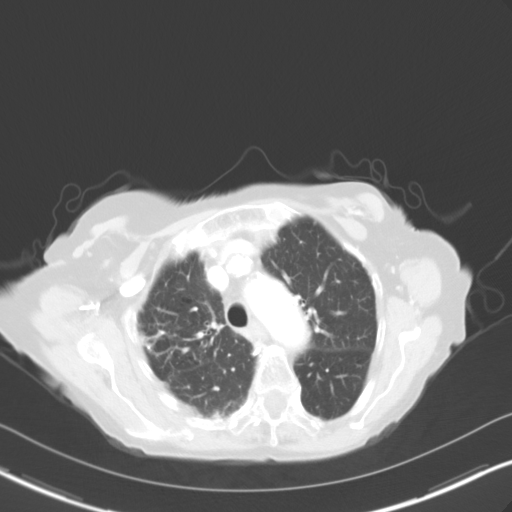

[Series 5: coronals · coronal · 0.58mm/px · 3 of 126 slices shown]
[im 26/126  lung]
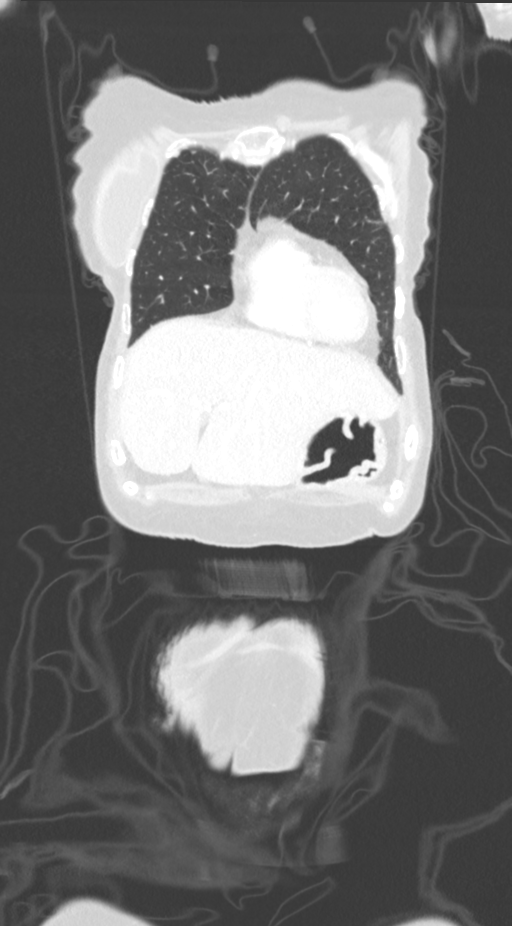
[im 51/126  lung]
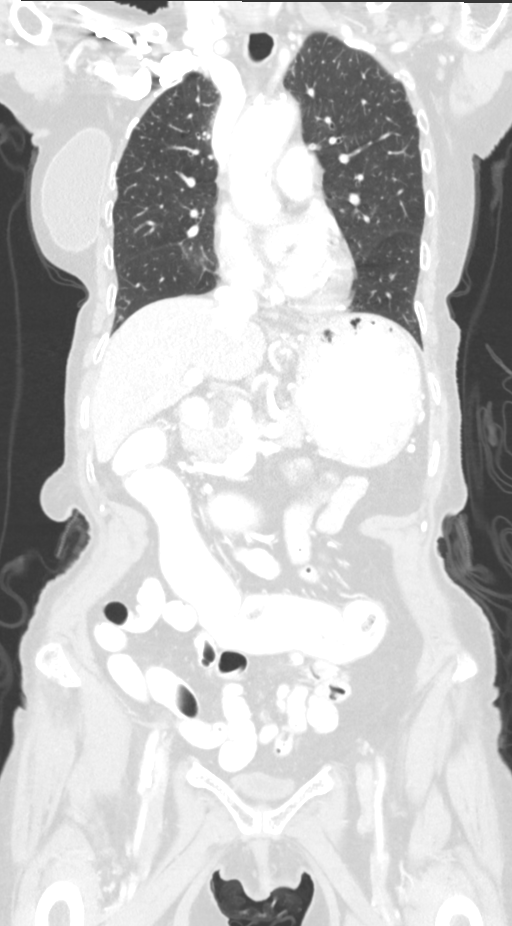
[im 76/126  lung]
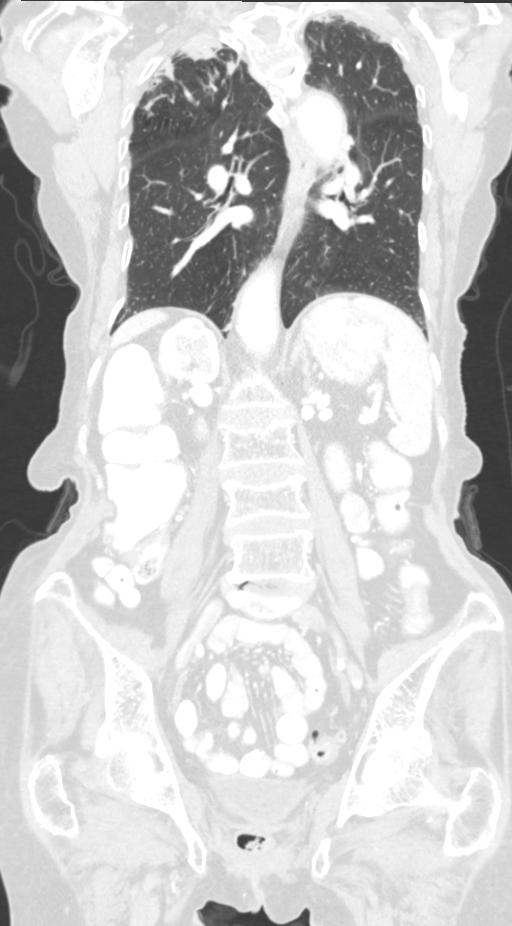

[12 of 36 positions shown; findings below may reference images not displayed]

FINDINGS: CT CHEST FINDINGS

Cardiovascular: Aortic atherosclerosis. Aortic valve calcifications.
Normal heart size. Three-vessel coronary artery calcifications
and/or stents. No pericardial effusion.

Mediastinum/Nodes: No enlarged mediastinal, hilar, or axillary lymph
nodes. Thyroid gland, trachea, and esophagus demonstrate no
significant findings.

Lungs/Pleura: Mild centrilobular emphysema. Severe right apical
pleuroparenchymal scarring with volume loss of the right upper lobe,
this appearance similar to prior examination dated 0771. No pleural
effusion or pneumothorax.

Musculoskeletal: No chest wall mass or suspicious bone lesions
identified. Status post bilateral mastectomy and implant
reconstruction. The left breast implant is ruptured. Osteopenia.
Multiple thoracic wedge deformities, several of which are new or
worsened compared to prior MR dated 03/08/2015, including of the T3
T10 through T12 vertebral bodies. There is kyphoplasty of T9 and
T12.

CT ABDOMEN PELVIS FINDINGS

Hepatobiliary: No focal liver abnormality is seen. Status post
cholecystectomy. Postoperative biliary dilatation.

Pancreas: Unremarkable. No pancreatic ductal dilatation or
surrounding inflammatory changes.

Spleen: Normal in size without significant abnormality.

Adrenals/Urinary Tract: Adrenal glands are unremarkable. Kidneys are
normal, without renal calculi, solid lesion, or hydronephrosis.
Bladder is unremarkable.

Stomach/Bowel: Stomach is within normal limits. Appendix is not
clearly visualized and may be surgically absent. No evidence of
bowel wall thickening, distention, or inflammatory changes. Sigmoid
diverticulosis.

Vascular/Lymphatic: Aortic atherosclerosis. No enlarged abdominal or
pelvic lymph nodes.

Reproductive: Status post hysterectomy.

Other: No abdominal wall hernia or abnormality. No abdominopelvic
ascites.

Musculoskeletal: No acute or significant osseous findings.
IMPRESSION: 1. No CT findings of the chest, abdomen, or pelvis to explain anemia
or weight loss.
2. Status post bilateral mastectomy and implant reconstruction. The
left breast implant is ruptured.
3. Severe right apical pleuroparenchymal scarring with volume loss
of the right upper lobe, this appearance similar to prior
examination dated 0771. Findings are consistent with sequelae of
prior infection or inflammation.
4. Emphysema ([id number]).
5. Coronary artery disease.
6. Aortic Atherosclerosis ([id number]). Aortic valve
calcifications. Correlate with echocardiographic findings for
evidence of valve dysfunction.
7. Diverticulosis without evidence of acute diverticulitis. No
evidence of colon mass or polyp appreciated by CT.
8. Multiple thoracic wedge deformities, several of which are new or
worsened compared to prior MR dated 03/08/2015, including of the T3
and T10 through T12 vertebral bodies. These are age indeterminate.
Status post kyphoplasty of T9 and T12.

## 2021-12-29 ENCOUNTER — Other Ambulatory Visit: Payer: Self-pay | Admitting: Internal Medicine

## 2021-12-29 DIAGNOSIS — R1032 Left lower quadrant pain: Secondary | ICD-10-CM

## 2022-01-02 ENCOUNTER — Other Ambulatory Visit: Payer: Self-pay | Admitting: Internal Medicine

## 2022-01-02 DIAGNOSIS — R1032 Left lower quadrant pain: Secondary | ICD-10-CM

## 2022-01-02 DIAGNOSIS — R1031 Right lower quadrant pain: Secondary | ICD-10-CM

## 2022-01-04 ENCOUNTER — Inpatient Hospital Stay: Admission: RE | Admit: 2022-01-04 | Payer: Medicare PPO | Source: Ambulatory Visit

## 2022-01-11 ENCOUNTER — Ambulatory Visit
Admission: RE | Admit: 2022-01-11 | Discharge: 2022-01-11 | Disposition: A | Discharge status: Home / Self Care | Payer: Medicare PPO | Source: Ambulatory Visit | Attending: Internal Medicine | Admitting: Internal Medicine

## 2022-01-11 DIAGNOSIS — R1031 Right lower quadrant pain: Secondary | ICD-10-CM | POA: Insufficient documentation

## 2022-01-11 DIAGNOSIS — R1032 Left lower quadrant pain: Secondary | ICD-10-CM | POA: Diagnosis present

## 2022-01-11 LAB — POCT I-STAT CREATININE: Creatinine, Ser: 0.9 mg/dL (ref 0.44–1.00)

## 2022-01-11 MED ORDER — IOHEXOL 300 MG/ML  SOLN
80.0000 mL | Freq: Once | INTRAMUSCULAR | Status: AC | PRN
  Administered 2022-01-11: 80 mL via INTRAVENOUS

## 2023-04-09 ENCOUNTER — Other Ambulatory Visit: Payer: Self-pay | Admitting: Internal Medicine

## 2023-04-09 DIAGNOSIS — R531 Weakness: Secondary | ICD-10-CM

## 2023-04-09 DIAGNOSIS — R42 Dizziness and giddiness: Secondary | ICD-10-CM

## 2023-04-09 DIAGNOSIS — R413 Other amnesia: Secondary | ICD-10-CM

## 2023-04-12 ENCOUNTER — Ambulatory Visit
Admission: RE | Admit: 2023-04-12 | Discharge: 2023-04-12 | Disposition: A | Discharge status: Home / Self Care | Payer: Medicare PPO | Source: Ambulatory Visit | Attending: Internal Medicine | Admitting: Internal Medicine

## 2023-04-12 DIAGNOSIS — R531 Weakness: Secondary | ICD-10-CM | POA: Insufficient documentation

## 2023-04-12 DIAGNOSIS — R42 Dizziness and giddiness: Secondary | ICD-10-CM | POA: Diagnosis present

## 2023-04-12 DIAGNOSIS — R413 Other amnesia: Secondary | ICD-10-CM | POA: Diagnosis present

## 2023-08-13 ENCOUNTER — Emergency Department: Payer: Medicare PPO

## 2023-08-13 ENCOUNTER — Observation Stay
Admission: EM | Admit: 2023-08-13 | Discharge: 2023-08-15 | Disposition: A | Discharge status: Home / Self Care | Payer: Medicare PPO | Attending: Internal Medicine | Admitting: Internal Medicine

## 2023-08-13 ENCOUNTER — Other Ambulatory Visit: Payer: Self-pay

## 2023-08-13 DIAGNOSIS — D61818 Other pancytopenia: Secondary | ICD-10-CM | POA: Diagnosis not present

## 2023-08-13 DIAGNOSIS — J441 Chronic obstructive pulmonary disease with (acute) exacerbation: Secondary | ICD-10-CM

## 2023-08-13 DIAGNOSIS — J440 Chronic obstructive pulmonary disease with acute lower respiratory infection: Principal | ICD-10-CM | POA: Insufficient documentation

## 2023-08-13 DIAGNOSIS — F17200 Nicotine dependence, unspecified, uncomplicated: Secondary | ICD-10-CM | POA: Diagnosis present

## 2023-08-13 DIAGNOSIS — Z79899 Other long term (current) drug therapy: Secondary | ICD-10-CM | POA: Insufficient documentation

## 2023-08-13 DIAGNOSIS — J9601 Acute respiratory failure with hypoxia: Secondary | ICD-10-CM | POA: Insufficient documentation

## 2023-08-13 DIAGNOSIS — J101 Influenza due to other identified influenza virus with other respiratory manifestations: Principal | ICD-10-CM | POA: Insufficient documentation

## 2023-08-13 DIAGNOSIS — I5032 Chronic diastolic (congestive) heart failure: Secondary | ICD-10-CM | POA: Diagnosis not present

## 2023-08-13 DIAGNOSIS — E890 Postprocedural hypothyroidism: Secondary | ICD-10-CM | POA: Insufficient documentation

## 2023-08-13 DIAGNOSIS — I2510 Atherosclerotic heart disease of native coronary artery without angina pectoris: Secondary | ICD-10-CM | POA: Diagnosis not present

## 2023-08-13 DIAGNOSIS — J209 Acute bronchitis, unspecified: Secondary | ICD-10-CM | POA: Diagnosis present

## 2023-08-13 DIAGNOSIS — Z87891 Personal history of nicotine dependence: Secondary | ICD-10-CM | POA: Diagnosis not present

## 2023-08-13 DIAGNOSIS — R0602 Shortness of breath: Secondary | ICD-10-CM | POA: Diagnosis present

## 2023-08-13 DIAGNOSIS — I10 Essential (primary) hypertension: Secondary | ICD-10-CM | POA: Diagnosis present

## 2023-08-13 DIAGNOSIS — Z7901 Long term (current) use of anticoagulants: Secondary | ICD-10-CM | POA: Diagnosis not present

## 2023-08-13 DIAGNOSIS — R918 Other nonspecific abnormal finding of lung field: Secondary | ICD-10-CM | POA: Insufficient documentation

## 2023-08-13 LAB — TROPONIN I (HIGH SENSITIVITY)
Troponin I (High Sensitivity): <2 ng/L (ref <18)
Troponin I (High Sensitivity): <2 ng/L (ref <18)

## 2023-08-13 LAB — BASIC METABOLIC PANEL
Anion gap: 14 (ref 5–15)
BUN: 22 mg/dL (ref 8–23)
CO2: 19 mmol/L — ABNORMAL LOW (ref 22–32)
Calcium: 9.1 mg/dL (ref 8.9–10.3)
Chloride: 103 mmol/L (ref 98–111)
Creatinine, Ser: 0.86 mg/dL (ref 0.44–1.00)
GFR, Estimated: >60 mL/min (ref >60)
Glucose, Bld: 186 mg/dL — ABNORMAL HIGH (ref 70–99)
Potassium: 3 mmol/L — ABNORMAL LOW (ref 3.5–5.1)
Sodium: 136 mmol/L (ref 135–145)

## 2023-08-13 LAB — CBC
HCT: 35.8 % — ABNORMAL LOW (ref 36.0–46.0)
Hemoglobin: 11.7 g/dL — ABNORMAL LOW (ref 12.0–15.0)
MCH: 31.7 pg (ref 26.0–34.0)
MCHC: 32.7 g/dL (ref 30.0–36.0)
MCV: 97 fL (ref 80.0–100.0)
Platelets: 132 10*3/uL — ABNORMAL LOW (ref 150–400)
RBC: 3.69 MIL/uL — ABNORMAL LOW (ref 3.87–5.11)
RDW: 12.6 % (ref 11.5–15.5)
WBC: 3 10*3/uL — ABNORMAL LOW (ref 4.0–10.5)
nRBC: 0 % (ref 0.0–0.2)

## 2023-08-13 LAB — LIPASE, BLOOD: Lipase: 36 U/L (ref 11–51)

## 2023-08-13 NOTE — ED Triage Notes (Signed)
 Pt comes via [facility] from home with c/o sob. Pt was dx with pneumonia on Friday. Pt increased weakness. Pt was 88% RA when EMS arrived. Pt has wheezing all over. Pt junky.   Pt given albuterol , duoneb and 125 solu medrol . Pt now at 95% RA.  CBG 176 BP-151/88 98.3-temp

## 2023-08-13 NOTE — ED Triage Notes (Signed)
Pt to ED via [facility] from home c/o shob and abd pain. Pt reports shob started about 45 mins ago. Flu + on Friday. Was seen by doc on Friday and Monday. Denies  CP, fevers, dizziness

## 2023-08-13 NOTE — ED Notes (Signed)
Pt's Daughter [name]) [phone]

## 2023-08-14 ENCOUNTER — Emergency Department: Payer: Medicare PPO

## 2023-08-14 DIAGNOSIS — J441 Chronic obstructive pulmonary disease with (acute) exacerbation: Secondary | ICD-10-CM | POA: Insufficient documentation

## 2023-08-14 DIAGNOSIS — J101 Influenza due to other identified influenza virus with other respiratory manifestations: Principal | ICD-10-CM

## 2023-08-14 DIAGNOSIS — J440 Chronic obstructive pulmonary disease with acute lower respiratory infection: Secondary | ICD-10-CM | POA: Diagnosis not present

## 2023-08-14 DIAGNOSIS — I5032 Chronic diastolic (congestive) heart failure: Secondary | ICD-10-CM | POA: Insufficient documentation

## 2023-08-14 DIAGNOSIS — J209 Acute bronchitis, unspecified: Secondary | ICD-10-CM | POA: Diagnosis present

## 2023-08-14 DIAGNOSIS — D61818 Other pancytopenia: Secondary | ICD-10-CM

## 2023-08-14 DIAGNOSIS — R918 Other nonspecific abnormal finding of lung field: Secondary | ICD-10-CM

## 2023-08-14 MED ORDER — HYDROCODONE-ACETAMINOPHEN 5-325 MG PO TABS: 1.0000 | ORAL_TABLET | ORAL | Status: DC | PRN

## 2023-08-14 MED ORDER — ASPIRIN 81 MG PO TBEC
81.0000 mg | DELAYED_RELEASE_TABLET | Freq: Every day | ORAL | Status: DC
  Administered 2023-08-14 – 2023-08-15 (×2): 81 mg via ORAL
  Filled 2023-08-14 (×2): qty 1

## 2023-08-14 MED ORDER — METHYLPREDNISOLONE SODIUM SUCC 125 MG IJ SOLR
125.0000 mg | Freq: Once | INTRAMUSCULAR | Status: AC
  Administered 2023-08-14: 125 mg via INTRAVENOUS
  Filled 2023-08-14: qty 2

## 2023-08-14 MED ORDER — IPRATROPIUM-ALBUTEROL 0.5-2.5 (3) MG/3ML IN SOLN: 3.0000 mL | Freq: Four times a day (QID) | RESPIRATORY_TRACT | Status: DC

## 2023-08-14 MED ORDER — ACETAMINOPHEN 325 MG PO TABS: 650.0000 mg | ORAL_TABLET | Freq: Four times a day (QID) | ORAL | Status: DC | PRN

## 2023-08-14 MED ORDER — GUAIFENESIN ER 600 MG PO TB12
600.0000 mg | ORAL_TABLET | Freq: Two times a day (BID) | ORAL | Status: DC
  Administered 2023-08-14 – 2023-08-15 (×2): 600 mg via ORAL
  Filled 2023-08-14 (×2): qty 1

## 2023-08-14 MED ORDER — ENOXAPARIN SODIUM 30 MG/0.3ML IJ SOSY
30.0000 mg | PREFILLED_SYRINGE | INTRAMUSCULAR | Status: DC
  Administered 2023-08-14 – 2023-08-15 (×2): 30 mg via SUBCUTANEOUS
  Filled 2023-08-14 (×2): qty 0.3

## 2023-08-14 MED ORDER — IPRATROPIUM-ALBUTEROL 0.5-2.5 (3) MG/3ML IN SOLN
3.0000 mL | Freq: Four times a day (QID) | RESPIRATORY_TRACT | Status: DC
  Administered 2023-08-14 – 2023-08-15 (×5): 3 mL via RESPIRATORY_TRACT
  Filled 2023-08-14 (×5): qty 3

## 2023-08-14 MED ORDER — PRAVASTATIN SODIUM 20 MG PO TABS
80.0000 mg | ORAL_TABLET | Freq: Every evening | ORAL | Status: DC
  Administered 2023-08-14: 80 mg via ORAL
  Filled 2023-08-14: qty 4

## 2023-08-14 MED ORDER — ALBUTEROL SULFATE (2.5 MG/3ML) 0.083% IN NEBU: 2.5000 mg | INHALATION_SOLUTION | RESPIRATORY_TRACT | Status: DC | PRN

## 2023-08-14 MED ORDER — ONDANSETRON HCL 4 MG/2ML IJ SOLN: 4.0000 mg | Freq: Four times a day (QID) | INTRAMUSCULAR | Status: DC | PRN

## 2023-08-14 MED ORDER — PREDNISONE 20 MG PO TABS
40.0000 mg | ORAL_TABLET | Freq: Every day | ORAL | Status: DC
  Administered 2023-08-15: 40 mg via ORAL
  Filled 2023-08-14: qty 2

## 2023-08-14 MED ORDER — ONDANSETRON HCL 4 MG PO TABS: 4.0000 mg | ORAL_TABLET | Freq: Four times a day (QID) | ORAL | Status: DC | PRN

## 2023-08-14 MED ORDER — METHYLPREDNISOLONE SODIUM SUCC 40 MG IJ SOLR
40.0000 mg | Freq: Two times a day (BID) | INTRAMUSCULAR | Status: AC
  Administered 2023-08-14 (×2): 40 mg via INTRAVENOUS
  Filled 2023-08-14 (×2): qty 1

## 2023-08-14 MED ORDER — LEVOTHYROXINE SODIUM 50 MCG PO TABS
75.0000 ug | ORAL_TABLET | Freq: Every day | ORAL | Status: DC
Start: 2023-08-14 — End: 2023-08-15
  Administered 2023-08-14 – 2023-08-15 (×2): 75 ug via ORAL
  Filled 2023-08-14 (×2): qty 2

## 2023-08-14 MED ORDER — IOHEXOL 350 MG/ML SOLN
75.0000 mL | Freq: Once | INTRAVENOUS | Status: AC | PRN
  Administered 2023-08-14: 75 mL via INTRAVENOUS

## 2023-08-14 MED ORDER — ACETAMINOPHEN 650 MG RE SUPP
650.0000 mg | Freq: Four times a day (QID) | RECTAL | Status: DC | PRN
Start: 2023-08-14 — End: 2023-08-15

## 2023-08-14 MED ORDER — IPRATROPIUM-ALBUTEROL 0.5-2.5 (3) MG/3ML IN SOLN
3.0000 mL | RESPIRATORY_TRACT | Status: AC
  Administered 2023-08-14 (×3): 3 mL via RESPIRATORY_TRACT
  Filled 2023-08-14 (×3): qty 3

## 2023-08-14 NOTE — Assessment & Plan Note (Signed)
Continue pravastatin and aspirin.

## 2023-08-14 NOTE — Assessment & Plan Note (Signed)
Suspect suppression related to viral illness Continue to monitor

## 2023-08-14 NOTE — ED Provider Notes (Signed)
 [facility] Provider Note    Event Date/Time   First MD Initiated Contact with Patient 08/14/23 0008     (approximate)   History   Shortness of Breath   HPI  Robin Graves is a 83 y.o. female with history of COPD, emphysema, continued tobacco use, hyperlipidemia who presents to the emergency department with cough, shortness of breath and wheezing.  Was hypoxic on EMS arrival to 88 %.  This improved after breathing treatments.  Patient was given albuterol , DuoNeb, Solu-Medrol  with EMS.  No chest pain.  Did test positive for influenza A yesterday.   History provided by patient.    Past Medical History:  Diagnosis Date   Anemia    Arthritis    BACK   Chicken pox    Colon polyps    COPD (chronic obstructive pulmonary disease) (HCC)    Diverticulosis    Emphysema of lung (HCC)    GERD (gastroesophageal reflux disease)    Graves disease    Graves' disease with exophthalmos    REMOVED ON THYROID MEDS   Headache    MIGRAINES, 1 EVERY DAY OR LESS OFTEN   HH (hiatus hernia)    Hyperlipidemia    Pernicious anemia    Stroke (HCC)    MINI STROKES 1995 A FEW BEFORE 1995   Wears dentures    UPPER AND LOWER    Past Surgical History:  Procedure Laterality Date   ABDOMINAL HYSTERECTOMY  1974   AUGMENTATION MAMMAPLASTY Bilateral 1976   silicone   AUGMENTATION MAMMAPLASTY Bilateral 1996   saline   BACK SURGERY  JUNE,4,2015   HURT AT WORK   BREAST SURGERY Bilateral , 1973   history of leakage   CATARACT EXTRACTION W/PHACO Right 04/20/2015   Procedure: CATARACT EXTRACTION PHACO AND INTRAOCULAR LENS PLACEMENT (IOC);  Surgeon: Dene Etienne, MD;  Location: [facility];  Service: Ophthalmology;  Laterality: Right;   CATARACT EXTRACTION W/PHACO Left 01/23/2017   Procedure: CATARACT EXTRACTION PHACO AND INTRAOCULAR LENS PLACEMENT (IOC)  Left;  Surgeon: Etienne Dene, MD;  Location: [facility];  Service: Ophthalmology;  Laterality:  Left;   CHOLECYSTECTOMY     COLONOSCOPY WITH PROPOFOL  N/A 05/16/2015   Procedure: COLONOSCOPY WITH PROPOFOL ;  Surgeon: Donnice Vaughn Manes, MD;  Location: [facility];  Service: Endoscopy;  Laterality: N/A;   ESOPHAGOGASTRODUODENOSCOPY (EGD) WITH PROPOFOL  N/A 05/16/2015   Procedure: ESOPHAGOGASTRODUODENOSCOPY (EGD) WITH PROPOFOL ;  Surgeon: Donnice Vaughn Manes, MD;  Location: [facility];  Service: Endoscopy;  Laterality: N/A;   EYE SURGERY     KYPHOPLASTY  12/10/2013   T12   KYPHOPLASTY N/A 11/08/2016   Procedure: KYPHOPLASTY- T9;  Surgeon: Ozell Flake, MD;  Location: [facility];  Service: Orthopedics;  Laterality: N/A;   MASTECTOMY Bilateral 1970's   subcutaneous mastectomy done for preventative reasons with reconstruction   thyroid ablation      MEDICATIONS:  Prior to Admission medications   Medication Sig Start Date End Date Taking? Authorizing Provider  aspirin  EC 81 MG tablet Take 81 mg by mouth daily. AM    [provider]  cyanocobalamin (,VITAMIN B-12,) 1000 MCG/ML injection INJECT 1 ML AS DIRECTED MONTHLY 10/03/15   Montell Oneil LABOR, MD  ferrous sulfate  325 (65 FE) MG tablet Take 1 tablet (325 mg total) by mouth daily with breakfast. 06/30/15   Montell Oneil LABOR, MD  HYDROcodone -acetaminophen  (NORCO) 5-325 MG tablet Take 1-2 tablets by mouth every 6 (six) hours as needed for moderate pain. Patient not taking: Reported  on 01/16/2017 11/08/16   Kathlynn Sharper, MD  HYDROcodone -acetaminophen  (NORCO/VICODIN) 5-325 MG tablet Take 1 tablet by mouth every 4 (four) hours as needed for moderate pain.    [provider]  ibuprofen  (ADVIL ,MOTRIN ) 800 MG tablet TAKE ONE TABLET BY MOUTH THREE TIMES DAILY AS NEEDED Patient taking differently: TAKE ONE TABLET BY MOUTH THREE TIMES DAILY AS NEEDED FOR PAIN 08/27/15   Montell Oneil LABOR, MD  levothyroxine  (SYNTHROID , LEVOTHROID) 75 MCG tablet Take 75 mcg by mouth daily before breakfast. 10/08/16   [provider]  omeprazole  (PRILOSEC) 20 MG capsule Take 20 mg by mouth 2 (two) times daily.     [provider]  Polyethyl Glycol-Propyl Glycol (SYSTANE OP) Apply 1 drop to eye daily.    [provider]  polyethylene glycol (MIRALAX  / GLYCOLAX ) packet Take 17 g by mouth daily as needed for mild constipation or moderate constipation.    [provider]  potassium chloride  (K-DUR,KLOR-CON ) 10 MEQ tablet Take 10 mEq by mouth 2 (two) times daily.    [provider]  pravastatin  (PRAVACHOL ) 80 MG tablet Take 1 tablet (80 mg total) by mouth daily. 06/30/15   Montell Oneil LABOR, MD  traMADol  (ULTRAM ) 50 MG tablet Take 1 tablet every 6-8 hours prn pain Patient not taking: Reported on 01/16/2017 10/15/16   Saunders Shona CROME, PA-C    Physical Exam   Triage Vital Signs: ED Triage Vitals  Encounter Vitals Group     BP 08/13/23 1915 119/61     Systolic BP Percentile --      Diastolic BP Percentile --      Pulse Rate 08/13/23 1915 (!) 112     Resp 08/13/23 1915 18     Temp 08/13/23 1915 98 F (36.7 C)     Temp Source 08/13/23 2220 Oral     SpO2 08/13/23 1915 95 %     Weight 08/13/23 1916 90 lb (40.8 kg)     Height 08/13/23 1916 5' (1.524 m)     Head Circumference --      Peak Flow --      Pain Score 08/13/23 1916 3     Pain Loc --      Pain Education --      Exclude from Growth Chart --     Most recent vital signs: Vitals:   08/13/23 2220 08/14/23 0237  BP: 104/60   Pulse: 88   Resp: 18   Temp: 98.3 F (36.8 C) 98.2 F (36.8 C)  SpO2: 93%     CONSTITUTIONAL: Alert, responds appropriately to questions.  Elderly, chronically ill-appearing HEAD: Normocephalic, atraumatic EYES: Conjunctivae clear, pupils appear equal, sclera nonicteric ENT: normal nose; moist mucous membranes NECK: Supple, normal ROM CARD: RRR; S1 and S2 appreciated RESP: Normal chest excursion without splinting or tachypnea; patient has diffuse inspiratory and expiratory wheezes, rhonchorous breath sounds, no  rales, no hypoxia currently, no respiratory distress, speaking full sentences ABD/GI: Non-distended; soft, non-tender, no rebound, no guarding, no peritoneal signs BACK: The back appears normal EXT: Normal ROM in all joints; no deformity noted, no edema SKIN: Normal color for age and race; warm; no rash on exposed skin NEURO: Moves all extremities equally, normal speech PSYCH: The patient's mood and manner are appropriate.   ED Results / Procedures / Treatments   LABS: (all labs ordered are listed, but only abnormal results are displayed) Labs Reviewed  CBC - Abnormal; Notable for the following components:      Result Value  WBC 3.0 (*)    RBC 3.69 (*)    Hemoglobin 11.7 (*)    HCT 35.8 (*)    Platelets 132 (*)    All other components within normal limits  BASIC METABOLIC PANEL - Abnormal; Notable for the following components:   Potassium 3.0 (*)    CO2 19 (*)    Glucose, Bld 186 (*)    All other components within normal limits  LIPASE, BLOOD  TROPONIN I (HIGH SENSITIVITY)  TROPONIN I (HIGH SENSITIVITY)     EKG:  EKG Interpretation Date/Time:  Tuesday August 13 2023 19:22:41 EST Ventricular Rate:  104 PR Interval:  136 QRS Duration:  78 QT Interval:  396 QTC Calculation: 520 R Axis:   84  Text Interpretation: Sinus tachycardia Septal infarct (cited on or before 06-Nov-2016) ST & T wave abnormality, consider inferior ischemia Prolonged QT Abnormal ECG When compared with ECG of 06-Nov-2016 11:38, Vent. rate has increased BY  44 BPM ST now depressed in Inferior leads QT has lengthened Confirmed by Delmus Warwick, Josette [phone]) on 08/14/2023 4:48:02 AM         RADIOLOGY: My personal review and interpretation of imaging: Chest x-ray shows nodular density in the anterior upper thorax.  CT chest shows no PE but does show right upper lobe nodule concerning for lung cancer.  I have personally reviewed all radiology reports.   CT Angio Chest PE W and/or Wo Contrast Result Date:  08/14/2023 CLINICAL DATA:  Shortness of breath, abdominal pain, positive flu test on 4 days ago. Also, PA and lateral chest yesterday demonstrated suspected mass in the retrosternal clear space on the lateral view only. EXAM: CT ANGIOGRAPHY CHEST WITH CONTRAST TECHNIQUE: Multidetector CT imaging of the chest was performed using the standard protocol during bolus administration of intravenous contrast. Multiplanar CT image reconstructions and MIPs were obtained to evaluate the vascular anatomy. RADIATION DOSE REDUCTION: This exam was performed according to the departmental dose-optimization program which includes automated exposure control, adjustment of the mA and/or kV according to patient size and/or use of iterative reconstruction technique. CONTRAST:  75mL OMNIPAQUE  IOHEXOL  350 MG/ML SOLN COMPARISON:  PA and lateral chest yesterday, and chest, abdomen and pelvis CT with IV contrast 04/29/2020. FINDINGS: Cardiovascular: The cardiac size is normal. There is no pericardial effusion. Three-vessel coronary artery calcifications greatest in the LAD, additional scattered calcification in the left main coronary artery. There is moderate calcification and thickening of the aortic valve leaflets. Consider echocardiographic evaluation. The aorta is normal in caliber with mild tortuosity and moderate calcific plaques, with atherosclerosis in the great vessels. Pulmonary arteries are normal in caliber without evidence of thromboemboli. The pulmonary veins are slightly distended but no more than previously. Mediastinum/Nodes: No enlarged mediastinal, hilar, or axillary lymph nodes. Thyroid gland, trachea, and esophagus demonstrate no significant findings. There is a small hiatal hernia. Lungs/Pleura: There is right-greater-than-left biapical pleural-parenchymal scarring, with right apical volume loss and upward hilar retraction and left apical linear calcifications within the scarring. On the right scarring changes merge with  coarsely nodular chronic scar-like opacities. There are mild centrilobular emphysematous changes in the lungs. Anteriorly in the base of the right upper lobe there is new demonstration of a lobular solid nodule with pleural stranding measuring 2.1 x 2 x 1.8 cm (measured on 5:56 and 7:32). This is highly worrisome for a primary bronchogenic neoplasm. PET-CT or tissue sampling recommended. In the left upper lobe a new nodule is also noted on 5:34, with slight cavitation and measuring 8 x  6 mm. This could be a neoplasm or an inflammatory nodule. In the left lower lobe, there is a chronic stable 4 mm nodule on 5:97. There are scattered linear scar-like opacities in the bases. There is no consolidation, effusion or further nodules. Upper Abdomen: No acute abnormality. Status post cholecystectomy with chronically prominent common bile duct. Abdominal aortic atherosclerosis. Musculoskeletal: There are multilevel thoracic spine chronic compression fractures, with old kyphoplasty at T9 and 12. Osteopenia and degenerative change with thoracic kyphosis. No new or progressive thoracic compression fractures. The ribcage is intact. No destructive bone lesion. Bilateral breast implants noted with chronic collapse of the left implant. Review of the MIP images confirms the above findings. IMPRESSION: 1. No evidence of arterial dilatation or thromboembolism. 2. 2.1 x 2 x 1.8 cm lobular solid nodule with pleural stranding in the base of the right upper lobe anteriorly, highly worrisome for a primary bronchogenic neoplasm. PET-CT or tissue sampling recommended. 3. Additional new 8 x 6 mm cavitary nodule in the left upper lobe, could be a neoplasm or an inflammatory nodule. 4. Emphysema. 5. Aortic and coronary artery atherosclerosis. 6. Moderate calcification and thickening of the aortic valve leaflets. Consider echocardiographic evaluation. 7. Small hiatal hernia. 8. Osteopenia and degenerative change with multilevel thoracic spine  chronic compression fractures, with old kyphoplasty at T9 and 12. 9. 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 Aortic Atherosclerosis (ICD10-I70.0) and Emphysema (ICD10-J43.9). Electronically Signed   By: Francis Quam M.D.   On: 08/14/2023 01:21   DG Chest 2 View Result Date: 08/13/2023 CLINICAL DATA:  Shortness of breath. EXAM: CHEST - 2 VIEW COMPARISON:  07/04/2013.  Chest CT dated 04/29/2020. FINDINGS: Enlarged cardiac silhouette. Hyperexpanded lungs. Interval nodular density overlying the anterior upper thorax on the lateral view, not seen on the frontal view. No significant change in right greater than left biapical pleural and parenchymal scarring. Multiple thoracic and lumbar compression deformities are again demonstrated with kyphoplasty at 2 levels. Diffuse osteopenia. Mild-to-moderate dextroconvex thoracolumbar scoliosis. Cholecystectomy clips. Atheromatous aortic calcifications. IMPRESSION: 1. Interval nodular density overlying the anterior upper thorax on the lateral view, not seen on the frontal view. Recommend chest CT for further evaluation. 2. COPD. 3. Cardiomegaly. Electronically Signed   By: Elspeth Bathe M.D.   On: 08/13/2023 19:39     PROCEDURES:  Critical Care performed: Yes, see critical care procedure note(s)   CRITICAL CARE Performed by: Josette Pernell Dikes   Total critical care time: 40 minutes  Critical care time was exclusive of separately billable procedures and treating other patients.  Critical care was necessary to treat or prevent imminent or life-threatening deterioration.  Critical care was time spent personally by me on the following activities: development of treatment plan with patient and/or surrogate as well as nursing, discussions with consultants, evaluation of patient's response to treatment, examination of patient, obtaining history from patient or surrogate, ordering and performing treatments and interventions, ordering and review of laboratory studies,  ordering and review of radiographic studies, pulse oximetry and re-evaluation of patient's condition.   Procedures    IMPRESSION / MDM / ASSESSMENT AND PLAN / ED COURSE  I reviewed the triage vital signs and the nursing notes.    Patient here for shortness of breath, wheezing.  Tested positive for the flu yesterday.    DIFFERENTIAL DIAGNOSIS (includes but not limited to):   Influenza, pneumonia, pneumothorax, COPD exacerbation, PE, CHF, malignancy   Patient's presentation is most consistent with acute presentation with potential threat to life or bodily function.  PLAN: Workup initiated from triage.  Patient has a leukopenia consistent with viral illness.  Chronic mild thrombocytopenia which is stable.  Troponin x 2 negative.  Chest x-ray reviewed and interpreted by myself and the radiologist and shows a nodular density in the anterior upper thorax.  Will obtain CTA of the chest.  Will give breathing treatments, continued steroids here.  I feel patient will need admission.   MEDICATIONS GIVEN IN ED: Medications  aspirin  EC tablet 81 mg (has no administration in time range)  pravastatin  (PRAVACHOL ) tablet 80 mg (has no administration in time range)  levothyroxine  (SYNTHROID ) tablet 75 mcg (has no administration in time range)  enoxaparin  (LOVENOX ) injection 30 mg (has no administration in time range)  acetaminophen  (TYLENOL ) tablet 650 mg (has no administration in time range)    Or  acetaminophen  (TYLENOL ) suppository 650 mg (has no administration in time range)  ondansetron  (ZOFRAN ) tablet 4 mg (has no administration in time range)    Or  ondansetron  (ZOFRAN ) injection 4 mg (has no administration in time range)  methylPREDNISolone  sodium succinate (SOLU-MEDROL ) 40 mg/mL injection 40 mg (has no administration in time range)    Followed by  predniSONE  (DELTASONE ) tablet 40 mg (has no administration in time range)  albuterol  (PROVENTIL ) (2.5 MG/3ML) 0.083% nebulizer solution 2.5  mg (has no administration in time range)  guaiFENesin  (MUCINEX ) 12 hr tablet 600 mg (600 mg Oral Patient Refused/Not Given 08/14/23 0309)  ipratropium-albuterol  (DUONEB) 0.5-2.5 (3) MG/3ML nebulizer solution 3 mL (has no administration in time range)  iohexol  (OMNIPAQUE ) 350 MG/ML injection 75 mL (75 mLs Intravenous Contrast Given 08/14/23 0025)  ipratropium-albuterol  (DUONEB) 0.5-2.5 (3) MG/3ML nebulizer solution 3 mL (3 mLs Nebulization Given 08/14/23 0104)  methylPREDNISolone  sodium succinate (SOLU-MEDROL ) 125 mg/2 mL injection 125 mg (125 mg Intravenous Given 08/14/23 0043)     ED COURSE: CT scan reviewed and interpreted by myself and radiologist and shows no PE or pneumonia.  She does however have a nodular density in the right upper lobe and the left upper lobe that are concerning for neoplasm.  She will need workup for this as an outpatient.  Will admit to the hospitalist for COPD exacerbation in the setting of influenza with intermittent hypoxia.   At this time, I do not feel there is any life-threatening condition present. I reviewed all nursing notes, vitals, pertinent previous records.  All lab and urine results, EKGs, imaging ordered have been independently reviewed and interpreted by myself.  I reviewed all available radiology reports from any imaging ordered this visit.  Based on my assessment, I feel the patient is safe to be discharged home without further emergent workup and can continue workup as an outpatient as needed. Discussed all findings, treatment plan as well as usual and customary return precautions.  They verbalize understanding and are comfortable with this plan.  Outpatient follow-up has been provided as needed.  All questions have been answered.    CONSULTS:  none   OUTSIDE RECORDS REVIEWED: Reviewed PCP note from 08/12/2023.       FINAL CLINICAL IMPRESSION(S) / ED DIAGNOSES   Final diagnoses:  Influenza A  COPD with acute exacerbation (HCC)  Acute respiratory  failure with hypoxia (HCC)     Rx / DC Orders   ED Discharge Orders     None        Note:  This document was prepared using Dragon voice recognition software and may include unintentional dictation errors.   Aqeel Norgaard, Josette SAILOR, DO 08/14/23 [phone]

## 2023-08-14 NOTE — ED Notes (Signed)
 Assisted to restroom via w/c by Loura Rower

## 2023-08-14 NOTE — Progress Notes (Signed)
 PHARMACIST - PHYSICIAN COMMUNICATION  CONCERNING:  Enoxaparin  (Lovenox ) for DVT Prophylaxis    RECOMMENDATION: Patient was prescribed enoxaprin 40mg  q24 hours for VTE prophylaxis.   Filed Weights   08/13/23 1916  Weight: 40.8 kg (90 lb)    Body mass index is 17.58 kg/m.  Estimated Creatinine Clearance: 31.9 mL/min (by C-G formula based on SCr of 0.86 mg/dL).  Patient is candidate for enoxaparin  30mg  every 24 hours based on CrCl <59ml/min or Weight <45kg  DESCRIPTION: Pharmacy has adjusted enoxaparin  dose per [facility] policy.  Patient is now receiving enoxaparin  30 mg every 24 hours   Rankin CANDIE Dills, PharmD, [facility] 08/14/2023 2:43 AM

## 2023-08-14 NOTE — Progress Notes (Signed)
 Brief rounding note, same day as admission  HPI: Pt admitted after midnight with COPD exacerbation triggered by influenza A infection.  Interval history: Pt seen in the ED holding for a bed this AM. She reports feeling better and asking when she might go home.  Exam:  General exam: awake, alert, no acute distress, frail HEENT: moist mucus membranes, hearing grossly normal  Respiratory system: generally diminished breath sounds, no active wheezes, no rhonchi, normal respiratory effort at rest on room air Cardiovascular system: normal S1/S2, tachycardic, regular rhythm, no pedal edema.   Gastrointestinal system: soft, NT, ND, no HSM felt, +bowel sounds. Central nervous system: A&O x2+. no gross focal neurologic deficits, normal speech Extremities: moves all, no edema, normal tone Skin: dry, intact, normal temperature Psychiatry: normal mood, congruent affect   A&P: as per H&P by Dr. Cleatus, with any changes or additions as below:   BP's remain soft and ongoing tachycardia today.  --Continue IV steroids and transition to prednisone  tomorrow if ongoing improvement --Change level of care to med/telemetry with cardiac monitoring given tachycardia    No charge

## 2023-08-14 NOTE — Assessment & Plan Note (Signed)
Not on any related meds per med review

## 2023-08-14 NOTE — Assessment & Plan Note (Signed)
Recommendation for PET scan Consider inpatient pulmonology versus outpatient

## 2023-08-14 NOTE — ED Notes (Signed)
Denton Lank, DO made aware of patient's BP of 81/57 and HR 119 at this time.

## 2023-08-14 NOTE — Assessment & Plan Note (Addendum)
 Influenza A Bronchodilators and steroids  Flutter valve Antitussives Was hypoxic with EMS now resolved with nebulized treatments

## 2023-08-14 NOTE — H&P (Signed)
 History and Physical    Patient: Robin Graves FMW:[id number] DOB: 1941-04-19 DOA: 08/13/2023 DOS: the patient was seen and examined on 08/14/2023 PCP: Sadie Manna, MD  Patient coming from: Home  Chief Complaint:  Chief Complaint  Patient presents with   Shortness of Breath    HPI: [name] is a 83 y.o. female with medical history significant for COPD, diastolic CHF, CAD , tobacco use disorder who presents to the ED with persistent shortness of breath and wheezing following a diagnosis of influenza A the day prior at her PCP.  O2 sat 88% with EMS, improving to 95% following treatment with albuterol  and Solu-Medrol  ED course: Mild tachycardia to 112 with otherwise normal vitals Workup.  Mild pancytopenia with WBC 3, hemoglobin 11.7 and platelets 132 Potassium 3 EKG personally viewed and interpreted showing sinus tachycardia at 104 with no acute ST-T wave changes CTA chest negative for PE but showing findings concerning for primary bronchogenic neoplasm left upper lobe with recommendation for PET scan.  Also shows a cavitary nodule in the left upper lobe.  Patient treated with DuoNebs and Solu-Medrol  Hospitalist consulted for admission.   Review of Systems: As mentioned in the history of present illness. All other systems reviewed and are negative.  Past Medical History:  Diagnosis Date   Anemia    Arthritis    BACK   Chicken pox    Colon polyps    COPD (chronic obstructive pulmonary disease) (HCC)    Diverticulosis    Emphysema of lung (HCC)    GERD (gastroesophageal reflux disease)    Graves disease    Graves' disease with exophthalmos    REMOVED ON THYROID MEDS   Headache    MIGRAINES, 1 EVERY DAY OR LESS OFTEN   HH (hiatus hernia)    Hyperlipidemia    Pernicious anemia    Stroke (HCC)    MINI STROKES 1995 A FEW BEFORE 1995   Wears dentures    UPPER AND LOWER   Past Surgical History:  Procedure Laterality Date   ABDOMINAL HYSTERECTOMY  1974   AUGMENTATION  MAMMAPLASTY Bilateral 1976   silicone   AUGMENTATION MAMMAPLASTY Bilateral 1996   saline   BACK SURGERY  JUNE,4,2015   HURT AT WORK   BREAST SURGERY Bilateral , 1973   history of leakage   CATARACT EXTRACTION W/PHACO Right 04/20/2015   Procedure: CATARACT EXTRACTION PHACO AND INTRAOCULAR LENS PLACEMENT (IOC);  Surgeon: Dene Etienne, MD;  Location: [facility];  Service: Ophthalmology;  Laterality: Right;   CATARACT EXTRACTION W/PHACO Left 01/23/2017   Procedure: CATARACT EXTRACTION PHACO AND INTRAOCULAR LENS PLACEMENT (IOC)  Left;  Surgeon: Etienne Dene, MD;  Location: [facility];  Service: Ophthalmology;  Laterality: Left;   CHOLECYSTECTOMY     COLONOSCOPY WITH PROPOFOL  N/A 05/16/2015   Procedure: COLONOSCOPY WITH PROPOFOL ;  Surgeon: Donnice Vaughn Manes, MD;  Location: [facility];  Service: Endoscopy;  Laterality: N/A;   ESOPHAGOGASTRODUODENOSCOPY (EGD) WITH PROPOFOL  N/A 05/16/2015   Procedure: ESOPHAGOGASTRODUODENOSCOPY (EGD) WITH PROPOFOL ;  Surgeon: Donnice Vaughn Manes, MD;  Location: [facility] ENDOSCOPY;  Service: Endoscopy;  Laterality: N/A;   EYE SURGERY     KYPHOPLASTY  12/10/2013   T12   KYPHOPLASTY N/A 11/08/2016   Procedure: KYPHOPLASTY- T9;  Surgeon: Ozell Flake, MD;  Location: [facility];  Service: Orthopedics;  Laterality: N/A;   MASTECTOMY Bilateral 1970's   subcutaneous mastectomy done for preventative reasons with reconstruction   thyroid ablation     Social History:  reports that she  quit smoking about 15 years ago. Her smoking use included cigarettes. She started smoking about 19 years ago. She has a 1 pack-year smoking history. She has never used smokeless tobacco. She reports that she does not drink alcohol  and does not use drugs.  Allergies  Allergen Reactions   Codeine     unknown    Family History  Problem Relation Age of Onset   Coronary artery disease Mother    Stroke Father    Heart attack Father    Cancer Brother        colon    Stroke Brother    Heart attack Brother    Diabetes Brother     Prior to Admission medications   Medication Sig Start Date End Date Taking? Authorizing Provider  aspirin  EC 81 MG tablet Take 81 mg by mouth daily. AM    [provider]  cyanocobalamin (,VITAMIN B-12,) 1000 MCG/ML injection INJECT 1 ML AS DIRECTED MONTHLY 10/03/15   Montell Oneil LABOR, MD  ferrous sulfate  325 (65 FE) MG tablet Take 1 tablet (325 mg total) by mouth daily with breakfast. 06/30/15   Montell Oneil LABOR, MD  HYDROcodone -acetaminophen  (NORCO) 5-325 MG tablet Take 1-2 tablets by mouth every 6 (six) hours as needed for moderate pain. Patient not taking: Reported on 01/16/2017 11/08/16   Kathlynn Sharper, MD  HYDROcodone -acetaminophen  (NORCO/VICODIN) 5-325 MG tablet Take 1 tablet by mouth every 4 (four) hours as needed for moderate pain.    [provider]  ibuprofen  (ADVIL ,MOTRIN ) 800 MG tablet TAKE ONE TABLET BY MOUTH THREE TIMES DAILY AS NEEDED Patient taking differently: TAKE ONE TABLET BY MOUTH THREE TIMES DAILY AS NEEDED FOR PAIN 08/27/15   Montell Oneil LABOR, MD  levothyroxine  (SYNTHROID , LEVOTHROID) 75 MCG tablet Take 75 mcg by mouth daily before breakfast. 10/08/16   [provider]  omeprazole (PRILOSEC) 20 MG capsule Take 20 mg by mouth 2 (two) times daily.     [provider]  Polyethyl Glycol-Propyl Glycol (SYSTANE OP) Apply 1 drop to eye daily.    [provider]  polyethylene glycol (MIRALAX  / GLYCOLAX ) packet Take 17 g by mouth daily as needed for mild constipation or moderate constipation.    [provider]  potassium chloride  (K-DUR,KLOR-CON ) 10 MEQ tablet Take 10 mEq by mouth 2 (two) times daily.    [provider]  pravastatin  (PRAVACHOL ) 80 MG tablet Take 1 tablet (80 mg total) by mouth daily. 06/30/15   Montell Oneil LABOR, MD  traMADol  (ULTRAM ) 50 MG tablet Take 1 tablet every 6-8 hours prn pain Patient not taking: Reported on 01/16/2017 10/15/16    Saunders Shona CROME, PA-C    Physical Exam: Vitals:   08/13/23 1915 08/13/23 1916 08/13/23 2220  BP: 119/61  104/60  Pulse: (!) 112  88  Resp: 18  18  Temp: 98 F (36.7 C)  98.3 F (36.8 C)  TempSrc:   Oral  SpO2: 95%  93%  Weight:  40.8 kg   Height:  5' (1.524 m)    Physical Exam Vitals and nursing note reviewed.  Constitutional:      General: She is not in acute distress. HENT:     Head: Normocephalic and atraumatic.  Cardiovascular:     Rate and Rhythm: Normal rate and regular rhythm.     Heart sounds: Normal heart sounds.  Pulmonary:     Effort: Tachypnea present.     Breath sounds: Decreased breath sounds present.  Abdominal:     Palpations: Abdomen  is soft.     Tenderness: There is no abdominal tenderness.  Neurological:     Mental Status: Mental status is at baseline.     Motor: Tremor present.     Labs on Admission: I have personally reviewed following labs and imaging studies  CBC: Recent Labs  Lab 08/13/23 1918  WBC 3.0*  HGB 11.7*  HCT 35.8*  MCV 97.0  PLT 132*   Basic Metabolic Panel: Recent Labs  Lab 08/13/23 1918  NA 136  K 3.0*  CL 103  CO2 19*  GLUCOSE 186*  BUN 22  CREATININE 0.86  CALCIUM 9.1   GFR: Estimated Creatinine Clearance: 31.9 mL/min (by C-G formula based on SCr of 0.86 mg/dL). Liver Function Tests: No results for input(s): AST, ALT, ALKPHOS, BILITOT, PROT, ALBUMIN in the last 168 hours. Recent Labs  Lab 08/13/23 1919  LIPASE 36   No results for input(s): AMMONIA in the last 168 hours. Coagulation Profile: No results for input(s): INR, PROTIME in the last 168 hours. Cardiac Enzymes: No results for input(s): CKTOTAL, CKMB, CKMBINDEX, TROPONINI in the last 168 hours. BNP (last 3 results) No results for input(s): PROBNP in the last 8760 hours. HbA1C: No results for input(s): HGBA1C in the last 72 hours. CBG: No results for input(s): GLUCAP in the last 168 hours. Lipid Profile: No  results for input(s): CHOL, HDL, LDLCALC, TRIG, CHOLHDL, LDLDIRECT in the last 72 hours. Thyroid Function Tests: No results for input(s): TSH, T4TOTAL, FREET4, T3FREE, THYROIDAB in the last 72 hours. Anemia Panel: No results for input(s): VITAMINB12, FOLATE, FERRITIN, TIBC, IRON, RETICCTPCT in the last 72 hours. Urine analysis:    Component Value Date/Time   COLORURINE YELLOW (A) 10/15/2016 1613   APPEARANCEUR HAZY (A) 10/15/2016 1613   APPEARANCEUR Clear 12/11/2012 0347   LABSPEC 1.026 10/15/2016 1613   LABSPEC 1.011 12/11/2012 0347   PHURINE 5.0 10/15/2016 1613   GLUCOSEU NEGATIVE 10/15/2016 1613   GLUCOSEU Negative 12/11/2012 0347   HGBUR NEGATIVE 10/15/2016 1613   BILIRUBINUR NEGATIVE 10/15/2016 1613   BILIRUBINUR Negative 12/11/2012 0347   KETONESUR NEGATIVE 10/15/2016 1613   PROTEINUR 100 (A) 10/15/2016 1613   NITRITE NEGATIVE 10/15/2016 1613   LEUKOCYTESUR MODERATE (A) 10/15/2016 1613   LEUKOCYTESUR 2+ 12/11/2012 0347    Radiological Exams on Admission: CT Angio Chest PE W and/or Wo Contrast Result Date: 08/14/2023 CLINICAL DATA:  Shortness of breath, abdominal pain, positive flu test on 4 days ago. Also, PA and lateral chest yesterday demonstrated suspected mass in the retrosternal clear space on the lateral view only. EXAM: CT ANGIOGRAPHY CHEST WITH CONTRAST TECHNIQUE: Multidetector CT imaging of the chest was performed using the standard protocol during bolus administration of intravenous contrast. Multiplanar CT image reconstructions and MIPs were obtained to evaluate the vascular anatomy. RADIATION DOSE REDUCTION: This exam was performed according to the departmental dose-optimization program which includes automated exposure control, adjustment of the mA and/or kV according to patient size and/or use of iterative reconstruction technique. CONTRAST:  75mL OMNIPAQUE  IOHEXOL  350 MG/ML SOLN COMPARISON:  PA and lateral chest yesterday, and chest,  abdomen and pelvis CT with IV contrast 04/29/2020. FINDINGS: Cardiovascular: The cardiac size is normal. There is no pericardial effusion. Three-vessel coronary artery calcifications greatest in the LAD, additional scattered calcification in the left main coronary artery. There is moderate calcification and thickening of the aortic valve leaflets. Consider echocardiographic evaluation. The aorta is normal in caliber with mild tortuosity and moderate calcific plaques, with atherosclerosis in the great vessels. Pulmonary arteries  are normal in caliber without evidence of thromboemboli. The pulmonary veins are slightly distended but no more than previously. Mediastinum/Nodes: No enlarged mediastinal, hilar, or axillary lymph nodes. Thyroid gland, trachea, and esophagus demonstrate no significant findings. There is a small hiatal hernia. Lungs/Pleura: There is right-greater-than-left biapical pleural-parenchymal scarring, with right apical volume loss and upward hilar retraction and left apical linear calcifications within the scarring. On the right scarring changes merge with coarsely nodular chronic scar-like opacities. There are mild centrilobular emphysematous changes in the lungs. Anteriorly in the base of the right upper lobe there is new demonstration of a lobular solid nodule with pleural stranding measuring 2.1 x 2 x 1.8 cm (measured on 5:56 and 7:32). This is highly worrisome for a primary bronchogenic neoplasm. PET-CT or tissue sampling recommended. In the left upper lobe a new nodule is also noted on 5:34, with slight cavitation and measuring 8 x 6 mm. This could be a neoplasm or an inflammatory nodule. In the left lower lobe, there is a chronic stable 4 mm nodule on 5:97. There are scattered linear scar-like opacities in the bases. There is no consolidation, effusion or further nodules. Upper Abdomen: No acute abnormality. Status post cholecystectomy with chronically prominent common bile duct. Abdominal  aortic atherosclerosis. Musculoskeletal: There are multilevel thoracic spine chronic compression fractures, with old kyphoplasty at T9 and 12. Osteopenia and degenerative change with thoracic kyphosis. No new or progressive thoracic compression fractures. The ribcage is intact. No destructive bone lesion. Bilateral breast implants noted with chronic collapse of the left implant. Review of the MIP images confirms the above findings. IMPRESSION: 1. No evidence of arterial dilatation or thromboembolism. 2. 2.1 x 2 x 1.8 cm lobular solid nodule with pleural stranding in the base of the right upper lobe anteriorly, highly worrisome for a primary bronchogenic neoplasm. PET-CT or tissue sampling recommended. 3. Additional new 8 x 6 mm cavitary nodule in the left upper lobe, could be a neoplasm or an inflammatory nodule. 4. Emphysema. 5. Aortic and coronary artery atherosclerosis. 6. Moderate calcification and thickening of the aortic valve leaflets. Consider echocardiographic evaluation. 7. Small hiatal hernia. 8. Osteopenia and degenerative change with multilevel thoracic spine chronic compression fractures, with old kyphoplasty at T9 and 12. 9. 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 Aortic Atherosclerosis (ICD10-I70.0) and Emphysema (ICD10-J43.9). Electronically Signed   By: Francis Quam M.D.   On: 08/14/2023 01:21   DG Chest 2 View Result Date: 08/13/2023 CLINICAL DATA:  Shortness of breath. EXAM: CHEST - 2 VIEW COMPARISON:  07/04/2013.  Chest CT dated 04/29/2020. FINDINGS: Enlarged cardiac silhouette. Hyperexpanded lungs. Interval nodular density overlying the anterior upper thorax on the lateral view, not seen on the frontal view. No significant change in right greater than left biapical pleural and parenchymal scarring. Multiple thoracic and lumbar compression deformities are again demonstrated with kyphoplasty at 2 levels. Diffuse osteopenia. Mild-to-moderate dextroconvex thoracolumbar scoliosis.  Cholecystectomy clips. Atheromatous aortic calcifications. IMPRESSION: 1. Interval nodular density overlying the anterior upper thorax on the lateral view, not seen on the frontal view. Recommend chest CT for further evaluation. 2. COPD. 3. Cardiomegaly. Electronically Signed   By: Elspeth Bathe M.D.   On: 08/13/2023 19:39     Data Reviewed: Relevant notes from primary care and specialist visits, past discharge summaries as available in EHR, including Care Everywhere. Prior diagnostic testing as pertinent to current admission diagnoses Updated medications and problem lists for reconciliation ED course, including vitals, labs, imaging, treatment and response to treatment Triage notes, nursing  and pharmacy notes and ED provider's notes Notable results as noted in HPI   Assessment and Plan: * COPD with acute bronchitis (HCC) Influenza A Bronchodilators and steroids  Flutter valve Antitussives Was hypoxic with EMS now resolved with nebulized treatments  Influenza A Outside window for benefit of Tamiflu  Pancytopenia (HCC) Suspect suppression related to viral illness Continue to monitor  Lung mass on CT Recommendation for PET scan Consider inpatient pulmonology versus outpatient  Chronic diastolic CHF (congestive heart failure) (HCC) Not on any related meds per med review  Tobacco use disorder Nicotine patch  Postablative hypothyroidism Continue levothyroxine   CAD (coronary artery disease) Continue pravastatin  and aspirin      DVT prophylaxis: Lovenox   Consults: none  Advance Care Planning:   Code Status: Prior   Family Communication: none  Disposition Plan: Back to previous home environment  Severity of Illness: The appropriate patient status for this patient is OBSERVATION. Observation status is judged to be reasonable and necessary in order to provide the required intensity of service to ensure the patient's safety. The patient's presenting symptoms, physical  exam findings, and initial radiographic and laboratory data in the context of their medical condition is felt to place them at decreased risk for further clinical deterioration. Furthermore, it is anticipated that the patient will be medically stable for discharge from the hospital within 2 midnights of admission.   Author: Delayne LULLA Solian, MD 08/14/2023 2:24 AM  For on call review [email or web address].

## 2023-08-14 NOTE — Assessment & Plan Note (Signed)
 Nicotine patch

## 2023-08-14 NOTE — Assessment & Plan Note (Signed)
 Continue levothyroxine

## 2023-08-14 NOTE — Assessment & Plan Note (Signed)
 Outside window for benefit of Tamiflu

## 2023-08-15 DIAGNOSIS — J209 Acute bronchitis, unspecified: Secondary | ICD-10-CM | POA: Diagnosis not present

## 2023-08-15 DIAGNOSIS — J440 Chronic obstructive pulmonary disease with acute lower respiratory infection: Secondary | ICD-10-CM | POA: Diagnosis not present

## 2023-08-15 LAB — BASIC METABOLIC PANEL
Anion gap: 8 (ref 5–15)
BUN: 41 mg/dL — ABNORMAL HIGH (ref 8–23)
CO2: 24 mmol/L (ref 22–32)
Calcium: 9.5 mg/dL (ref 8.9–10.3)
Chloride: 109 mmol/L (ref 98–111)
Creatinine, Ser: 0.86 mg/dL (ref 0.44–1.00)
GFR, Estimated: >60 mL/min (ref >60)
Glucose, Bld: 104 mg/dL — ABNORMAL HIGH (ref 70–99)
Potassium: 3.5 mmol/L (ref 3.5–5.1)
Sodium: 141 mmol/L (ref 135–145)

## 2023-08-15 MED ORDER — ALBUTEROL SULFATE HFA 108 (90 BASE) MCG/ACT IN AERS: 2.0000 | INHALATION_SPRAY | Freq: Four times a day (QID) | RESPIRATORY_TRACT | 2 refills | Status: DC | PRN

## 2023-08-15 MED ORDER — POTASSIUM CHLORIDE CRYS ER 20 MEQ PO TBCR
40.0000 meq | EXTENDED_RELEASE_TABLET | Freq: Once | ORAL | Status: AC
  Administered 2023-08-15: 40 meq via ORAL
  Filled 2023-08-15: qty 2

## 2023-08-15 MED ORDER — PREDNISONE 20 MG PO TABS: 40.0000 mg | ORAL_TABLET | Freq: Every day | ORAL | 0 refills | Status: AC

## 2023-08-15 MED ORDER — GUAIFENESIN ER 600 MG PO TB12: 600.0000 mg | ORAL_TABLET | Freq: Two times a day (BID) | ORAL | 0 refills | Status: DC

## 2023-08-15 NOTE — Care Management Obs Status (Signed)
 MEDICARE OBSERVATION STATUS NOTIFICATION   Patient Details  Name: Robin Graves MRN: [id number] Date of Birth: [birth year] Pt gave verbal consent due to electronic keypad not working properly.  Medicare Observation Status Notification Given:  Yes    Royce Sciara W, CMA 08/15/2023, 10:55 AM

## 2023-08-15 NOTE — Plan of Care (Signed)

## 2023-08-15 NOTE — Progress Notes (Signed)
 Oxygen saturations 90% on room air ambulating.

## 2023-08-15 NOTE — Evaluation (Signed)
 Physical Therapy Evaluation Patient Details Name: Robin Graves MRN: [id number] DOB: 21-Jun-1941 Today's Date: 08/15/2023  History of Present Illness  Robin Graves is a 83 y.o. female with medical history significant for COPD, diastolic CHF, CAD , tobacco use disorder who presents to the ED with persistent shortness of breath and wheezing following a diagnosis of influenza A the day prior at her PCP.  O2 sat 88% with EMS, improving to 95% following treatment with albuterol  and Solu-Medrol    Clinical Impression  Patient received sitting edge of bed she is pleasant and agrees to PT screen. Patient  is independent with all mobility and reports her sister will pick her up and she plans to stay with her sister upon discharge. Patient does not need skilled PT nor any follow up at this time.  Signing off.         If plan is discharge home, recommend the following: Assist for transportation   Can travel by private vehicle    yes    Equipment Recommendations None recommended by PT  Recommendations for Other Services       Functional Status Assessment Patient has not had a recent decline in their functional status     Precautions / Restrictions Precautions Precautions: None Restrictions Weight Bearing Restrictions Per Provider Order: No      Mobility  Bed Mobility Overal bed mobility: Independent             General bed mobility comments: received sitting edge of bed    Transfers Overall transfer level: Independent                      Ambulation/Gait Ambulation/Gait assistance: Independent Gait Distance (Feet): 30 Feet Assistive device: None Gait Pattern/deviations: WFL(Within Functional Limits)          Stairs            Wheelchair Mobility     Tilt Bed    Modified Rankin (Stroke Patients Only)       Balance Overall balance assessment: Independent                                           Pertinent Vitals/Pain Pain  Assessment Pain Assessment: No/denies pain    Home Living Family/patient expects to be discharged to:: Private residence Living Arrangements: Alone Available Help at Discharge: Family;Available 24 hours/day Type of Home: Mobile home Home Access: Level entry       Home Layout: One level Home Equipment: None      Prior Function Prior Level of Function : Independent/Modified Independent;Driving                     Extremity/Trunk Assessment   Upper Extremity Assessment Upper Extremity Assessment: Overall WFL for tasks assessed    Lower Extremity Assessment Lower Extremity Assessment: Overall WFL for tasks assessed    Cervical / Trunk Assessment Cervical / Trunk Assessment: Normal  Communication   Communication Communication: No apparent difficulties Cueing Techniques: Verbal cues  Cognition Arousal: Alert Behavior During Therapy: WFL for tasks assessed/performed Overall Cognitive Status: Within Functional Limits for tasks assessed                                          General Comments  Exercises     Assessment/Plan    PT Assessment Patient does not need any further PT services  PT Problem List         PT Treatment Interventions      PT Goals (Current goals can be found in the Care Plan section)  Acute Rehab PT Goals Patient Stated Goal: to go home today PT Goal Formulation: With patient Time For Goal Achievement: 08/17/23 Potential to Achieve Goals: Good    Frequency       Co-evaluation               AM-PAC PT 6 Clicks Mobility  Outcome Measure Help needed turning from your back to your side while in a flat bed without using bedrails?: None Help needed moving from lying on your back to sitting on the side of a flat bed without using bedrails?: None Help needed moving to and from a bed to a chair (including a wheelchair)?: None Help needed standing up from a chair using your arms (e.g., wheelchair or bedside  chair)?: None Help needed to walk in hospital room?: None Help needed climbing 3-5 steps with a railing? : None 6 Click Score: 24    End of Session   Activity Tolerance: Patient tolerated treatment well Patient left: in bed;Other (comment) (seated on side of bed) Nurse Communication: Mobility status      Time: 0902-0908 PT Time Calculation (min) (ACUTE ONLY): 6 min   Charges:  No charge               Jovany Disano, PT, GCS 08/15/23,9:32 AM

## 2023-08-15 NOTE — Discharge Summary (Signed)
 Physician Discharge Summary   Patient: Robin Graves MRN: [id number] DOB: [birth year]  Admit date:     08/13/2023  Discharge date: 08/15/2023  Discharge Physician: Burnard DELENA Cunning   PCP: Sadie Manna, MD   Recommendations at discharge:   Follow up with Primary Care in 1-2 weeks Follow up with Pulmonology  Repeat CBC, BMP at follow up  Discharge Diagnoses: Principal Problem:   COPD with acute bronchitis (HCC) Active Problems:   Pancytopenia (HCC)   Influenza A   Lung mass on CT   Hypertension   CAD (coronary artery disease)   Postablative hypothyroidism   Tobacco use disorder   Chronic diastolic CHF (congestive heart failure) (HCC)  Resolved Problems:   * No resolved hospital problems. [facility] Course:  Pt admitted with COPD exacerbation triggered by influenza A infection. Treated with IV steroids, nebulized bronchodilators.  Did not require supplemental oxygen for hypoxia and imaging was negative for signs of pneumonia.  Further hospital course and management as outlined below.   2/6 -- pt feels well today, improved.  Ambulatory oxygen sats remained above 90% with ambulation.  Patient is medically stable and requesting discharge home today.   Assessment and Plan:  COPD with acute bronchitis (HCC) Influenza A Treated with nebulized bronchodilators and IV steroids  Flutter valve, antitussives Pt was reported hypoxic with EMS which resolved in the ED prior to admission.   Discharge on prednisone  Recommend outpatient follow up / referral to pulmonology  Influenza A Outside window for benefit of Tamiflu  Pancytopenia (HCC) Suspect suppression related to viral illness Continue to monitor  Lung mass on CT Recommendation for PET scan Consider inpatient pulmonology versus outpatient  Chronic diastolic CHF (congestive heart failure) (HCC) Not on any related meds per med review  Tobacco use disorder Nicotine patch  Postablative hypothyroidism Continue  levothyroxine   CAD (coronary artery disease) Continue pravastatin  and aspirin        Consultants: None Procedures performed: None  Disposition: Home Diet recommendation:  Discharge Diet Orders (From admission, onward)     Start     Ordered   08/15/23 0000  Diet - low sodium heart healthy        08/15/23 1152            DISCHARGE MEDICATION: Allergies as of 08/15/2023       Reactions   Codeine    unknown        Medication List     PAUSE taking these medications    predniSONE  10 MG tablet Wait to take this until: August 19, 2023 Commonly known as: DELTASONE  Take 10 mg by mouth as directed. You also have another medication with the same name that you may need to continue taking.       STOP taking these medications    cefUROXime  250 MG tablet Commonly known as: CEFTIN    doxycycline 100 MG capsule Commonly known as: VIBRAMYCIN   HYDROcodone -acetaminophen  5-325 MG tablet Commonly known as: NORCO/VICODIN       TAKE these medications    albuterol  108 (90 Base) MCG/ACT inhaler Commonly known as: VENTOLIN  HFA Inhale 2 puffs into the lungs every 6 (six) hours as needed for wheezing or shortness of breath.   aspirin  EC 81 MG tablet Take 81 mg by mouth daily. AM   cyanocobalamin 1000 MCG/ML injection Commonly known as: VITAMIN B12 INJECT 1 ML AS DIRECTED MONTHLY   donepezil  5 MG tablet Commonly known as: ARICEPT  Take 5 mg by mouth in the morning.  ferrous sulfate  325 (65 FE) MG tablet Take 1 tablet (325 mg total) by mouth daily with breakfast.   guaiFENesin  600 MG 12 hr tablet Commonly known as: MUCINEX  Take 1 tablet (600 mg total) by mouth 2 (two) times daily for 7 days.   ibuprofen  800 MG tablet Commonly known as: ADVIL  TAKE ONE TABLET BY MOUTH THREE TIMES DAILY AS NEEDED   levothyroxine  75 MCG tablet Commonly known as: SYNTHROID  Take 75 mcg by mouth daily before breakfast.   omeprazole 20 MG capsule Commonly known as:  PRILOSEC Take 20 mg by mouth 2 (two) times daily.   polyethylene glycol 17 g packet Commonly known as: MIRALAX  / GLYCOLAX  Take 17 g by mouth daily as needed for mild constipation or moderate constipation.   potassium chloride  10 MEQ tablet Commonly known as: KLOR-CON  M Take 10 mEq by mouth 2 (two) times daily.   pravastatin  80 MG tablet Commonly known as: PRAVACHOL  Take 1 tablet (80 mg total) by mouth daily.   predniSONE  20 MG tablet Commonly known as: DELTASONE  Take 2 tablets (40 mg total) by mouth daily with breakfast for 3 days. Start taking on: August 16, 2023 What changed: Another medication with the same name was paused. Ask your nurse or doctor if you should take this medication.   SYSTANE OP Apply 1 drop to eye daily.        Discharge Exam: Filed Weights   08/13/23 1916  Weight: 40.8 kg   General exam: awake, alert, no acute distress, frail HEENT: moist mucus membranes, hearing grossly normal  Respiratory system: diminished but improved aeration, no wheezes, rales or rhonchi, normal respiratory effort. On room air Cardiovascular system: normal S1/S2,  RRR, no JVD, murmurs, rubs, gallops, no pedal edema.   Gastrointestinal system: soft, NT, ND, no HSM felt, +bowel sounds. Central nervous system: A&O x 3. no gross focal neurologic deficits, normal speech Extremities: moves all, no edema, normal tone Skin: dry, intact, normal temperature Psychiatry: normal mood, congruent affect, judgement and insight appear normal   Condition at discharge: stable  The results of significant diagnostics from this hospitalization (including imaging, microbiology, ancillary and laboratory) are listed below for reference.   Imaging Studies: CT Angio Chest PE W and/or Wo Contrast Result Date: 08/14/2023 CLINICAL DATA:  Shortness of breath, abdominal pain, positive flu test on 4 days ago. Also, PA and lateral chest yesterday demonstrated suspected mass in the retrosternal clear space  on the lateral view only. EXAM: CT ANGIOGRAPHY CHEST WITH CONTRAST TECHNIQUE: Multidetector CT imaging of the chest was performed using the standard protocol during bolus administration of intravenous contrast. Multiplanar CT image reconstructions and MIPs were obtained to evaluate the vascular anatomy. RADIATION DOSE REDUCTION: This exam was performed according to the departmental dose-optimization program which includes automated exposure control, adjustment of the mA and/or kV according to patient size and/or use of iterative reconstruction technique. CONTRAST:  75mL OMNIPAQUE  IOHEXOL  350 MG/ML SOLN COMPARISON:  PA and lateral chest yesterday, and chest, abdomen and pelvis CT with IV contrast 04/29/2020. FINDINGS: Cardiovascular: The cardiac size is normal. There is no pericardial effusion. Three-vessel coronary artery calcifications greatest in the LAD, additional scattered calcification in the left main coronary artery. There is moderate calcification and thickening of the aortic valve leaflets. Consider echocardiographic evaluation. The aorta is normal in caliber with mild tortuosity and moderate calcific plaques, with atherosclerosis in the great vessels. Pulmonary arteries are normal in caliber without evidence of thromboemboli. The pulmonary veins are slightly distended but no more  than previously. Mediastinum/Nodes: No enlarged mediastinal, hilar, or axillary lymph nodes. Thyroid gland, trachea, and esophagus demonstrate no significant findings. There is a small hiatal hernia. Lungs/Pleura: There is right-greater-than-left biapical pleural-parenchymal scarring, with right apical volume loss and upward hilar retraction and left apical linear calcifications within the scarring. On the right scarring changes merge with coarsely nodular chronic scar-like opacities. There are mild centrilobular emphysematous changes in the lungs. Anteriorly in the base of the right upper lobe there is new demonstration of a  lobular solid nodule with pleural stranding measuring 2.1 x 2 x 1.8 cm (measured on 5:56 and 7:32). This is highly worrisome for a primary bronchogenic neoplasm. PET-CT or tissue sampling recommended. In the left upper lobe a new nodule is also noted on 5:34, with slight cavitation and measuring 8 x 6 mm. This could be a neoplasm or an inflammatory nodule. In the left lower lobe, there is a chronic stable 4 mm nodule on 5:97. There are scattered linear scar-like opacities in the bases. There is no consolidation, effusion or further nodules. Upper Abdomen: No acute abnormality. Status post cholecystectomy with chronically prominent common bile duct. Abdominal aortic atherosclerosis. Musculoskeletal: There are multilevel thoracic spine chronic compression fractures, with old kyphoplasty at T9 and 12. Osteopenia and degenerative change with thoracic kyphosis. No new or progressive thoracic compression fractures. The ribcage is intact. No destructive bone lesion. Bilateral breast implants noted with chronic collapse of the left implant. Review of the MIP images confirms the above findings. IMPRESSION: 1. No evidence of arterial dilatation or thromboembolism. 2. 2.1 x 2 x 1.8 cm lobular solid nodule with pleural stranding in the base of the right upper lobe anteriorly, highly worrisome for a primary bronchogenic neoplasm. PET-CT or tissue sampling recommended. 3. Additional new 8 x 6 mm cavitary nodule in the left upper lobe, could be a neoplasm or an inflammatory nodule. 4. Emphysema. 5. Aortic and coronary artery atherosclerosis. 6. Moderate calcification and thickening of the aortic valve leaflets. Consider echocardiographic evaluation. 7. Small hiatal hernia. 8. Osteopenia and degenerative change with multilevel thoracic spine chronic compression fractures, with old kyphoplasty at T9 and 12. 9. 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 Aortic Atherosclerosis (ICD10-I70.0) and Emphysema (ICD10-J43.9).  Electronically Signed   By: Francis Quam M.D.   On: 08/14/2023 01:21   DG Chest 2 View Result Date: 08/13/2023 CLINICAL DATA:  Shortness of breath. EXAM: CHEST - 2 VIEW COMPARISON:  07/04/2013.  Chest CT dated 04/29/2020. FINDINGS: Enlarged cardiac silhouette. Hyperexpanded lungs. Interval nodular density overlying the anterior upper thorax on the lateral view, not seen on the frontal view. No significant change in right greater than left biapical pleural and parenchymal scarring. Multiple thoracic and lumbar compression deformities are again demonstrated with kyphoplasty at 2 levels. Diffuse osteopenia. Mild-to-moderate dextroconvex thoracolumbar scoliosis. Cholecystectomy clips. Atheromatous aortic calcifications. IMPRESSION: 1. Interval nodular density overlying the anterior upper thorax on the lateral view, not seen on the frontal view. Recommend chest CT for further evaluation. 2. COPD. 3. Cardiomegaly. Electronically Signed   By: Elspeth Bathe M.D.   On: 08/13/2023 19:39    Microbiology: Results for orders placed or performed during the hospital encounter of 02/24/16  C difficile quick scan w PCR reflex     Status: None   Collection Time: 02/23/16  9:00 AM   Specimen: STOOL  Result Value Ref Range Status   C Diff antigen NEGATIVE NEGATIVE Final   C Diff toxin NEGATIVE NEGATIVE Final   C Diff interpretation No C.  difficile detected.  Final  Gastrointestinal Panel by PCR , Stool     Status: None   Collection Time: 02/23/16  9:00 AM   Specimen: Stool  Result Value Ref Range Status   Campylobacter species NOT DETECTED NOT DETECTED Final   Plesimonas shigelloides NOT DETECTED NOT DETECTED Final   Salmonella species NOT DETECTED NOT DETECTED Final   Yersinia enterocolitica NOT DETECTED NOT DETECTED Final   Vibrio species NOT DETECTED NOT DETECTED Final   Vibrio cholerae NOT DETECTED NOT DETECTED Final   Enteroaggregative E coli (EAEC) NOT DETECTED NOT DETECTED Final   Enteropathogenic E  coli (EPEC) NOT DETECTED NOT DETECTED Final   Enterotoxigenic E coli (ETEC) NOT DETECTED NOT DETECTED Final   Shiga like toxin producing E coli (STEC) NOT DETECTED NOT DETECTED Final   E. coli O157 NOT DETECTED NOT DETECTED Final   Shigella/Enteroinvasive E coli (EIEC) NOT DETECTED NOT DETECTED Final   Cryptosporidium NOT DETECTED NOT DETECTED Final   Cyclospora cayetanensis NOT DETECTED NOT DETECTED Final   Entamoeba histolytica NOT DETECTED NOT DETECTED Final   Giardia lamblia NOT DETECTED NOT DETECTED Final   Adenovirus F40/41 NOT DETECTED NOT DETECTED Final   Astrovirus NOT DETECTED NOT DETECTED Final   Norovirus GI/GII NOT DETECTED NOT DETECTED Final   Rotavirus A NOT DETECTED NOT DETECTED Final   Sapovirus (I, II, IV, and V) NOT DETECTED NOT DETECTED Final    Labs: CBC: Recent Labs  Lab 08/13/23 1918  WBC 3.0*  HGB 11.7*  HCT 35.8*  MCV 97.0  PLT 132*   Basic Metabolic Panel: Recent Labs  Lab 08/13/23 1918 08/15/23 0852  NA 136 141  K 3.0* 3.5  CL 103 109  CO2 19* 24  GLUCOSE 186* 104*  BUN 22 41*  CREATININE 0.86 0.86  CALCIUM 9.1 9.5   Liver Function Tests: No results for input(s): AST, ALT, ALKPHOS, BILITOT, PROT, ALBUMIN in the last 168 hours. CBG: No results for input(s): GLUCAP in the last 168 hours.  Discharge time spent: less than 30 minutes.  Signed: Burnard DELENA Cunning, DO [facility] 08/15/2023

## 2023-08-22 ENCOUNTER — Other Ambulatory Visit: Payer: Self-pay | Admitting: Specialist

## 2023-08-22 ENCOUNTER — Observation Stay
Admission: EM | Admit: 2023-08-22 | Discharge: 2023-08-23 | Disposition: A | Discharge status: Home / Self Care | Payer: Medicare PPO | Attending: Hospitalist | Admitting: Hospitalist

## 2023-08-22 ENCOUNTER — Emergency Department: Payer: Medicare PPO

## 2023-08-22 DIAGNOSIS — Z681 Body mass index (BMI) 19 or less, adult: Secondary | ICD-10-CM | POA: Diagnosis not present

## 2023-08-22 DIAGNOSIS — R918 Other nonspecific abnormal finding of lung field: Secondary | ICD-10-CM | POA: Diagnosis not present

## 2023-08-22 DIAGNOSIS — E785 Hyperlipidemia, unspecified: Secondary | ICD-10-CM | POA: Insufficient documentation

## 2023-08-22 DIAGNOSIS — E43 Unspecified severe protein-calorie malnutrition: Secondary | ICD-10-CM | POA: Diagnosis not present

## 2023-08-22 DIAGNOSIS — E039 Hypothyroidism, unspecified: Secondary | ICD-10-CM | POA: Insufficient documentation

## 2023-08-22 DIAGNOSIS — Z79899 Other long term (current) drug therapy: Secondary | ICD-10-CM | POA: Insufficient documentation

## 2023-08-22 DIAGNOSIS — F0390 Unspecified dementia without behavioral disturbance: Secondary | ICD-10-CM | POA: Diagnosis not present

## 2023-08-22 DIAGNOSIS — J101 Influenza due to other identified influenza virus with other respiratory manifestations: Secondary | ICD-10-CM | POA: Diagnosis present

## 2023-08-22 DIAGNOSIS — I5032 Chronic diastolic (congestive) heart failure: Secondary | ICD-10-CM | POA: Diagnosis not present

## 2023-08-22 DIAGNOSIS — I2510 Atherosclerotic heart disease of native coronary artery without angina pectoris: Secondary | ICD-10-CM | POA: Diagnosis not present

## 2023-08-22 DIAGNOSIS — K209 Esophagitis, unspecified without bleeding: Secondary | ICD-10-CM | POA: Diagnosis not present

## 2023-08-22 DIAGNOSIS — J449 Chronic obstructive pulmonary disease, unspecified: Secondary | ICD-10-CM | POA: Diagnosis present

## 2023-08-22 DIAGNOSIS — J1089 Influenza due to other identified influenza virus with other manifestations: Secondary | ICD-10-CM | POA: Insufficient documentation

## 2023-08-22 DIAGNOSIS — Z87891 Personal history of nicotine dependence: Secondary | ICD-10-CM | POA: Insufficient documentation

## 2023-08-22 DIAGNOSIS — R197 Diarrhea, unspecified: Secondary | ICD-10-CM | POA: Insufficient documentation

## 2023-08-22 DIAGNOSIS — D72829 Elevated white blood cell count, unspecified: Secondary | ICD-10-CM | POA: Diagnosis not present

## 2023-08-22 DIAGNOSIS — Z7982 Long term (current) use of aspirin: Secondary | ICD-10-CM | POA: Insufficient documentation

## 2023-08-22 DIAGNOSIS — E890 Postprocedural hypothyroidism: Secondary | ICD-10-CM | POA: Diagnosis present

## 2023-08-22 DIAGNOSIS — I10 Essential (primary) hypertension: Secondary | ICD-10-CM | POA: Diagnosis present

## 2023-08-22 DIAGNOSIS — Z8673 Personal history of transient ischemic attack (TIA), and cerebral infarction without residual deficits: Secondary | ICD-10-CM | POA: Insufficient documentation

## 2023-08-22 DIAGNOSIS — R636 Underweight: Secondary | ICD-10-CM | POA: Diagnosis not present

## 2023-08-22 DIAGNOSIS — F03918 Unspecified dementia, unspecified severity, with other behavioral disturbance: Secondary | ICD-10-CM | POA: Diagnosis present

## 2023-08-22 DIAGNOSIS — R070 Pain in throat: Secondary | ICD-10-CM | POA: Diagnosis present

## 2023-08-22 LAB — BASIC METABOLIC PANEL
Anion gap: 13 (ref 5–15)
BUN: 36 mg/dL — ABNORMAL HIGH (ref 8–23)
CO2: 20 mmol/L — ABNORMAL LOW (ref 22–32)
Calcium: 9.6 mg/dL (ref 8.9–10.3)
Chloride: 111 mmol/L (ref 98–111)
Creatinine, Ser: 1.05 mg/dL — ABNORMAL HIGH (ref 0.44–1.00)
GFR, Estimated: 53 mL/min — ABNORMAL LOW (ref >60)
Glucose, Bld: 142 mg/dL — ABNORMAL HIGH (ref 70–99)
Potassium: 3.7 mmol/L (ref 3.5–5.1)
Sodium: 144 mmol/L (ref 135–145)

## 2023-08-22 LAB — CBC
HCT: 36.2 % (ref 36.0–46.0)
Hemoglobin: 11.9 g/dL — ABNORMAL LOW (ref 12.0–15.0)
MCH: 31.1 pg (ref 26.0–34.0)
MCHC: 32.9 g/dL (ref 30.0–36.0)
MCV: 94.5 fL (ref 80.0–100.0)
Platelets: 196 10*3/uL (ref 150–400)
RBC: 3.83 MIL/uL — ABNORMAL LOW (ref 3.87–5.11)
RDW: 12.9 % (ref 11.5–15.5)
WBC: 23.5 10*3/uL — ABNORMAL HIGH (ref 4.0–10.5)
nRBC: 0 % (ref 0.0–0.2)

## 2023-08-22 LAB — TROPONIN I (HIGH SENSITIVITY)
Troponin I (High Sensitivity): 13 ng/L (ref <18)
Troponin I (High Sensitivity): 16 ng/L (ref <18)

## 2023-08-22 LAB — LACTIC ACID, PLASMA
Lactic Acid, Venous: 2.1 mmol/L (ref 0.5–1.9)
Lactic Acid, Venous: 1.4 mmol/L (ref 0.5–1.9)

## 2023-08-22 LAB — PROCALCITONIN: Procalcitonin: 0.28 ng/mL

## 2023-08-22 MED ORDER — ALUM & MAG HYDROXIDE-SIMETH 200-200-20 MG/5ML PO SUSP
30.0000 mL | Freq: Once | ORAL | Status: AC
  Administered 2023-08-22: 30 mL via ORAL
  Filled 2023-08-22: qty 30

## 2023-08-22 MED ORDER — ACETAMINOPHEN 325 MG PO TABS: 650.0000 mg | ORAL_TABLET | Freq: Four times a day (QID) | ORAL | Status: DC | PRN

## 2023-08-22 MED ORDER — ENOXAPARIN SODIUM 30 MG/0.3ML IJ SOSY
30.0000 mg | PREFILLED_SYRINGE | INTRAMUSCULAR | Status: DC
  Administered 2023-08-22: 30 mg via SUBCUTANEOUS
  Filled 2023-08-22: qty 0.3

## 2023-08-22 MED ORDER — SODIUM CHLORIDE 0.9 % IV SOLN
2.0000 g | Freq: Once | INTRAVENOUS | Status: AC
  Administered 2023-08-22: 2 g via INTRAVENOUS
  Filled 2023-08-22: qty 12.5

## 2023-08-22 MED ORDER — VANCOMYCIN HCL IN DEXTROSE 1-5 GM/200ML-% IV SOLN
1000.0000 mg | Freq: Once | INTRAVENOUS | Status: AC
  Administered 2023-08-22: 1000 mg via INTRAVENOUS
  Filled 2023-08-22: qty 200

## 2023-08-22 MED ORDER — PANTOPRAZOLE SODIUM 40 MG IV SOLR
40.0000 mg | Freq: Two times a day (BID) | INTRAVENOUS | Status: DC
  Administered 2023-08-22 – 2023-08-23 (×2): 40 mg via INTRAVENOUS
  Filled 2023-08-22 (×2): qty 10

## 2023-08-22 MED ORDER — FENTANYL CITRATE PF 50 MCG/ML IJ SOSY: 12.5000 ug | PREFILLED_SYRINGE | INTRAMUSCULAR | Status: DC | PRN

## 2023-08-22 MED ORDER — ENSURE ENLIVE PO LIQD
237.0000 mL | Freq: Two times a day (BID) | ORAL | Status: DC
  Administered 2023-08-23 (×2): 237 mL via ORAL

## 2023-08-22 MED ORDER — SODIUM CHLORIDE 0.9 % IV BOLUS
500.0000 mL | Freq: Once | INTRAVENOUS | Status: AC
  Administered 2023-08-22: 500 mL via INTRAVENOUS

## 2023-08-22 MED ORDER — OXYCODONE HCL 5 MG PO TABS: 5.0000 mg | ORAL_TABLET | Freq: Four times a day (QID) | ORAL | Status: DC | PRN

## 2023-08-22 MED ORDER — DM-GUAIFENESIN ER 30-600 MG PO TB12: 1.0000 | ORAL_TABLET | Freq: Two times a day (BID) | ORAL | Status: DC | PRN

## 2023-08-22 MED ORDER — ONDANSETRON HCL 4 MG/2ML IJ SOLN: 4.0000 mg | Freq: Three times a day (TID) | INTRAMUSCULAR | Status: DC | PRN

## 2023-08-22 MED ORDER — ALBUTEROL SULFATE (2.5 MG/3ML) 0.083% IN NEBU: 3.0000 mL | INHALATION_SOLUTION | RESPIRATORY_TRACT | Status: DC | PRN

## 2023-08-22 MED ORDER — PHENOL 1.4 % MT LIQD: 1.0000 | OROMUCOSAL | Status: DC | PRN

## 2023-08-22 MED ORDER — LIDOCAINE VISCOUS HCL 2 % MT SOLN
15.0000 mL | Freq: Once | OROMUCOSAL | Status: AC
  Administered 2023-08-22: 15 mL via ORAL
  Filled 2023-08-22: qty 15

## 2023-08-22 NOTE — ED Triage Notes (Signed)
Pt c/o increasing pain in mouth, esophagus, and stomach x1 week.  Pain score 5/10.  Pt reports pain mainly when she tries to swallow while eating and drinking.  Pt has a PET scan scheduled tomorrow at noon r/t new lung nodules.

## 2023-08-22 NOTE — ED Provider Notes (Signed)
[facility] Provider Note    Event Date/Time   First MD Initiated Contact with Patient 08/22/23 1512     (approximate)   History   Oral pain, Chest Pain, Dysphagia, and Abdominal Pain   HPI  Robin Graves is a 83 y.o. female who presents to the emergency department today because of concerns for throat chest and abdominal pain.  Symptoms started about 24 hours ago.  It has been accompanied by nausea although she denies any vomiting.  The pain has made it hard for her to eat or drink anything.  Patient had a recent hospitalization was discharged about a week ago for COPD.  Was placed on steroids during that admission took steroids afterwards.  Patient has not had any fevers.  Has not noticed any change in her stool.     Physical Exam   Triage Vital Signs: ED Triage Vitals  Encounter Vitals Group     BP 08/22/23 1313 100/63     Systolic BP Percentile --      Diastolic BP Percentile --      Pulse Rate 08/22/23 1313 92     Resp 08/22/23 1313 18     Temp 08/22/23 1313 98.2 F (36.8 C)     Temp Source 08/22/23 1313 Oral     SpO2 08/22/23 1313 92 %     Weight 08/22/23 1314 90 lb (40.8 kg)     Height 08/22/23 1314 5' (1.524 m)     Head Circumference --      Peak Flow --      Pain Score 08/22/23 1314 4     Pain Loc --      Pain Education --      Exclude from Growth Chart --     Most recent vital signs: Vitals:   08/22/23 1313 08/22/23 1439  BP: 100/63 114/66  Pulse: 92 95  Resp: 18 (!) 22  Temp: 98.2 F (36.8 C)   SpO2: 92% 97%   General: Awake, alert, oriented. CV:  Good peripheral perfusion. Regular rate and rhythm. Resp:  Normal effort. Lungs clear. Abd:  No distention. Non tender.   ED Results / Procedures / Treatments   Labs (all labs ordered are listed, but only abnormal results are displayed) Labs Reviewed  BASIC METABOLIC PANEL - Abnormal; Notable for the following components:      Result Value   CO2 20 (*)    Glucose, Bld 142  (*)    BUN 36 (*)    Creatinine, Ser 1.05 (*)    GFR, Estimated 53 (*)    All other components within normal limits  CBC - Abnormal; Notable for the following components:   WBC 23.5 (*)    RBC 3.83 (*)    Hemoglobin 11.9 (*)    All other components within normal limits  TROPONIN I (HIGH SENSITIVITY)  TROPONIN I (HIGH SENSITIVITY)     EKG  I, Phineas Semen, attending physician, personally viewed and interpreted this EKG  EKG Time: 1320 Rate: 104 Rhythm: sinus tachycardia Axis: normal Intervals: qtc 460 QRS: narrow, LVH ST changes: no st elevation Impression: abnormal ekg   RADIOLOGY I independently interpreted and visualized the CXR. My interpretation: No pneumonia Radiology interpretation:  IMPRESSION:  1. Stable right upper lobe perihilar mass concerning for malignancy  based on previous CT findings. Subcentimeter left upper lobe nodule  seen on prior CT is not visualized by x-ray. PET scan again  recommended if not performed in the  interim.  2. No acute airspace disease.      PROCEDURES:  Critical Care performed: No   MEDICATIONS ORDERED IN ED: Medications - No data to display   IMPRESSION / MDM / ASSESSMENT AND PLAN / ED COURSE  I reviewed the triage vital signs and the nursing notes.                              Differential diagnosis includes, but is not limited to, esophagitis, GERD, infection, electrolyte abnormality  Patient's presentation is most consistent with acute presentation with potential threat to life or bodily function.   The patient is on the cardiac monitor to evaluate for evidence of arrhythmia and/or significant heart rate changes.  Patient presented to the emergency department today because of concerns for throat chest and abdominal pain and decreased oral intake.  On exam patient is awake and alert.  Abdomen is benign.  Blood work was notable for significant leukocytosis.  Unclear at this time if it is due to infection or  recent steroid use.  Will add on procalcitonin and lactic.  Will try viscous lidocaine and Maalox if the pain is secondary to esophagitis.  Will give IV fluids.  Patient did feel better after GI cocktail, do think the discomfort is likely coming from esophagitis.  However the patient's blood work was concerning with elevated lactic and slightly elevated procalcitonin.  Because of this broad-spectrum antibiotics were initiated.  Discussed with Dr. Clyde Lundborg with the hospitalist service who will evaluate for admission.      FINAL CLINICAL IMPRESSION(S) / ED DIAGNOSES   Final diagnoses:  Esophagitis     Note:  This document was prepared using Dragon voice recognition software and may include unintentional dictation errors.    Phineas Semen, MD 08/22/23 2207

## 2023-08-22 NOTE — ED Notes (Signed)
Critical Result: Lactic Acid 2.1  Derrill Kay, MD aware

## 2023-08-22 NOTE — Progress Notes (Signed)
Anticoagulation monitoring(Lovenox):  82yo  F ordered Lovenox 40 mg Q24h    Filed Weights   08/22/23 1314  Weight: 40.8 kg (90 lb)   BMI 17   Lab Results  Component Value Date   CREATININE 1.05 (H) 08/22/2023   CREATININE 0.86 08/15/2023   CREATININE 0.86 08/13/2023   Estimated Creatinine Clearance: 26.1 mL/min (A) (by C-G formula based on SCr of 1.05 mg/dL (H)). Hemoglobin & Hematocrit     Component Value Date/Time   HGB 11.9 (L) 08/22/2023 1329   HGB 11.5 06/30/2015 1145   HCT 36.2 08/22/2023 1329   HCT 35.7 06/30/2015 1145     Per Protocol for Patient with estCrcl < 30 ml/min and BMI < 30, will transition to Lovenox 30 mg Q24h       Bari Mantis PharmD Clinical Pharmacist 08/22/2023

## 2023-08-22 NOTE — ED Notes (Signed)
Patient given warm blankets and water as requested.

## 2023-08-22 NOTE — H&P (Signed)
History and Physical    Robin Graves Robin Graves DOB: 1940/09/16 DOA: 08/22/2023  Referring MD/NP/PA:   PCP: Barbette Reichmann, MD   Patient coming from:  The patient is coming from home.     Chief Complaint: Burning pain in esophagus  HPI: Robin Graves is a 83 y.o. female with medical history significant of COPD, HLD, CAD, diastolic CHF, stroke, hypothyroidism, dementia, GERD, anemia, hypothyroidism (s/p of thyroidectomy due to Graves' disease), migraine headache, lung mass, who presents with burning pain in esophagus.  Patient was recently hospitalized from 1 2/4 - 2/6 due to COPD exacerbation secondary to Flu A.  Patient was treated with Tamiflu and discharged on prednisone.  Her shortness of breath has improved, but developed the pain in esophagus, described as burning pain from oral along esophageal pathway in her chest.  The pain is constant, sharp, scratching, moderate, nonradiating.  She has poor appetite and decreased oral intake because of the pain.  The pain has made it hard for her to eat or drink anything. Patient had some chest discomfort earlier, which has resolved.  Currently denies cough, chest pain, SOB.  No fever or chills.  She reports one episodes of diarrhea today.  No nausea, vomiting or abdominal pain.  No symptoms of UTI.  Data reviewed independently and ED Course: pt was found to have troponin 13 --> 16, WBC 23.5, lactic acid 2.1- > 1.4, procalcitonin 0.28, creatinine 1.05, BUN 36, temperature normal, blood pressure 151/65, heart rate 95, 84, RR 27, oxygen saturation 86% on room air.  Chest x-ray showed right upper lobe lung mass which is known from previous admission.  Patient is placed on telemetry bed for observation.  EKG: I have personally reviewed.  Sinus rhythm, QTc 460, LVH, T wave inversion in V5-V6, early R wave progression.   Review of Systems:   General: no fevers, chills, no body weight gain, has poor appetite, has fatigue HEENT: no blurry vision,  hearing changes or sore throat Respiratory: no dyspnea, coughing, wheezing CV: no chest pain, no palpitations GI: no nausea, vomiting, abdominal pain, diarrhea, constipation GU: no dysuria, burning on urination, increased urinary frequency, hematuria  Ext: no leg edema Neuro: no unilateral weakness, numbness, or tingling, no vision change or hearing loss Skin: no rash, no skin tear. MSK: No muscle spasm, no deformity, no limitation of range of movement in spin Heme: No easy bruising.  Travel history: No recent long distant travel.   Allergy:  Allergies  Allergen Reactions   Codeine     unknown    Past Medical History:  Diagnosis Date   Anemia    Arthritis    BACK   Chicken pox    Colon polyps    COPD (chronic obstructive pulmonary disease) (HCC)    Diverticulosis    Emphysema of lung (HCC)    GERD (gastroesophageal reflux disease)    Graves disease    Graves' disease with exophthalmos    REMOVED ON THYROID MEDS   Headache    MIGRAINES, 1 EVERY DAY OR LESS OFTEN   HH (hiatus hernia)    Hyperlipidemia    Pernicious anemia    Stroke (HCC)    MINI STROKES 1995 A FEW BEFORE 1995   Wears dentures    UPPER AND LOWER    Past Surgical History:  Procedure Laterality Date   ABDOMINAL HYSTERECTOMY  1974   AUGMENTATION MAMMAPLASTY Bilateral 1976   silicone   AUGMENTATION MAMMAPLASTY Bilateral 1996   saline   BACK SURGERY  [facility],1,1914   HURT AT WORK   BREAST SURGERY Bilateral , 1973   history of leakage   CATARACT EXTRACTION W/PHACO Right 04/20/2015   Procedure: CATARACT EXTRACTION PHACO AND INTRAOCULAR LENS PLACEMENT (IOC);  Surgeon: Lockie Mola, MD;  Location: [facility];  Service: Ophthalmology;  Laterality: Right;   CATARACT EXTRACTION W/PHACO Left 01/23/2017   Procedure: CATARACT EXTRACTION PHACO AND INTRAOCULAR LENS PLACEMENT (IOC)  Left;  Surgeon: Lockie Mola, MD;  Location: [facility];  Service: Ophthalmology;  Laterality: Left;    CHOLECYSTECTOMY     COLONOSCOPY WITH PROPOFOL N/A 05/16/2015   Procedure: COLONOSCOPY WITH PROPOFOL;  Surgeon: Elnita Maxwell, MD;  Location: [facility];  Service: Endoscopy;  Laterality: N/A;   ESOPHAGOGASTRODUODENOSCOPY (EGD) WITH PROPOFOL N/A 05/16/2015   Procedure: ESOPHAGOGASTRODUODENOSCOPY (EGD) WITH PROPOFOL;  Surgeon: Elnita Maxwell, MD;  Location: [facility];  Service: Endoscopy;  Laterality: N/A;   EYE SURGERY     KYPHOPLASTY  12/10/2013   T12   KYPHOPLASTY N/A 11/08/2016   Procedure: KYPHOPLASTY- T9;  Surgeon: Kennedy Bucker, MD;  Location: [facility];  Service: Orthopedics;  Laterality: N/A;   MASTECTOMY Bilateral 1970's   subcutaneous mastectomy done for preventative reasons with reconstruction   thyroid ablation      Social History:  reports that she quit smoking about 15 years ago. Her smoking use included cigarettes. She started smoking about 19 years ago. She has a 1 pack-year smoking history. She has never used smokeless tobacco. She reports that she does not drink alcohol and does not use drugs.  Family History:  Family History  Problem Relation Age of Onset   Coronary artery disease Mother    Stroke Father    Heart attack Father    Cancer Brother        colon   Stroke Brother    Heart attack Brother    Diabetes Brother      Prior to Admission medications   Medication Sig Start Date End Date Taking? Authorizing Provider  albuterol (VENTOLIN HFA) 108 (90 Base) MCG/ACT inhaler Inhale 2 puffs into the lungs every 6 (six) hours as needed for wheezing or shortness of breath. 08/15/23   Pennie Banter, DO  aspirin EC 81 MG tablet Take 81 mg by mouth daily. AM    [provider]  cyanocobalamin (,VITAMIN B-12,) 1000 MCG/ML injection INJECT 1 ML AS DIRECTED MONTHLY 10/03/15   Steele Sizer, MD  donepezil (ARICEPT) 5 MG tablet Take 5 mg by mouth in the morning. 07/16/23 07/15/24  [provider]  ferrous sulfate 325 (65 FE) MG tablet Take 1  tablet (325 mg total) by mouth daily with breakfast. 06/30/15   Steele Sizer, MD  guaiFENesin (MUCINEX) 600 MG 12 hr tablet Take 1 tablet (600 mg total) by mouth 2 (two) times daily for 7 days. 08/15/23 08/22/23  Esaw Grandchild A, DO  ibuprofen (ADVIL,MOTRIN) 800 MG tablet TAKE ONE TABLET BY MOUTH THREE TIMES DAILY AS NEEDED 08/27/15   Steele Sizer, MD  levothyroxine (SYNTHROID, LEVOTHROID) 75 MCG tablet Take 75 mcg by mouth daily before breakfast. 10/08/16   [provider]  omeprazole (PRILOSEC) 20 MG capsule Take 20 mg by mouth 2 (two) times daily.     [provider]  Polyethyl Glycol-Propyl Glycol (SYSTANE OP) Apply 1 drop to eye daily.    [provider]  polyethylene glycol (MIRALAX / GLYCOLAX) packet Take 17 g by mouth daily as needed for mild constipation or moderate constipation.  [provider]  potassium chloride (K-DUR,KLOR-CON) 10 MEQ tablet Take 10 mEq by mouth 2 (two) times daily.    [provider]  pravastatin (PRAVACHOL) 80 MG tablet Take 1 tablet (80 mg total) by mouth daily. 06/30/15   Steele Sizer, MD  predniSONE (DELTASONE) 10 MG tablet Take 10 mg by mouth as directed. 08/12/23   [provider]    Physical Exam: Vitals:   08/22/23 2230 08/22/23 2300 08/22/23 2330 08/23/23 0000  BP: 131/61 (!) 143/66 (!) 143/66 138/63  Pulse: 71 84 65 80  Resp: 18 (!) 23 16 (!) 31  Temp:      TempSrc:      SpO2: 97% 94% 96% 95%  Weight:      Height:       General: Not in acute distress.  Dry mucous membrane. HEENT:       Eyes: PERRL, EOMI, no jaundice       ENT: No discharge from the ears and nose, no pharynx injection, no tonsillar enlargement.        Neck: No JVD, no bruit, no mass felt. Heme: No neck lymph node enlargement. Cardiac: S1/S2, RRR, No murmurs, No gallops or rubs. Respiratory: No rales, wheezing, rhonchi or rubs. GI: Soft, nondistended, nontender, no rebound pain, no organomegaly, BS present. GU: No  hematuria Ext: No pitting leg edema bilaterally. 1+DP/PT pulse bilaterally. Musculoskeletal: No joint deformities, No joint redness or warmth, no limitation of ROM in spin. Skin: No rashes.  Neuro: Alert, oriented X3, cranial nerves II-XII grossly intact, moves all extremities normally.  Psych: Patient is not psychotic, no suicidal or hemocidal ideation.  Labs on Admission: I have personally reviewed following labs and imaging studies  CBC: Recent Labs  Lab 08/22/23 1329  WBC 23.5*  HGB 11.9*  HCT 36.2  MCV 94.5  PLT 196   Basic Metabolic Panel: Recent Labs  Lab 08/22/23 1329  NA 144  K 3.7  CL 111  CO2 20*  GLUCOSE 142*  BUN 36*  CREATININE 1.05*  CALCIUM 9.6   GFR: Estimated Creatinine Clearance: 26.1 mL/min (A) (by C-G formula based on SCr of 1.05 mg/dL (H)). Liver Function Tests: No results for input(s): "AST", "ALT", "ALKPHOS", "BILITOT", "PROT", "ALBUMIN" in the last 168 hours. No results for input(s): "LIPASE", "AMYLASE" in the last 168 hours. No results for input(s): "AMMONIA" in the last 168 hours. Coagulation Profile: No results for input(s): "INR", "PROTIME" in the last 168 hours. Cardiac Enzymes: No results for input(s): "CKTOTAL", "CKMB", "CKMBINDEX", "TROPONINI" in the last 168 hours. BNP (last 3 results) No results for input(s): "PROBNP" in the last 8760 hours. HbA1C: No results for input(s): "HGBA1C" in the last 72 hours. CBG: No results for input(s): "GLUCAP" in the last 168 hours. Lipid Profile: No results for input(s): "CHOL", "HDL", "LDLCALC", "TRIG", "CHOLHDL", "LDLDIRECT" in the last 72 hours. Thyroid Function Tests: No results for input(s): "TSH", "T4TOTAL", "FREET4", "T3FREE", "THYROIDAB" in the last 72 hours. Anemia Panel: No results for input(s): "VITAMINB12", "FOLATE", "FERRITIN", "TIBC", "IRON", "RETICCTPCT" in the last 72 hours. Urine analysis:    Component Value Date/Time   COLORURINE YELLOW (A) 10/15/2016 1613   APPEARANCEUR  HAZY (A) 10/15/2016 1613   APPEARANCEUR Clear 12/11/2012 0347   LABSPEC 1.026 10/15/2016 1613   LABSPEC 1.011 12/11/2012 0347   PHURINE 5.0 10/15/2016 1613   GLUCOSEU NEGATIVE 10/15/2016 1613   GLUCOSEU Negative 12/11/2012 0347   HGBUR NEGATIVE 10/15/2016 1613   BILIRUBINUR NEGATIVE 10/15/2016 1613   BILIRUBINUR Negative  12/11/2012 0347   KETONESUR NEGATIVE 10/15/2016 1613   PROTEINUR 100 (A) 10/15/2016 1613   NITRITE NEGATIVE 10/15/2016 1613   LEUKOCYTESUR MODERATE (A) 10/15/2016 1613   LEUKOCYTESUR 2+ 12/11/2012 0347   Sepsis Labs: @LABRCNTIP (procalcitonin:4,lacticidven:4) )No results found for this or any previous visit (from the past 240 hours).   Radiological Exams on Admission:   Assessment/Plan Principal Problem:   Esophagitis Active Problems:   COPD (chronic obstructive pulmonary disease) (HCC)   Influenza A   Leukocytosis   Chronic diastolic CHF (congestive heart failure) (HCC)   Lung mass on CT   Hypertension   CAD (coronary artery disease)   Hyperlipidemia   Postablative hypothyroidism   Dementia with behavioral disturbance (HCC)   Diarrhea   Protein-calorie malnutrition, severe (HCC)   Assessment and Plan:  Possible esophagitis: Patient symptoms seem to be due to esophagitis.  Patient has dry mucous membranes with dehydration due to poor oral intake secondary to pain. -Placed on telemetry bed for observation -Protonix 40 mg twice daily by IV -As needed Tylenol, fentanyl and oxycodone for pain -IV fluid: Normal saline 500 ml -hold ibuprofen  COPD (chronic obstructive pulmonary disease) (HCC): Stable -Bronchodilators and as needed Mucinex  Recent Influenza A infection: Symptoms have dramatically improved.  Denies cough, SOB, fever or chills. -Observe closely  Leukocytosis: WBC 23.5, no fever, does not seem to infection.  Likely due to recent steroid use.  Patient received 1 dose of vancomycin and cefepime in ED, will not continue antibiotics  now. -Follow-up by CBC  Chronic diastolic CHF (congestive heart failure) (HCC): 2D echo on 03/22/2020 showed EF> 55%.  Patient does not have leg edema or JVD.  CHF is compensated. -Check BNP  Lung mass on CT: pt was found to have a lung mass in previous admission.  Patient is following up with pulmonologist, Dr. Meredeth Ide.  He has PET scan scheduled tomorrow -Follow-up with pulmonologist  Hypertension: Patient is not taking medications.  Blood pressure 151/65 -Hydralazine as needed  CAD (coronary artery disease) -Pravastatin, aspirin  Hyperlipidemia -Pravastatin  Postablative hypothyroidism -Synthroid  Diarrhea: Patient reported 1 episode of diarrhea today, no nausea, vomiting, abdominal pain.  She has leukocytosis which is likely due to recent steroid use.  Low suspicions for C. Difficile.  Patient is taking laxative at home. -Imodium as needed  Dementia with behavioral disturbance (HCC) -Donepezil  Protein calorie malnutrition, severe: Body weight 40.8 kg, BMI 17.58 -Ensure -Nutrition consult     DVT ppx: SQ Lovenox  Code Status: Full code     Family Communication:     not done, no family member is at bed side.       Disposition Plan:  Anticipate discharge back to previous environment  Consults called:  none  Admission status and Level of care: Telemetry Medical:    for obs as inpt          Dispo: The patient is from: Home              Anticipated d/c is to: Home              Anticipated d/c date is: 1 day              Patient currently is not medically stable to d/c.    Severity of Illness:  The appropriate patient status for this patient is OBSERVATION. Observation status is judged to be reasonable and necessary in order to provide the required intensity of service to ensure the patient's safety. The patient's presenting  symptoms, physical exam findings, and initial radiographic and laboratory data in the context of their medical condition is felt to place  them at decreased risk for further clinical deterioration. Furthermore, it is anticipated that the patient will be medically stable for discharge from the hospital within 2 midnights of admission.        Date of Service 08/23/2023    Lorretta Harp [facility]   If 7PM-7AM, please contact night-coverage [email or web address] 08/23/2023, 12:29 AM

## 2023-08-23 ENCOUNTER — Ambulatory Visit: Admission: RE | Admit: 2023-08-23 | Payer: Medicare PPO | Source: Ambulatory Visit

## 2023-08-23 ENCOUNTER — Other Ambulatory Visit: Payer: Self-pay

## 2023-08-23 DIAGNOSIS — A419 Sepsis, unspecified organism: Secondary | ICD-10-CM | POA: Diagnosis not present

## 2023-08-23 DIAGNOSIS — R197 Diarrhea, unspecified: Secondary | ICD-10-CM | POA: Diagnosis present

## 2023-08-23 DIAGNOSIS — R570 Cardiogenic shock: Secondary | ICD-10-CM | POA: Diagnosis not present

## 2023-08-23 DIAGNOSIS — K209 Esophagitis, unspecified without bleeding: Secondary | ICD-10-CM | POA: Diagnosis not present

## 2023-08-23 LAB — CBC
HCT: 32.9 % — ABNORMAL LOW (ref 36.0–46.0)
Hemoglobin: 10.8 g/dL — ABNORMAL LOW (ref 12.0–15.0)
MCH: 30.9 pg (ref 26.0–34.0)
MCHC: 32.8 g/dL (ref 30.0–36.0)
MCV: 94.3 fL (ref 80.0–100.0)
Platelets: 159 10*3/uL (ref 150–400)
RBC: 3.49 MIL/uL — ABNORMAL LOW (ref 3.87–5.11)
RDW: 12.9 % (ref 11.5–15.5)
WBC: 20.9 10*3/uL — ABNORMAL HIGH (ref 4.0–10.5)
nRBC: 0 % (ref 0.0–0.2)

## 2023-08-23 LAB — BASIC METABOLIC PANEL
Anion gap: 7 (ref 5–15)
BUN: 33 mg/dL — ABNORMAL HIGH (ref 8–23)
CO2: 23 mmol/L (ref 22–32)
Calcium: 9 mg/dL (ref 8.9–10.3)
Chloride: 114 mmol/L — ABNORMAL HIGH (ref 98–111)
Creatinine, Ser: 0.86 mg/dL (ref 0.44–1.00)
GFR, Estimated: >60 mL/min (ref >60)
Glucose, Bld: 88 mg/dL (ref 70–99)
Potassium: 3 mmol/L — ABNORMAL LOW (ref 3.5–5.1)
Sodium: 144 mmol/L (ref 135–145)

## 2023-08-23 LAB — BRAIN NATRIURETIC PEPTIDE: B Natriuretic Peptide: 164 pg/mL — ABNORMAL HIGH (ref 0.0–100.0)

## 2023-08-23 MED ORDER — ENSURE ENLIVE PO LIQD: 237.0000 mL | Freq: Two times a day (BID) | ORAL | Status: DC

## 2023-08-23 MED ORDER — ADULT MULTIVITAMIN W/MINERALS CH: 1.0000 | ORAL_TABLET | Freq: Every day | ORAL | Status: DC

## 2023-08-23 MED ORDER — ASPIRIN 81 MG PO TBEC
81.0000 mg | DELAYED_RELEASE_TABLET | Freq: Every day | ORAL | Status: DC
  Administered 2023-08-23: 81 mg via ORAL
  Filled 2023-08-23: qty 1

## 2023-08-23 MED ORDER — DONEPEZIL HCL 5 MG PO TABS
5.0000 mg | ORAL_TABLET | Freq: Every day | ORAL | Status: DC
Start: 2023-08-23 — End: 2023-08-23
  Administered 2023-08-23: 5 mg via ORAL
  Filled 2023-08-23: qty 1

## 2023-08-23 MED ORDER — LEVOTHYROXINE SODIUM 50 MCG PO TABS
75.0000 ug | ORAL_TABLET | Freq: Every day | ORAL | Status: DC
  Administered 2023-08-23: 75 ug via ORAL
  Filled 2023-08-23: qty 1

## 2023-08-23 MED ORDER — IPRATROPIUM-ALBUTEROL 0.5-2.5 (3) MG/3ML IN SOLN: 3.0000 mL | Freq: Four times a day (QID) | RESPIRATORY_TRACT | 2 refills | Status: DC | PRN

## 2023-08-23 MED ORDER — PANTOPRAZOLE SODIUM 40 MG PO TBEC: 40.0000 mg | DELAYED_RELEASE_TABLET | Freq: Two times a day (BID) | ORAL | 2 refills | Status: DC

## 2023-08-23 MED ORDER — ADULT MULTIVITAMIN W/MINERALS CH
1.0000 | ORAL_TABLET | Freq: Every day | ORAL | Status: DC
  Administered 2023-08-23: 1 via ORAL
  Filled 2023-08-23: qty 1

## 2023-08-23 MED ORDER — PRAVASTATIN SODIUM 20 MG PO TABS
80.0000 mg | ORAL_TABLET | Freq: Every day | ORAL | Status: DC
  Administered 2023-08-23: 80 mg via ORAL
  Filled 2023-08-23: qty 4

## 2023-08-23 MED ORDER — LOPERAMIDE HCL 2 MG PO CAPS: 2.0000 mg | ORAL_CAPSULE | Freq: Two times a day (BID) | ORAL | Status: DC | PRN

## 2023-08-23 MED ORDER — FERROUS SULFATE 325 (65 FE) MG PO TABS
325.0000 mg | ORAL_TABLET | Freq: Every day | ORAL | Status: DC
  Administered 2023-08-23: 325 mg via ORAL
  Filled 2023-08-23: qty 1

## 2023-08-23 MED ORDER — POTASSIUM CHLORIDE 20 MEQ PO PACK
40.0000 meq | PACK | Freq: Once | ORAL | Status: AC
Start: 2023-08-23 — End: 2023-08-23
  Administered 2023-08-23: 40 meq via ORAL
  Filled 2023-08-23: qty 2

## 2023-08-23 NOTE — Discharge Summary (Signed)
 Physician Discharge Summary   Robin Graves  female DOB: 1941/03/13  ZOX:[id number]  PCP: Barbette Reichmann, MD  Admit date: 08/22/2023 Discharge date: 08/23/2023  Admitted From: home Disposition:  home Daughter updated at bedside prior to discharge. Home Health: Yes CODE STATUS: Full code   Hospital Course:  For full details, please see H&P, progress notes, consult notes and ancillary notes.  Briefly,  Robin Graves is a 83 y.o. female with medical history significant of COPD, CAD, diastolic CHF, stroke, hypothyroidism, dementia, GERD, hypothyroidism (thyroidectomy due to Graves' disease), lung mass, who presented with burning pain in esophagus.   Patient was recently hospitalized from 2/4 - 08/15/23 due to COPD exacerbation secondary to Flu A.  Patient was treated with Tamiflu and discharged on prednisone.  Her shortness of breath has improved, but developed the pain in esophagus, described as burning pain from oral along esophageal pathway in her chest.  The pain is constant, sharp, scratching, moderate, nonradiating.  She has poor appetite and decreased oral intake because of the pain.   Possible esophagitis:  Patient symptoms seem to be due to esophagitis.  Pt does have a hx of heavy ibuprofen use and was only recently told to stop by her PCP. --pt was on omeprazole PTA, however, daughter would like pt to try a different PPI, so Protonix 40 mg BID ordered. --d/c'ed ibuprofen   COPD (chronic obstructive pulmonary disease) (HCC):  Stable --DuoNeb PRN   Recent Influenza A infection:  Symptoms have dramatically improved.  Denies cough, SOB, fever or chills.   Leukocytosis:  WBC 23.5, no fever, does not seem to have an infection.  Maybe due to recent steroid use.  Patient received 1 dose of vancomycin and cefepime in ED, abx not continued.     Chronic diastolic CHF (congestive heart failure) (HCC):  2D echo on 03/22/2020 showed EF> 55%.  Patient does not have leg edema or JVD.  CHF  is compensated.   Lung mass on CT:  pt was found to have a lung mass in previous admission.  Patient is following up with pulmonologist, Dr. Meredeth Ide.  Pt missed her scheduled outpatient PET scan due to being in the hospital.  Family was aware the need to reschedule.   Hypertension:  Patient is not on antihypertensive PTA.   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  She has leukocytosis which is likely due to recent steroid use.  Low suspicions for C. Difficile.  Patient is taking laxative at home.   Dementia with behavioral disturbance (HCC) -Donepezil   Underweight, Severe malnutrition in context of chronic illness  --supplements    Unless noted above, medications under "STOP" list are ones pt was not taking PTA.  Discharge Diagnoses:  Principal Problem:   Esophagitis Active Problems:   COPD (chronic obstructive pulmonary disease) (HCC)   Influenza A   Leukocytosis   Chronic diastolic CHF (congestive heart failure) (HCC)   Lung mass on CT   Hypertension   CAD (coronary artery disease)   Hyperlipidemia   Postablative hypothyroidism   Dementia with behavioral disturbance (HCC)   Diarrhea   Protein-calorie malnutrition, severe (HCC)     Discharge Instructions:  Allergies as of 08/23/2023       Reactions   Codeine    unknown        Medication List  STOP taking these medications    guaiFENesin 600 MG 12 hr tablet Commonly known as: MUCINEX   ibuprofen 800 MG tablet Commonly known as: ADVIL   omeprazole 20 MG capsule Commonly known as: PRILOSEC   predniSONE 10 MG tablet Commonly known as: DELTASONE       TAKE these medications    albuterol 108 (90 Base) MCG/ACT inhaler Commonly known as: VENTOLIN HFA Inhale 2 puffs into the lungs every 6 (six) hours as  needed for wheezing or shortness of breath.   aspirin EC 81 MG tablet Take 81 mg by mouth daily. AM   cyanocobalamin 1000 MCG/ML injection Commonly known as: VITAMIN B12 INJECT 1 ML AS DIRECTED MONTHLY   donepezil 5 MG tablet Commonly known as: ARICEPT Take 5 mg by mouth in the morning.   feeding supplement Liqd Take 237 mLs by mouth 2 (two) times daily between meals. Start taking on: August 24, 2023   ferrous sulfate 325 (65 FE) MG tablet Take 1 tablet (325 mg total) by mouth daily with breakfast.   ipratropium-albuterol 0.5-2.5 (3) MG/3ML Soln Commonly known as: DUONEB Take 3 mLs by nebulization every 6 (six) hours as needed.   levothyroxine 75 MCG tablet Commonly known as: SYNTHROID Take 75 mcg by mouth daily before breakfast.   multivitamin with minerals Tabs tablet Take 1 tablet by mouth daily. Start taking on: August 24, 2023   pantoprazole 40 MG tablet Commonly known as: Protonix Take 1 tablet (40 mg total) by mouth 2 (two) times daily.   polyethylene glycol 17 g packet Commonly known as: MIRALAX / GLYCOLAX Take 17 g by mouth daily as needed for mild constipation or moderate constipation.   potassium chloride 10 MEQ tablet Commonly known as: KLOR-CON M Take 10 mEq by mouth 2 (two) times daily.   pravastatin 80 MG tablet Commonly known as: PRAVACHOL Take 1 tablet (80 mg total) by mouth daily.   SYSTANE OP Apply 1 drop to eye daily.         Follow-up Information     Barbette Reichmann, MD Follow up in 1 week(s).   Specialty: Internal Medicine Contact information: [address] [place] [address] [phone]                 Allergies  Allergen Reactions   Codeine     unknown     The results of significant diagnostics from this hospitalization (including imaging, microbiology, ancillary and laboratory) are listed below for reference.   Consultations:   Procedures/Studies: DG Chest 2  View Result Date: 08/22/2023 CLINICAL DATA:  Increasing pain in mouth, esophagus, and stomach for 1 week, dysphagia EXAM: CHEST - 2 VIEW COMPARISON:  08/14/2023, 08/13/2023 FINDINGS: Frontal and lateral views of the chest demonstrate a stable cardiac silhouette. The right upper lobe mass seen on prior CT is faintly apparent adjacent to the right hilum on the frontal view, concerning for malignancy based on previous CT imaging. The subcentimeter left upper lobe nodule seen on prior CT is not well visualized by x-ray. Stable asymmetric right apical pleural thickening, likely benign scarring given long-term stability. No acute airspace disease, effusion, or pneumothorax. Stable thoracic compression deformities with prior vertebral augmentations. IMPRESSION: 1. Stable right upper lobe perihilar mass concerning for malignancy based on previous CT findings. Subcentimeter left upper lobe nodule seen on prior CT is not visualized by x-ray. PET scan again recommended if not performed in the interim. 2. No acute airspace disease. Electronically Signed  By: Sharlet Salina M.D.   On: 08/22/2023 15:34   CT Angio Chest PE W and/or Wo Contrast Result Date: 08/14/2023 CLINICAL DATA:  Shortness of breath, abdominal pain, positive flu test on 4 days ago. Also, PA and lateral chest yesterday demonstrated suspected mass in the retrosternal clear space on the lateral view only. EXAM: CT ANGIOGRAPHY CHEST WITH CONTRAST TECHNIQUE: Multidetector CT imaging of the chest was performed using the standard protocol during bolus administration of intravenous contrast. Multiplanar CT image reconstructions and MIPs were obtained to evaluate the vascular anatomy. RADIATION DOSE REDUCTION: This exam was performed according to the departmental dose-optimization program which includes automated exposure control, adjustment of the mA and/or kV according to patient size and/or use of iterative reconstruction technique. CONTRAST:  75mL OMNIPAQUE  IOHEXOL 350 MG/ML SOLN COMPARISON:  PA and lateral chest yesterday, and chest, abdomen and pelvis CT with IV contrast 04/29/2020. FINDINGS: Cardiovascular: The cardiac size is normal. There is no pericardial effusion. Three-vessel coronary artery calcifications greatest in the LAD, additional scattered calcification in the left main coronary artery. There is moderate calcification and thickening of the aortic valve leaflets. Consider echocardiographic evaluation. The aorta is normal in caliber with mild tortuosity and moderate calcific plaques, with atherosclerosis in the great vessels. Pulmonary arteries are normal in caliber without evidence of thromboemboli. The pulmonary veins are slightly distended but no more than previously. Mediastinum/Nodes: No enlarged mediastinal, hilar, or axillary lymph nodes. Thyroid gland, trachea, and esophagus demonstrate no significant findings. There is a small hiatal hernia. Lungs/Pleura: There is right-greater-than-left biapical pleural-parenchymal scarring, with right apical volume loss and upward hilar retraction and left apical linear calcifications within the scarring. On the right scarring changes merge with coarsely nodular chronic scar-like opacities. There are mild centrilobular emphysematous changes in the lungs. Anteriorly in the base of the right upper lobe there is new demonstration of a lobular solid nodule with pleural stranding measuring 2.1 x 2 x 1.8 cm (measured on 5:56 and 7:32). This is highly worrisome for a primary bronchogenic neoplasm. PET-CT or tissue sampling recommended. In the left upper lobe a new nodule is also noted on 5:34, with slight cavitation and measuring 8 x 6 mm. This could be a neoplasm or an inflammatory nodule. In the left lower lobe, there is a chronic stable 4 mm nodule on 5:97. There are scattered linear scar-like opacities in the bases. There is no consolidation, effusion or further nodules. Upper Abdomen: No acute abnormality. Status  post cholecystectomy with chronically prominent common bile duct. Abdominal aortic atherosclerosis. Musculoskeletal: There are multilevel thoracic spine chronic compression fractures, with old kyphoplasty at T9 and 12. Osteopenia and degenerative change with thoracic kyphosis. No new or progressive thoracic compression fractures. The ribcage is intact. No destructive bone lesion. Bilateral breast implants noted with chronic collapse of the left implant. Review of the MIP images confirms the above findings. IMPRESSION: 1. No evidence of arterial dilatation or thromboembolism. 2. 2.1 x 2 x 1.8 cm lobular solid nodule with pleural stranding in the base of the right upper lobe anteriorly, highly worrisome for a primary bronchogenic neoplasm. PET-CT or tissue sampling recommended. 3. Additional new 8 x 6 mm cavitary nodule in the left upper lobe, could be a neoplasm or an inflammatory nodule. 4. Emphysema. 5. Aortic and coronary artery atherosclerosis. 6. Moderate calcification and thickening of the aortic valve leaflets. Consider echocardiographic evaluation. 7. Small hiatal hernia. 8. Osteopenia and degenerative change with multilevel thoracic spine chronic compression fractures, with old kyphoplasty at T9  and 12. 9. 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 Aortic Atherosclerosis (ICD10-I70.0) and Emphysema (ICD10-J43.9). Electronically Signed   By: Almira Bar M.D.   On: 08/14/2023 01:21   DG Chest 2 View Result Date: 08/13/2023 CLINICAL DATA:  Shortness of breath. EXAM: CHEST - 2 VIEW COMPARISON:  07/04/2013.  Chest CT dated 04/29/2020. FINDINGS: Enlarged cardiac silhouette. Hyperexpanded lungs. Interval nodular density overlying the anterior upper thorax on the lateral view, not seen on the frontal view. No significant change in right greater than left biapical pleural and parenchymal scarring. Multiple thoracic and lumbar compression deformities are again demonstrated with kyphoplasty at 2 levels. Diffuse  osteopenia. Mild-to-moderate dextroconvex thoracolumbar scoliosis. Cholecystectomy clips. Atheromatous aortic calcifications. IMPRESSION: 1. Interval nodular density overlying the anterior upper thorax on the lateral view, not seen on the frontal view. Recommend chest CT for further evaluation. 2. COPD. 3. Cardiomegaly. Electronically Signed   By: Beckie Salts M.D.   On: 08/13/2023 19:39      Labs: BNP (last 3 results) Recent Labs    08/23/23 0446  BNP 164.0*   Basic Metabolic Panel: Recent Labs  Lab 08/22/23 1329 08/23/23 0446  NA 144 144  K 3.7 3.0*  CL 111 114*  CO2 20* 23  GLUCOSE 142* 88  BUN 36* 33*  CREATININE 1.05* 0.86  CALCIUM 9.6 9.0   Liver Function Tests: No results for input(s): "AST", "ALT", "ALKPHOS", "BILITOT", "PROT", "ALBUMIN" in the last 168 hours. No results for input(s): "LIPASE", "AMYLASE" in the last 168 hours. No results for input(s): "AMMONIA" in the last 168 hours. CBC: Recent Labs  Lab 08/22/23 1329 08/23/23 0446  WBC 23.5* 20.9*  HGB 11.9* 10.8*  HCT 36.2 32.9*  MCV 94.5 94.3  PLT 196 159   Cardiac Enzymes: No results for input(s): "CKTOTAL", "CKMB", "CKMBINDEX", "TROPONINI" in the last 168 hours. BNP: Invalid input(s): "POCBNP" CBG: No results for input(s): "GLUCAP" in the last 168 hours. D-Dimer No results for input(s): "DDIMER" in the last 72 hours. Hgb A1c No results for input(s): "HGBA1C" in the last 72 hours. Lipid Profile No results for input(s): "CHOL", "HDL", "LDLCALC", "TRIG", "CHOLHDL", "LDLDIRECT" in the last 72 hours. Thyroid function studies No results for input(s): "TSH", "T4TOTAL", "T3FREE", "THYROIDAB" in the last 72 hours.  Invalid input(s): "FREET3" Anemia work up No results for input(s): "VITAMINB12", "FOLATE", "FERRITIN", "TIBC", "IRON", "RETICCTPCT" in the last 72 hours. Urinalysis    Component Value Date/Time   COLORURINE YELLOW (A) 10/15/2016 1613   APPEARANCEUR HAZY (A) 10/15/2016 1613   APPEARANCEUR  Clear 12/11/2012 0347   LABSPEC 1.026 10/15/2016 1613   LABSPEC 1.011 12/11/2012 0347   PHURINE 5.0 10/15/2016 1613   GLUCOSEU NEGATIVE 10/15/2016 1613   GLUCOSEU Negative 12/11/2012 0347   HGBUR NEGATIVE 10/15/2016 1613   BILIRUBINUR NEGATIVE 10/15/2016 1613   BILIRUBINUR Negative 12/11/2012 0347   KETONESUR NEGATIVE 10/15/2016 1613   PROTEINUR 100 (A) 10/15/2016 1613   NITRITE NEGATIVE 10/15/2016 1613   LEUKOCYTESUR MODERATE (A) 10/15/2016 1613   LEUKOCYTESUR 2+ 12/11/2012 0347   Sepsis Labs Recent Labs  Lab 08/22/23 1329 08/23/23 0446  WBC 23.5* 20.9*   Microbiology Recent Results (from the past 240 hours)  Blood culture (routine x 2)     Status: None (Preliminary result)   Collection Time: 08/22/23  4:51 PM   Specimen: BLOOD  Result Value Ref Range Status   Specimen Description BLOOD BLOOD RIGHT WRIST  Final   Special Requests   Final    BOTTLES DRAWN AEROBIC AND  ANAEROBIC Blood Culture results may not be optimal due to an inadequate volume of blood received in culture bottles   Culture   Final    NO GROWTH < 12 HOURS Performed at [facility], [address] [place], [place] [address]    Report Status PENDING  Incomplete  Blood culture (routine x 2)     Status: None (Preliminary result)   Collection Time: 08/22/23  4:51 PM   Specimen: BLOOD  Result Value Ref Range Status   Specimen Description BLOOD RIGHT ANTECUBITAL  Final   Special Requests   Final    BOTTLES DRAWN AEROBIC AND ANAEROBIC Blood Culture adequate volume   Culture   Final    NO GROWTH < 12 HOURS Performed at [facility], [address], [place], [place] [address]    Report Status PENDING  Incomplete     Total time spend on discharging this patient, including the last patient exam, discussing the hospital stay, instructions for ongoing care as it relates to all pertinent caregivers, as well as preparing the medical discharge records, prescriptions, and/or referrals as  applicable, is 60 minutes.    Darlin Priestly, MD  [facility] 08/23/2023, 4:32 PM

## 2023-08-23 NOTE — TOC CM/SW Note (Signed)
Transition of Care [facility]) - Inpatient Brief Assessment   Patient Details  Name: ASHELEY HELLBERG MRN: [id number] Date of Birth: Jun 08, 1941  Transition of Care [facility]) CM/SW Contact:    Allena Katz, LCSW Phone Number: 08/23/2023, 12:19 PM   Clinical Narrative:    Transition of Care Asessment: Insurance and Status: Insurance coverage has been reviewed Patient has primary care physician: Yes Home environment has been reviewed: ALONE Prior level of function:: independent Prior/Current Home Services: No current home services Social Drivers of Health Review: SDOH reviewed no interventions necessary Readmission risk has been reviewed: Yes Transition of care needs: no transition of care needs at this time

## 2023-08-23 NOTE — Progress Notes (Signed)
Initial Nutrition Assessment  DOCUMENTATION CODES:   Underweight, Severe malnutrition in context of chronic illness  INTERVENTION:   -Liberalize diet to regular for widest variety of meal selections -MVI with minerals daily -Continue Ensure Enlive po BID, each supplement provides 350 kcal and 20 grams of protein  NUTRITION DIAGNOSIS:   Severe Malnutrition related to chronic illness (COPD, CHF) as evidenced by moderate fat depletion, severe fat depletion, moderate muscle depletion, severe muscle depletion.  GOAL:   Patient will meet greater than or equal to 90% of their needs  MONITOR:   PO intake, Supplement acceptance  REASON FOR ASSESSMENT:   Consult Assessment of nutrition requirement/status  ASSESSMENT:   Pt with medical history significant of COPD, HLD, CAD, diastolic CHF, stroke, hypothyroidism, dementia, GERD, anemia, hypothyroidism (s/p of thyroidectomy due to Graves' disease), migraine headache, lung mass, who presents with burning pain in esophagus.  Pt admitted with esophagitis and COPD.   Reviewed I/O's: +300 ml x 24 hours    Per MD notes, pt with dry mucous mebanes with dehyrations due to poor oralintake secondary to pain.   Per H&P, pt was recently hospitalized from 1/24-08/15/23 secondary COPD exacerbation and flu A; treated with tamiful and discharged on predusone.   Per MD notes, pt with lung mass and plan for PET scan.   Spoke with pt at bedside, who was pleasant and in good spirits today. She reports feeling better. Per pt, she reports that she has had intermittent pain when swallowing- after swallowing she would intermittently feel "burning" in esophagus area, but quickly resolves. Per pt, she shares that this happens on and off and is unable to associate any patterns in regards to foods, times of day, or frequency in which this happens, but thinks that this is overall improving. Pt took a sip of Ensure during evaluation and assured this RD that she was  not currently experiencing any pain when swallowing. Pt reports this started happening during last hospitalization, however, has improved since she last discharged home.   PTA, pt reports fair appetite. She usually consumes 4-6 meals per day, which consist of sandwiches, honey buns, cheese and crackers, and peanut butter crackers. She also consumes 1-2 Ensure supplements daily (prefers chocolate and butter pecan flavors). Pt shares that she lives alone and is able to drive herself to appointments and grocery store; denies any food insecurity issues. Per pt, she has a large support system including neighbors, friends, and church members who are more than willing to assist if needed (these supports often provide her with meals and snacks).   Pt shares that she has always been small framed. Her UBW is around 90#. Per pt, she lost about 10# last month, but weight is slowly "picking up". Reviewed wt hx; pt with distant history of weight loss and mild wt gain over the past 10 days.  Per pt, she refused her breakfast tray as she was told only to drink liquids for PET scan today. She denies any difficulty chewing or swallowing foods. Discussed importance of good meal and supplement intake to promote healing. Pt amemable to continue Ensure. She is hopeful to discharge home today after testing. Pt with no further concerns, but expressed appreciation for visit.   Medications reviewed and include ferrous sulfate and protonix.   Labs reviewed: K: 3.0 (inpatient orders for glycemic control are none).    NUTRITION - FOCUSED PHYSICAL EXAM:  Flowsheet Row Most Recent Value  Orbital Region Moderate depletion  Upper Arm Region Severe depletion  Thoracic  and Lumbar Region Severe depletion  Buccal Region Severe depletion  Temple Region Moderate depletion  Clavicle Bone Region Severe depletion  Clavicle and Acromion Bone Region Severe depletion  Scapular Bone Region Severe depletion  Dorsal Hand Severe depletion   Patellar Region Severe depletion  Anterior Thigh Region Severe depletion  Posterior Calf Region Severe depletion  Edema (RD Assessment) None  Hair Reviewed  Eyes Reviewed  Mouth Reviewed  Skin Reviewed  Nails Reviewed       Diet Order:   Diet Order             Diet regular Room service appropriate? Yes; Fluid consistency: Thin  Diet effective now                   EDUCATION NEEDS:   Education needs have been addressed  Skin:  Skin Assessment: Reviewed RN Assessment  Last BM:  08/22/23  Height:   Ht Readings from Last 1 Encounters:  08/22/23 5' (1.524 m)    Weight:   Wt Readings from Last 1 Encounters:  08/23/23 42.1 kg    Ideal Body Weight:  45.5 kg  BMI:  Body mass index is 18.13 kg/m.  Estimated Nutritional Needs:   Kcal:  1250-1450  Protein:  65-80 grams  Fluid:  > 1.2 L    Levada Schilling, RD, LDN, CDCES Registered Dietitian III Certified Diabetes Care and Education Specialist If unable to reach this RD, please use "RD Inpatient" group chat on secure chat between hours of 8am-4 pm daily

## 2023-08-23 NOTE — TOC Transition Note (Signed)
Transition of Care [facility]) - Discharge Note   Patient Details  Name: Robin Graves MRN: [id number] Date of Birth: [birth year]  Transition of Care [facility]) CM/SW Contact:  Allena Katz, LCSW Phone Number: 08/23/2023, 2:37 PM   Clinical Narrative:   Pt discharging home with [facility]. Pt reports that she wants HH no preference. Referral accepted by [facility] for PT/OT/AID.     Final next level of care: Home w Home Health Services Barriers to Discharge: Barriers Resolved   Patient Goals and CMS Choice Patient states their goals for this hospitalization and ongoing recovery are:: return home with [facility]   Choice offered to / list presented to : Patient      Discharge Placement                Patient to be transferred to facility by: Daughter   Patient and family notified of of transfer: 08/23/23  Discharge Plan and Services Additional resources added to the After Visit Summary for                            [facility] Arranged: PT, OT, Nurse's Aide [facility] Agency: [facility] Date [facility] Agency Contacted: 08/23/23   Representative spoke with at [facility] Agency: [place]  Social Drivers of Health (SDOH) Interventions SDOH Screenings   Food Insecurity: No Food Insecurity (08/23/2023)  Housing: Low Risk  (08/23/2023)  Transportation Needs: No Transportation Needs (08/23/2023)  Utilities: Not At Risk (08/23/2023)  Financial Resource Strain: Low Risk  (08/21/2023)   Received from [facility]  Social Connections: Moderately Integrated (08/23/2023)  Tobacco Use: Medium Risk (08/22/2023)     Readmission Risk Interventions     No data to display

## 2023-08-23 NOTE — Plan of Care (Signed)

## 2023-08-26 ENCOUNTER — Other Ambulatory Visit: Payer: Self-pay

## 2023-08-26 ENCOUNTER — Emergency Department: Payer: Medicare PPO

## 2023-08-26 ENCOUNTER — Inpatient Hospital Stay
Admit: 2023-08-26 | Discharge: 2023-08-26 | Disposition: A | Discharge status: Home / Self Care | Payer: Medicare PPO | Attending: Student

## 2023-08-26 ENCOUNTER — Telehealth (HOSPITAL_COMMUNITY): Payer: Self-pay | Admitting: Pharmacy Technician

## 2023-08-26 ENCOUNTER — Inpatient Hospital Stay
Admission: EM | Admit: 2023-08-26 | Discharge: 2023-09-01 | DRG: 871 | Disposition: A | Discharge status: Home Health | Payer: Medicare PPO | Attending: Internal Medicine | Admitting: Internal Medicine

## 2023-08-26 ENCOUNTER — Other Ambulatory Visit (HOSPITAL_COMMUNITY): Payer: Self-pay

## 2023-08-26 DIAGNOSIS — Z7901 Long term (current) use of anticoagulants: Secondary | ICD-10-CM

## 2023-08-26 DIAGNOSIS — R579 Shock, unspecified: Secondary | ICD-10-CM

## 2023-08-26 DIAGNOSIS — M159 Polyosteoarthritis, unspecified: Secondary | ICD-10-CM | POA: Diagnosis present

## 2023-08-26 DIAGNOSIS — A419 Sepsis, unspecified organism: Principal | ICD-10-CM | POA: Diagnosis present

## 2023-08-26 DIAGNOSIS — E872 Acidosis, unspecified: Secondary | ICD-10-CM | POA: Diagnosis present

## 2023-08-26 DIAGNOSIS — R918 Other nonspecific abnormal finding of lung field: Secondary | ICD-10-CM | POA: Diagnosis present

## 2023-08-26 DIAGNOSIS — R627 Adult failure to thrive: Secondary | ICD-10-CM | POA: Diagnosis present

## 2023-08-26 DIAGNOSIS — Z860100 Personal history of colon polyps, unspecified: Secondary | ICD-10-CM

## 2023-08-26 DIAGNOSIS — R570 Cardiogenic shock: Secondary | ICD-10-CM | POA: Diagnosis present

## 2023-08-26 DIAGNOSIS — I959 Hypotension, unspecified: Secondary | ICD-10-CM

## 2023-08-26 DIAGNOSIS — R6521 Severe sepsis with septic shock: Secondary | ICD-10-CM | POA: Diagnosis present

## 2023-08-26 DIAGNOSIS — Z515 Encounter for palliative care: Secondary | ICD-10-CM | POA: Diagnosis not present

## 2023-08-26 DIAGNOSIS — J690 Pneumonitis due to inhalation of food and vomit: Secondary | ICD-10-CM | POA: Diagnosis present

## 2023-08-26 DIAGNOSIS — I5032 Chronic diastolic (congestive) heart failure: Secondary | ICD-10-CM | POA: Diagnosis present

## 2023-08-26 DIAGNOSIS — G9341 Metabolic encephalopathy: Secondary | ICD-10-CM | POA: Diagnosis present

## 2023-08-26 DIAGNOSIS — Z789 Other specified health status: Secondary | ICD-10-CM | POA: Diagnosis not present

## 2023-08-26 DIAGNOSIS — J439 Emphysema, unspecified: Secondary | ICD-10-CM | POA: Diagnosis present

## 2023-08-26 DIAGNOSIS — J9601 Acute respiratory failure with hypoxia: Secondary | ICD-10-CM | POA: Diagnosis present

## 2023-08-26 DIAGNOSIS — E43 Unspecified severe protein-calorie malnutrition: Secondary | ICD-10-CM | POA: Diagnosis present

## 2023-08-26 DIAGNOSIS — R1314 Dysphagia, pharyngoesophageal phase: Secondary | ICD-10-CM | POA: Diagnosis present

## 2023-08-26 DIAGNOSIS — F03918 Unspecified dementia, unspecified severity, with other behavioral disturbance: Secondary | ICD-10-CM | POA: Diagnosis present

## 2023-08-26 DIAGNOSIS — R1319 Other dysphagia: Secondary | ICD-10-CM | POA: Diagnosis not present

## 2023-08-26 DIAGNOSIS — E870 Hyperosmolality and hypernatremia: Secondary | ICD-10-CM | POA: Diagnosis not present

## 2023-08-26 DIAGNOSIS — J449 Chronic obstructive pulmonary disease, unspecified: Secondary | ICD-10-CM | POA: Diagnosis present

## 2023-08-26 DIAGNOSIS — D638 Anemia in other chronic diseases classified elsewhere: Secondary | ICD-10-CM | POA: Diagnosis present

## 2023-08-26 DIAGNOSIS — Z823 Family history of stroke: Secondary | ICD-10-CM

## 2023-08-26 DIAGNOSIS — I480 Paroxysmal atrial fibrillation: Secondary | ICD-10-CM | POA: Diagnosis present

## 2023-08-26 DIAGNOSIS — E039 Hypothyroidism, unspecified: Secondary | ICD-10-CM | POA: Diagnosis present

## 2023-08-26 DIAGNOSIS — E860 Dehydration: Secondary | ICD-10-CM | POA: Diagnosis present

## 2023-08-26 DIAGNOSIS — D696 Thrombocytopenia, unspecified: Secondary | ICD-10-CM | POA: Insufficient documentation

## 2023-08-26 DIAGNOSIS — Z79899 Other long term (current) drug therapy: Secondary | ICD-10-CM

## 2023-08-26 DIAGNOSIS — E162 Hypoglycemia, unspecified: Secondary | ICD-10-CM | POA: Diagnosis present

## 2023-08-26 DIAGNOSIS — N179 Acute kidney failure, unspecified: Secondary | ICD-10-CM | POA: Diagnosis present

## 2023-08-26 DIAGNOSIS — Z79890 Hormone replacement therapy (postmenopausal): Secondary | ICD-10-CM

## 2023-08-26 DIAGNOSIS — E876 Hypokalemia: Secondary | ICD-10-CM | POA: Diagnosis present

## 2023-08-26 DIAGNOSIS — Z1152 Encounter for screening for COVID-19: Secondary | ICD-10-CM | POA: Diagnosis not present

## 2023-08-26 DIAGNOSIS — Z681 Body mass index (BMI) 19 or less, adult: Secondary | ICD-10-CM

## 2023-08-26 DIAGNOSIS — Z885 Allergy status to narcotic agent status: Secondary | ICD-10-CM

## 2023-08-26 DIAGNOSIS — R64 Cachexia: Secondary | ICD-10-CM | POA: Diagnosis present

## 2023-08-26 DIAGNOSIS — Z7189 Other specified counseling: Secondary | ICD-10-CM | POA: Diagnosis not present

## 2023-08-26 DIAGNOSIS — Z9013 Acquired absence of bilateral breasts and nipples: Secondary | ICD-10-CM

## 2023-08-26 DIAGNOSIS — D6959 Other secondary thrombocytopenia: Secondary | ICD-10-CM | POA: Diagnosis present

## 2023-08-26 DIAGNOSIS — J440 Chronic obstructive pulmonary disease with acute lower respiratory infection: Secondary | ICD-10-CM | POA: Diagnosis present

## 2023-08-26 DIAGNOSIS — Z66 Do not resuscitate: Secondary | ICD-10-CM | POA: Diagnosis present

## 2023-08-26 DIAGNOSIS — Z8673 Personal history of transient ischemic attack (TIA), and cerebral infarction without residual deficits: Secondary | ICD-10-CM

## 2023-08-26 DIAGNOSIS — K2100 Gastro-esophageal reflux disease with esophagitis, without bleeding: Secondary | ICD-10-CM | POA: Diagnosis present

## 2023-08-26 DIAGNOSIS — C3411 Malignant neoplasm of upper lobe, right bronchus or lung: Secondary | ICD-10-CM | POA: Diagnosis present

## 2023-08-26 DIAGNOSIS — G43909 Migraine, unspecified, not intractable, without status migrainosus: Secondary | ICD-10-CM | POA: Diagnosis present

## 2023-08-26 DIAGNOSIS — Z87891 Personal history of nicotine dependence: Secondary | ICD-10-CM

## 2023-08-26 DIAGNOSIS — E785 Hyperlipidemia, unspecified: Secondary | ICD-10-CM | POA: Diagnosis present

## 2023-08-26 DIAGNOSIS — Z7982 Long term (current) use of aspirin: Secondary | ICD-10-CM

## 2023-08-26 DIAGNOSIS — I4891 Unspecified atrial fibrillation: Principal | ICD-10-CM

## 2023-08-26 DIAGNOSIS — Z91014 Allergy to mammalian meats: Secondary | ICD-10-CM

## 2023-08-26 DIAGNOSIS — Z8249 Family history of ischemic heart disease and other diseases of the circulatory system: Secondary | ICD-10-CM

## 2023-08-26 DIAGNOSIS — I21A1 Myocardial infarction type 2: Secondary | ICD-10-CM | POA: Diagnosis present

## 2023-08-26 DIAGNOSIS — I2510 Atherosclerotic heart disease of native coronary artery without angina pectoris: Secondary | ICD-10-CM | POA: Diagnosis present

## 2023-08-26 DIAGNOSIS — K224 Dyskinesia of esophagus: Secondary | ICD-10-CM | POA: Diagnosis present

## 2023-08-26 DIAGNOSIS — J189 Pneumonia, unspecified organism: Secondary | ICD-10-CM | POA: Diagnosis present

## 2023-08-26 DIAGNOSIS — J441 Chronic obstructive pulmonary disease with (acute) exacerbation: Secondary | ICD-10-CM | POA: Diagnosis present

## 2023-08-26 DIAGNOSIS — Z90710 Acquired absence of both cervix and uterus: Secondary | ICD-10-CM

## 2023-08-26 DIAGNOSIS — Z833 Family history of diabetes mellitus: Secondary | ICD-10-CM

## 2023-08-26 LAB — COMPREHENSIVE METABOLIC PANEL
ALT: 27 U/L (ref 0–44)
AST: 31 U/L (ref 15–41)
Albumin: 1.8 g/dL — ABNORMAL LOW (ref 3.5–5.0)
Alkaline Phosphatase: 93 U/L (ref 38–126)
Anion gap: 9 (ref 5–15)
BUN: 55 mg/dL — ABNORMAL HIGH (ref 8–23)
CO2: 20 mmol/L — ABNORMAL LOW (ref 22–32)
Calcium: 7.8 mg/dL — ABNORMAL LOW (ref 8.9–10.3)
Chloride: 120 mmol/L — ABNORMAL HIGH (ref 98–111)
Creatinine, Ser: 1.34 mg/dL — ABNORMAL HIGH (ref 0.44–1.00)
GFR, Estimated: 39 mL/min — ABNORMAL LOW (ref >60)
Glucose, Bld: 93 mg/dL (ref 70–99)
Potassium: 2.9 mmol/L — ABNORMAL LOW (ref 3.5–5.1)
Sodium: 149 mmol/L — ABNORMAL HIGH (ref 135–145)
Total Bilirubin: 0.8 mg/dL (ref 0.0–1.2)
Total Protein: 5.3 g/dL — ABNORMAL LOW (ref 6.5–8.1)

## 2023-08-26 LAB — CBC WITH DIFFERENTIAL/PLATELET
Abs Immature Granulocytes: 0.25 10*3/uL — ABNORMAL HIGH (ref 0.00–0.07)
Basophils Absolute: 0 10*3/uL (ref 0.0–0.1)
Basophils Relative: 0 %
Eosinophils Absolute: 0 10*3/uL (ref 0.0–0.5)
Eosinophils Relative: 0 %
HCT: 28.1 % — ABNORMAL LOW (ref 36.0–46.0)
Hemoglobin: 9 g/dL — ABNORMAL LOW (ref 12.0–15.0)
Immature Granulocytes: 2 %
Lymphocytes Relative: 4 %
Lymphs Abs: 0.6 10*3/uL — ABNORMAL LOW (ref 0.7–4.0)
MCH: 31.5 pg (ref 26.0–34.0)
MCHC: 32 g/dL (ref 30.0–36.0)
MCV: 98.3 fL (ref 80.0–100.0)
Monocytes Absolute: 0.3 10*3/uL (ref 0.1–1.0)
Monocytes Relative: 2 %
Neutro Abs: 13.2 10*3/uL — ABNORMAL HIGH (ref 1.7–7.7)
Neutrophils Relative %: 92 %
Platelets: 149 10*3/uL — ABNORMAL LOW (ref 150–400)
RBC: 2.86 MIL/uL — ABNORMAL LOW (ref 3.87–5.11)
RDW: 14 % (ref 11.5–15.5)
WBC: 14.5 10*3/uL — ABNORMAL HIGH (ref 4.0–10.5)
nRBC: 0 % (ref 0.0–0.2)

## 2023-08-26 LAB — ECHOCARDIOGRAM COMPLETE
AR max vel: 1.8 cm2
AV Area VTI: 1.9 cm2
AV Area mean vel: 1.96 cm2
AV Mean grad: 9.3 mm[Hg]
AV Peak grad: 16.6 mm[Hg]
Ao pk vel: 2.04 m/s
Area-P 1/2: 3.76 cm2
MV M vel: 4.98 m/s
MV Peak grad: 99.2 mm[Hg]
MV VTI: 2.94 cm2
Radius: 0.4 cm
S' Lateral: 1.5 cm

## 2023-08-26 LAB — URINALYSIS, ROUTINE W REFLEX MICROSCOPIC
Bilirubin Urine: NEGATIVE
Glucose, UA: NEGATIVE mg/dL
Ketones, ur: NEGATIVE mg/dL
Leukocytes,Ua: NEGATIVE
Nitrite: NEGATIVE
Protein, ur: 100 mg/dL — AB
Specific Gravity, Urine: 1.026 (ref 1.005–1.030)
pH: 5 (ref 5.0–8.0)

## 2023-08-26 LAB — TROPONIN I (HIGH SENSITIVITY)
Troponin I (High Sensitivity): 24 ng/L — ABNORMAL HIGH (ref <18)
Troponin I (High Sensitivity): 159 ng/L (ref <18)
Troponin I (High Sensitivity): 540 ng/L (ref <18)
Troponin I (High Sensitivity): 649 ng/L (ref <18)
Troponin I (High Sensitivity): 763 ng/L (ref <18)

## 2023-08-26 LAB — RESP PANEL BY RT-PCR (RSV, FLU A&B, COVID)  RVPGX2
Influenza A by PCR: NEGATIVE
Influenza B by PCR: NEGATIVE
Resp Syncytial Virus by PCR: NEGATIVE
SARS Coronavirus 2 by RT PCR: NEGATIVE

## 2023-08-26 LAB — PROTIME-INR
INR: 1.6 — ABNORMAL HIGH (ref 0.8–1.2)
Prothrombin Time: 19.6 s — ABNORMAL HIGH (ref 11.4–15.2)

## 2023-08-26 LAB — T4, FREE: Free T4: 1.09 ng/dL (ref 0.61–1.12)

## 2023-08-26 LAB — BLOOD GAS, VENOUS

## 2023-08-26 LAB — MAGNESIUM: Magnesium: 2.2 mg/dL (ref 1.7–2.4)

## 2023-08-26 LAB — BRAIN NATRIURETIC PEPTIDE: B Natriuretic Peptide: 402.6 pg/mL — ABNORMAL HIGH (ref 0.0–100.0)

## 2023-08-26 LAB — LACTIC ACID, PLASMA
Lactic Acid, Venous: 2.6 mmol/L (ref 0.5–1.9)
Lactic Acid, Venous: 1.9 mmol/L (ref 0.5–1.9)

## 2023-08-26 LAB — CK: Total CK: 62 U/L (ref 38–234)

## 2023-08-26 LAB — TSH: TSH: 1.469 u[IU]/mL (ref 0.350–4.500)

## 2023-08-26 LAB — MRSA NEXT GEN BY PCR, NASAL: MRSA by PCR Next Gen: NOT DETECTED

## 2023-08-26 LAB — APTT: aPTT: 40 s — ABNORMAL HIGH (ref 24–36)

## 2023-08-26 LAB — HEPARIN LEVEL (UNFRACTIONATED): Heparin Unfractionated: 0.27 [IU]/mL — ABNORMAL LOW (ref 0.30–0.70)

## 2023-08-26 MED ORDER — CHLORHEXIDINE GLUCONATE CLOTH 2 % EX PADS
6.0000 | MEDICATED_PAD | Freq: Every day | CUTANEOUS | Status: DC
  Administered 2023-08-26 – 2023-08-29 (×3): 6 via TOPICAL

## 2023-08-26 MED ORDER — ACETAMINOPHEN 325 MG PO TABS: 650.0000 mg | ORAL_TABLET | ORAL | Status: DC | PRN

## 2023-08-26 MED ORDER — PHENYLEPHRINE 80 MCG/ML (10ML) SYRINGE FOR IV PUSH (FOR BLOOD PRESSURE SUPPORT): 80.0000 ug | PREFILLED_SYRINGE | Freq: Once | INTRAVENOUS | Status: DC | PRN

## 2023-08-26 MED ORDER — MIDAZOLAM HCL 2 MG/2ML IJ SOLN
INTRAMUSCULAR | Status: AC
  Administered 2023-08-26: 2 mg
  Filled 2023-08-26: qty 2

## 2023-08-26 MED ORDER — ONDANSETRON HCL 4 MG/2ML IJ SOLN: 4.0000 mg | Freq: Four times a day (QID) | INTRAMUSCULAR | Status: DC | PRN

## 2023-08-26 MED ORDER — MIDAZOLAM HCL 2 MG/2ML IJ SOLN
INTRAMUSCULAR | Status: AC
  Filled 2023-08-26: qty 2

## 2023-08-26 MED ORDER — POTASSIUM CHLORIDE 10 MEQ/100ML IV SOLN
10.0000 meq | INTRAVENOUS | Status: AC
  Administered 2023-08-26: 10 meq via INTRAVENOUS
  Filled 2023-08-26 (×2): qty 100

## 2023-08-26 MED ORDER — AMIODARONE LOAD VIA INFUSION
150.0000 mg | Freq: Once | INTRAVENOUS | Status: AC
  Administered 2023-08-26: 150 mg via INTRAVENOUS
  Filled 2023-08-26: qty 83.34

## 2023-08-26 MED ORDER — METRONIDAZOLE 500 MG/100ML IV SOLN
500.0000 mg | Freq: Once | INTRAVENOUS | Status: AC
  Administered 2023-08-26: 500 mg via INTRAVENOUS
  Filled 2023-08-26: qty 100

## 2023-08-26 MED ORDER — POTASSIUM CHLORIDE 10 MEQ/100ML IV SOLN
10.0000 meq | INTRAVENOUS | Status: AC
Start: 2023-08-26 — End: 2023-08-26
  Administered 2023-08-26: 10 meq via INTRAVENOUS
  Filled 2023-08-26: qty 100

## 2023-08-26 MED ORDER — PHENYLEPHRINE HCL-NACL 20-0.9 MG/250ML-% IV SOLN
25.0000 ug/min | INTRAVENOUS | Status: DC
  Administered 2023-08-26: 40 ug/min via INTRAVENOUS
  Filled 2023-08-26 (×2): qty 250

## 2023-08-26 MED ORDER — PHENYLEPHRINE 200 MCG/ML FOR PRIAPISM / HYPOTENSION: 50.0000 ug | Freq: Once | INTRAMUSCULAR | Status: DC

## 2023-08-26 MED ORDER — AMIODARONE HCL IN DEXTROSE 360-4.14 MG/200ML-% IV SOLN
60.0000 mg/h | INTRAVENOUS | Status: AC
  Administered 2023-08-26 (×2): 60 mg/h via INTRAVENOUS
  Filled 2023-08-26 (×2): qty 200

## 2023-08-26 MED ORDER — POTASSIUM CHLORIDE 10 MEQ/100ML IV SOLN
10.0000 meq | INTRAVENOUS | Status: AC
  Administered 2023-08-26 (×4): 10 meq via INTRAVENOUS
  Filled 2023-08-26 (×2): qty 100

## 2023-08-26 MED ORDER — DOCUSATE SODIUM 100 MG PO CAPS: 100.0000 mg | ORAL_CAPSULE | Freq: Two times a day (BID) | ORAL | Status: DC | PRN

## 2023-08-26 MED ORDER — POLYETHYLENE GLYCOL 3350 17 G PO PACK: 17.0000 g | PACK | Freq: Every day | ORAL | Status: DC | PRN

## 2023-08-26 MED ORDER — PHENYLEPHRINE 80 MCG/ML (10ML) SYRINGE FOR IV PUSH (FOR BLOOD PRESSURE SUPPORT)
PREFILLED_SYRINGE | INTRAVENOUS | Status: AC
  Filled 2023-08-26: qty 10

## 2023-08-26 MED ORDER — MIDAZOLAM HCL 2 MG/2ML IJ SOLN
2.0000 mg | Freq: Once | INTRAMUSCULAR | Status: AC
  Administered 2023-08-26: 2 mg via INTRAVENOUS

## 2023-08-26 MED ORDER — SODIUM CHLORIDE 0.9% FLUSH
3.0000 mL | Freq: Two times a day (BID) | INTRAVENOUS | Status: DC
  Administered 2023-08-26 – 2023-09-01 (×11): 3 mL via INTRAVENOUS

## 2023-08-26 MED ORDER — HEPARIN (PORCINE) 25000 UT/250ML-% IV SOLN
700.0000 [IU]/h | INTRAVENOUS | Status: DC
  Administered 2023-08-26: 600 [IU]/h via INTRAVENOUS
  Filled 2023-08-26: qty 250

## 2023-08-26 MED ORDER — POTASSIUM CHLORIDE CRYS ER 20 MEQ PO TBCR
40.0000 meq | EXTENDED_RELEASE_TABLET | Freq: Three times a day (TID) | ORAL | Status: DC
  Filled 2023-08-26: qty 2

## 2023-08-26 MED ORDER — PHENYLEPHRINE HCL-NACL 20-0.9 MG/250ML-% IV SOLN: 0.0000 ug/min | INTRAVENOUS | Status: DC

## 2023-08-26 MED ORDER — SODIUM CHLORIDE 0.9% FLUSH: 3.0000 mL | INTRAVENOUS | Status: DC | PRN

## 2023-08-26 MED ORDER — SODIUM CHLORIDE 0.9 % IV SOLN
2.0000 g | Freq: Once | INTRAVENOUS | Status: AC
  Administered 2023-08-26: 2 g via INTRAVENOUS
  Filled 2023-08-26: qty 12.5

## 2023-08-26 MED ORDER — ORAL CARE MOUTH RINSE: 15.0000 mL | OROMUCOSAL | Status: DC | PRN

## 2023-08-26 MED ORDER — SODIUM CHLORIDE 0.9 % IV BOLUS
1000.0000 mL | Freq: Once | INTRAVENOUS | Status: AC
  Administered 2023-08-26: 1000 mL via INTRAVENOUS

## 2023-08-26 MED ORDER — SODIUM CHLORIDE 0.9 % IV SOLN: 250.0000 mL | INTRAVENOUS | Status: AC | PRN

## 2023-08-26 MED ORDER — ASPIRIN 81 MG PO TBEC
81.0000 mg | DELAYED_RELEASE_TABLET | Freq: Every day | ORAL | Status: DC
  Administered 2023-08-26 – 2023-09-01 (×7): 81 mg via ORAL
  Filled 2023-08-26 (×7): qty 1

## 2023-08-26 MED ORDER — HEPARIN BOLUS VIA INFUSION
500.0000 [IU] | Freq: Once | INTRAVENOUS | Status: AC
  Administered 2023-08-26: 500 [IU] via INTRAVENOUS
  Filled 2023-08-26: qty 500

## 2023-08-26 MED ORDER — HEPARIN BOLUS VIA INFUSION
2100.0000 [IU] | Freq: Once | INTRAVENOUS | Status: AC
  Administered 2023-08-26: 2100 [IU] via INTRAVENOUS
  Filled 2023-08-26: qty 2100

## 2023-08-26 MED ORDER — VANCOMYCIN HCL IN DEXTROSE 1-5 GM/200ML-% IV SOLN
1000.0000 mg | Freq: Once | INTRAVENOUS | Status: AC
  Administered 2023-08-26: 1000 mg via INTRAVENOUS
  Filled 2023-08-26: qty 200

## 2023-08-26 MED ORDER — AMIODARONE HCL IN DEXTROSE 360-4.14 MG/200ML-% IV SOLN
30.0000 mg/h | INTRAVENOUS | Status: AC
  Administered 2023-08-26 – 2023-08-27 (×4): 30 mg/h via INTRAVENOUS
  Filled 2023-08-26 (×3): qty 200

## 2023-08-26 NOTE — ED Triage Notes (Signed)
 Pt to ED from Home via [facility] for hypotension and AFIB with RVR. Pt has no cardiac history that has been reported.   BP 64/36 R 28 02: 2L [place] 98%

## 2023-08-26 NOTE — ED Notes (Signed)
 Cardioversion at 200 jewels.

## 2023-08-26 NOTE — ED Notes (Signed)
 200J shock given

## 2023-08-26 NOTE — ED Notes (Signed)
Lab at the bedside 

## 2023-08-26 NOTE — Telephone Encounter (Signed)
 Patient Product/process development scientist completed.    The patient is insured through [employer]. Patient has Medicare and is not eligible for a copay card, but may be able to apply for patient assistance or Medicare RX Payment Plan (Patient Must reach out to their plan, if eligible for payment plan), if available.    Ran test claim for Eliquis 5 mg and the current 30 day co-pay is $455.20 due to a $450.00 deductible.  Will be $47.00 once deductible is met.  Ran test claim for Xarelto 20 mg and the current 30 day co-pay is $455.20 due to a $450.00 deductible.  Will be $47.00 once deductible is met.   This test claim was processed through [facility]- copay amounts may vary at other pharmacies due to pharmacy/plan contracts, or as the patient moves through the different stages of their insurance plan.     Robin Graves, CPHT Pharmacy Technician III Certified Patient Advocate [facility] Patient Advocate Team Direct Number: [phone]  Fax: [phone]

## 2023-08-26 NOTE — Consult Note (Signed)
 PHARMACY - ANTICOAGULATION CONSULT NOTE  Pharmacy Consult for Heparin  Indication: atrial fibrillation  Allergies  Allergen Reactions   Codeine     unknown    Patient Measurements:   Heparin Dosing Weight: 42.1 kg  Vital Signs: BP: 90/73 (02/17 1030) Pulse Rate: 87 (02/17 1045)  Labs: Recent Labs    08/26/23 0704  HGB 9.0*  HCT 28.1*  PLT 149*  CREATININE 1.34*  CKTOTAL 62  TROPONINIHS 24*    Estimated Creatinine Clearance: 21.1 mL/min (A) (by C-G formula based on SCr of 1.34 mg/dL (H)).   Medical History: Past Medical History:  Diagnosis Date   Anemia    Arthritis    BACK   Chicken pox    Colon polyps    COPD (chronic obstructive pulmonary disease) (HCC)    Diverticulosis    Emphysema of lung (HCC)    GERD (gastroesophageal reflux disease)    Graves disease    Graves' disease with exophthalmos    REMOVED ON THYROID MEDS   Headache    MIGRAINES, 1 EVERY DAY OR LESS OFTEN   HH (hiatus hernia)    Hyperlipidemia    Pernicious anemia    Stroke (HCC)    MINI STROKES 1995 A FEW BEFORE 1995   Wears dentures    UPPER AND LOWER    Medications:  (Not in a hospital admission)  Scheduled:   phenylephrine       sodium chloride flush  3 mL Intravenous Q12H   Infusions:   sodium chloride     amiodarone 60 mg/hr (08/26/23 0823)   Followed by   amiodarone     phenylephrine (NEO-SYNEPHRINE) Adult infusion 60 mcg/min (08/26/23 0856)   vancomycin 1,000 mg (08/26/23 1100)   PRN: sodium chloride, acetaminophen, docusate sodium, ondansetron (ZOFRAN) IV, phenylephrine, phenylephrine, polyethylene glycol, sodium chloride flush Anti-infectives (From admission, onward)    Start     Dose/Rate Route Frequency Ordered Stop   08/26/23 0845  ceFEPIme (MAXIPIME) 2 g in sodium chloride 0.9 % 100 mL IVPB        2 g 200 mL/hr over 30 Minutes Intravenous  Once 08/26/23 0843 08/26/23 0946   08/26/23 0845  metroNIDAZOLE (FLAGYL) IVPB 500 mg        500 mg 100 mL/hr over 60  Minutes Intravenous  Once 08/26/23 0843 08/26/23 1057   08/26/23 0845  vancomycin (VANCOCIN) IVPB 1000 mg/200 mL premix        1,000 mg 200 mL/hr over 60 Minutes Intravenous  Once 08/26/23 4098         Assessment: 83 y.o. female with a history of COPD, CAD, diastolic CHF, CVA, hypothyroidism, GERD, and dementia who presents with hypotension and new onset atrial fibrillation. CHADSVASc 7. Hgb trending down possibly hemodilutional.   Pharmacy consulted to start heparin.   Goal of Therapy:  Heparin level 0.3-0.7 units/ml Monitor platelets by anticoagulation protocol: Yes   Plan:  Give 2100 units bolus x 1 Start heparin infusion at 600 units/hr Check anti-Xa level in 8 hours and daily while on heparin Continue to monitor H&H and platelets  Ronnald Ramp, PharmD, BCPS 08/26/2023,11:06 AM

## 2023-08-26 NOTE — ED Provider Notes (Signed)
 [facility] Provider Note    Event Date/Time   First MD Initiated Contact with Patient 08/26/23 [phone]     (approximate)   History   Hypotension (With Afib RVR)   HPI  Robin Graves is a 83 y.o. female with a history of COPD, CAD, diastolic CHF, CVA, hypothyroidism, GERD, and dementia who presents with hypotension and atrial fibrillation.  Per EMS, the patient's family or friends had not heard from her in a day or 2 and when someone went to check on her she was found down.  EMS found that her blood pressure was 64/36 and she was in atrial fibrillation with RVR with a rate in the 170s.  The patient reported chest pain.  EMS gave a dose of diltiazem with an improvement in her heart rate to the 140s.  On ED arrival the patient is alert and able to say her name.  She states the chest pain is better and denies feeling short of breath.  She states she feels tired.  I reviewed the past medical records.  The patient was admitted to the hospitalist service briefly last week with esophageal pain thought to be due to esophagitis.  She was previously admitted for influenza earlier this month.   Physical Exam   Triage Vital Signs: ED Triage Vitals  Encounter Vitals Group     BP 08/26/23 0700 (!) 84/55     Systolic BP Percentile --      Diastolic BP Percentile --      Pulse Rate 08/26/23 0701 (!) 147     Resp 08/26/23 0708 (!) 24     Temp --      Temp src --      SpO2 08/26/23 0701 92 %     Weight --      Height --      Head Circumference --      Peak Flow --      Pain Score --      Pain Loc --      Pain Education --      Exclude from Growth Chart --     Most recent vital signs: Vitals:   08/26/23 1044 08/26/23 1045  BP:    Pulse: 87 87  Resp: (!) 21 20  SpO2: 100% 100%     General: Alert, oriented x 2, weak appearing. CV:  Tachycardic, irregular rhythm.  Slightly mottled appearing lower extremities. Resp:  Increased respiratory effort.  Diminished  breath sounds bilaterally with some rhonchi but no significant rales. Abd:  Soft and nontender.  No distention.  Other:  No peripheral edema.  Dry mucous membranes.  Motor intact in all extremities.   ED Results / Procedures / Treatments   Labs (all labs ordered are listed, but only abnormal results are displayed) Labs Reviewed  BLOOD GAS, VENOUS - Abnormal; Notable for the following components:      Result Value   pH, Ven 7.22 (*)    pCO2, Ven 42 (*)    Bicarbonate 17.2 (*)    Acid-base deficit 10.1 (*)    All other components within normal limits  COMPREHENSIVE METABOLIC PANEL - Abnormal; Notable for the following components:   Sodium 149 (*)    Potassium 2.9 (*)    Chloride 120 (*)    CO2 20 (*)    BUN 55 (*)    Creatinine, Ser 1.34 (*)    Calcium 7.8 (*)    Total Protein 5.3 (*)  Albumin 1.8 (*)    GFR, Estimated 39 (*)    All other components within normal limits  CBC WITH DIFFERENTIAL/PLATELET - Abnormal; Notable for the following components:   WBC 14.5 (*)    RBC 2.86 (*)    Hemoglobin 9.0 (*)    HCT 28.1 (*)    Platelets 149 (*)    Neutro Abs 13.2 (*)    Lymphs Abs 0.6 (*)    Abs Immature Granulocytes 0.25 (*)    All other components within normal limits  BRAIN NATRIURETIC PEPTIDE - Abnormal; Notable for the following components:   B Natriuretic Peptide 402.6 (*)    All other components within normal limits  LACTIC ACID, PLASMA - Abnormal; Notable for the following components:   Lactic Acid, Venous 2.6 (*)    All other components within normal limits  TROPONIN I (HIGH SENSITIVITY) - Abnormal; Notable for the following components:   Troponin I (High Sensitivity) 24 (*)    All other components within normal limits  RESP PANEL BY RT-PCR (RSV, FLU A&B, COVID)  RVPGX2  CULTURE, BLOOD (ROUTINE X 2)  CULTURE, BLOOD (ROUTINE X 2)  LACTIC ACID, PLASMA  CK  MAGNESIUM  URINALYSIS, ROUTINE W REFLEX MICROSCOPIC  TROPONIN I (HIGH SENSITIVITY)     EKG  ED ECG  REPORT I, Dionne Bucy, the attending physician, personally viewed and interpreted this ECG.  Date: 08/26/2023 EKG Time: 0712 Rate: 142 Rhythm: Atrial fibrillation with RVR QRS Axis: Borderline right axis Intervals: normal ST/T Wave abnormalities: Repolarization abnormality Narrative Interpretation: A-fib with RVR and repolarization abnormality; no evidence of acute ischemia   ED ECG REPORT I, Dionne Bucy, the attending physician, personally viewed and interpreted this ECG.  Date: 08/26/2023 EKG Time: 0919 Rate: 119 Rhythm: Atrial fibrillation QRS Axis: normal Intervals: Borderline prolonged QTc ST/T Wave abnormalities: Borderline repolarization abnormality Narrative Interpretation: no evidence of acute ischemia   RADIOLOGY  Chest x-ray: I independently viewed and interpreted the images; there are bilateral patchy interstitial opacities with no focal consolidation or effusion  PROCEDURES:  Critical Care performed: Yes, see critical care procedure note(s)  .Critical Care  Performed by: Dionne Bucy, MD Authorized by: Dionne Bucy, MD   Critical care provider statement:    Critical care time (minutes):  90   Critical care time was exclusive of:  Separately billable procedures and treating other patients   Critical care was necessary to treat or prevent imminent or life-threatening deterioration of the following conditions:  Cardiac failure and shock   Critical care was time spent personally by me on the following activities:  Development of treatment plan with patient or surrogate, discussions with consultants, evaluation of patient's response to treatment, examination of patient, ordering and review of laboratory studies, ordering and review of radiographic studies, ordering and performing treatments and interventions, pulse oximetry, re-evaluation of patient's condition, review of old charts and obtaining history from patient or surrogate   Care  discussed with: admitting provider      MEDICATIONS ORDERED IN ED: Medications  phenylephrine 80 mcg/10 mL injection (  Not Given 08/26/23 0758)  PHENYLephrine 80 mcg/ml in normal saline Adult IV Push Syringe (For Blood Pressure Support) (has no administration in time range)  phenylephrine (NEO-SYNEPHRINE) 20mg /NS premix infusion (60 mcg/min Intravenous Rate/Dose Change 08/26/23 0856)  amiodarone (NEXTERONE) 1.8 mg/mL load via infusion 150 mg (150 mg Intravenous Bolus from Bag 08/26/23 0807)    Followed by  amiodarone (NEXTERONE PREMIX) 360-4.14 MG/200ML-% (1.8 mg/mL) IV infusion (60 mg/hr Intravenous New  Bag/Given 08/26/23 0823)    Followed by  amiodarone (NEXTERONE PREMIX) 360-4.14 MG/200ML-% (1.8 mg/mL) IV infusion (has no administration in time range)  vancomycin (VANCOCIN) IVPB 1000 mg/200 mL premix (1,000 mg Intravenous New Bag/Given 08/26/23 1100)  sodium chloride flush (NS) 0.9 % injection 3 mL (3 mLs Intravenous Not Given 08/26/23 1100)  sodium chloride flush (NS) 0.9 % injection 3 mL (has no administration in time range)  0.9 %  sodium chloride infusion (has no administration in time range)  acetaminophen (TYLENOL) tablet 650 mg (has no administration in time range)  docusate sodium (COLACE) capsule 100 mg (has no administration in time range)  polyethylene glycol (MIRALAX / GLYCOLAX) packet 17 g (has no administration in time range)  ondansetron (ZOFRAN) injection 4 mg (has no administration in time range)  sodium chloride 0.9 % bolus 1,000 mL (0 mLs Intravenous Stopped 08/26/23 0823)  midazolam (VERSED) injection 2 mg (2 mg Intravenous Not Given 08/26/23 0718)  midazolam (VERSED) 2 MG/2ML injection (2 mg  Given 08/26/23 0756)  ceFEPIme (MAXIPIME) 2 g in sodium chloride 0.9 % 100 mL IVPB (0 g Intravenous Stopped 08/26/23 0946)  metroNIDAZOLE (FLAGYL) IVPB 500 mg (0 mg Intravenous Stopped 08/26/23 1057)     IMPRESSION / MDM / ASSESSMENT AND PLAN / ED COURSE  I reviewed the triage  vital signs and the nursing notes.  83 year old female with PMH as noted above presents after she was found down at her home, noted to be in A-fib with RVR and hypotensive.  The rate responded slightly to calcium channel blocker given by EMS but she remains in rapid A-fib and is hypotensive.  The patient is alert, able to answer questions, and denies chest pain or shortness of breath.  She has somewhat diminished breath sounds bilaterally but no obvious rales.  There is no peripheral edema.  Neurologic exam is nonfocal.  Differential diagnosis includes, but is not limited to, new onset atrial fibrillation, other tachydysrhythmia, CHF exacerbation, ACS, acute infection/sepsis, hypovolemia, rhabdomyolysis, acute kidney injury.  Patient's presentation is most consistent with acute presentation with potential threat to life or bodily function.  The patient is on the cardiac monitor to evaluate for evidence of arrhythmia and/or significant heart rate changes.  The patient arrived a few minutes prior to the start of my shift and on my arrival to the room the patient had received a liter of fluids from EMS and was receiving a second liter.  Blood pressure was in the 80s systolic with a heart rate in the 140s.  EKG shows atrial fibrillation with RVR.  The patient does not appear fluid overloaded.  We attempted fluid resuscitation for approximately 15 minutes, especially given that the patient was alert and had no chest pain.  However the patient remained significantly hypotensive; given that she was unstable narrow complex tachydysrhythmia, we gave 2 mg Versed for sedation and attempted synchronized cardioversion with 120J.  This was unsuccessful.  Cardioversion was attempted a second time with 200J, again unsuccessful.  Fluids were continued and I ordered a phenylephrine infusion.  While waiting for the phenylephrine to be prepared, we gave another dose of Versed and attempted cardioversion a third time it was  briefly successful; the patient returned to sinus rhythm for about 2 to 3 minutes.  She then went back into rapid atrial fibrillation with no improvement in the blood pressure.  Just prior to starting phenylephrine I attempted a fourth attempt at cardioversion with 200 J, which successfully converted her to sinus for approximately  1 minute.  By this time, the patient had completed her second liter of fluids.  Blood pressure was mildly improved but MAP was still below 65.  We started a phenylephrine infusion for blood pressure support and amiodarone bolus and infusion for rate control.  Bedside ultrasound shows good cardiac contractility and some scattered B-lines in the lungs.  Chest x-ray shows bilateral interstitial opacities, possibly due to edema.  O2 saturation readings have been unreliable, ranging from the 70s to 90s with a poor waveform, despite attempting readings in multiple locations.  This is likely due to decreased peripheral perfusion.  The patient was somnolent after the Versed, but she is now alert, able to answer questions, is slightly Neck but not in respiratory distress, states she does not feel short of breath.  I have her on 4 L O2, and during the periods when she was in sinus rhythm, there was a good waveform with an O2 saturation in the high 90s.  At this time there is no evidence of airway compromise or indication for intubation.  We will obtain lab workup and consult ICU for admission.  ----------------------------------------- 9:31 AM on 08/26/2023 -----------------------------------------  MAP remains above 65 although the patient is still borderline hypotensive.  Rate has improved to between 105-130.  The patient remains alert and denies any pain or other acute complaints.  Lab workup is so far significant for hypernatremia 149, leukocytosis of 14, and anemia with hemoglobin of 9.  Troponin is only minimally elevated at 24.  BNP is slightly elevated.  CK is normal.   Lactate is still pending.  The patient received a total of 1500 mL fluids here and additional liter by EMS.  I will hold the fluids here in order to prevent pulmonary edema and fluid overload since her blood pressure has improved.  I consulted and discussed the case with Dr. Belia Heman from the ICU, who agrees to evaluate the patient for admission.  I have also reached out to cardiology and am awaiting callback.  I also got additional history from the patient's daughter over the phone; the daughter states that the patient called a family member early this morning around 4 or 5 AM saying she had chest pain.  This is what prompted someone to go check on her.  The daughter states that the patient does not have any advanced directives at this time.  ----------------------------------------- 10:16 AM on 08/26/2023 -----------------------------------------  I consulted and discussed the case with Dr. Corky Sing from cardiology who agrees with the current management and plan for ICU admission.  He does not feel there is an indication for emergent cardiac catheterization.  ----------------------------------------- 11:00 AM on 08/26/2023 -----------------------------------------  Patient has been admitted to the ICU service under Dr. Belia Heman.  FINAL CLINICAL IMPRESSION(S) / ED DIAGNOSES   Final diagnoses:  Atrial fibrillation with RVR (HCC)  Shock (HCC)  Hypotension, unspecified hypotension type     Rx / DC Orders   ED Discharge Orders     None        Note:  This document was prepared using Dragon voice recognition software and may include unintentional dictation errors.    Dionne Bucy, MD 08/26/23 1102

## 2023-08-26 NOTE — Progress Notes (Signed)
*  PRELIMINARY RESULTS* Echocardiogram 2D Echocardiogram has been performed.  Robin Graves 08/26/2023, 2:40 PM

## 2023-08-26 NOTE — H&P (Signed)
 NAME:  Robin Graves, MRN:  [id number], DOB:  09/05/1940, LOS: 0 ADMISSION DATE:  08/26/2023  CHIEF COMPLAINT:  acute afib with RVR     History of Present Illness:  83 y.o. female with a history of COPD, CAD, diastolic CHF, CVA, hypothyroidism, GERD, and dementia who presents with hypotension and atrial fibrillation.     Per EMS, the patient's family or friends had not heard from her in a day or 2 and when someone went to check on her she was found down.    EMS found that her blood pressure was 64/36 and she was in atrial fibrillation with RVR with a rate in the 170s.   The patient reported chest pain.   EMS gave a dose of diltiazem with an improvement in her heart rate to the 140s.   On ED arrival the patient is alert and able to say her name.   She states the chest pain is better and denies feeling short of breath.   She states she feels tired.   The patient was previously admitted to the hospitalist service briefly last week with esophageal pain thought to be due to esophagitis.  She was previously admitted for influenza earlier this month.   ER In ED she was cardioverted 4 times and started on IV amiodarone.   Workup in the ED notable for creatinine 1.34, potassium 2.9, sodium 149, calcium 78, hemoglobin 9.0, WBC 14.5.   Lactic acid initially elevated at 2.6 and improved to 1.9.  BNP slightly elevated at 402.  Troponins trended 24  > 159.   She was also started on phenylephrine for BP support in the ED as well as IV antibiotics.    PCCM consulted for admission, I then advised for cardiology consultation    Significant Hospital Events: Including procedures, antibiotic start and stop dates in addition to other pertinent events   Admitted for cardiogenic shock and afib with RVR unstable rhythm shocked 4 times    Antimicrobials:   Antibiotics Given (last 72 hours)     Date/Time Action Medication Dose Rate   08/26/23 0857 New Bag/Given   ceFEPIme (MAXIPIME) 2 g in sodium  chloride 0.9 % 100 mL IVPB 2 g 200 mL/hr   08/26/23 0955 New Bag/Given   metroNIDAZOLE (FLAGYL) IVPB 500 mg 500 mg 100 mL/hr   08/26/23 1100 New Bag/Given   vancomycin (VANCOCIN) IVPB 1000 mg/200 mL premix 1,000 mg 200 mL/hr           Objective   Blood pressure 103/83, pulse (!) 104, temperature 98.2 F (36.8 C), temperature source Oral, resp. rate (!) 23, height 4\' 9"  (1.448 m), weight 37.8 kg, SpO2 99%.        Intake/Output Summary (Last 24 hours) at 08/26/2023 1604 Last data filed at 08/26/2023 1428 Gross per 24 hour  Intake 475.02 ml  Output --  Net 475.02 ml   Filed Weights   08/26/23 1554  Weight: 37.8 kg       Review of Systems: Gen:  Denies  fever, sweats, chills weight loss  HEENT: Denies blurred vision, double vision, ear pain, eye pain, hearing loss, nose bleeds, sore throat Cardiac:  No dizziness, chest pain or heaviness, chest tightness,edema, No JVD Resp:   No cough, -sputum production, -shortness of breath,-wheezing, -hemoptysis,  Other:  All other systems negative   Physical Examination:   General Appearance: No distress  EYES PERRLA, EOM intact.   NECK Supple, No JVD Pulmonary: normal breath sounds, No wheezing.  CardiovascularNormal S1,S2.  No m/r/g.   Abdomen: Benign, Soft, non-tender. Neurology UE/LE 5/5 strength, no focal deficits Ext pulses intact, cap refill intact ALL OTHER ROS ARE NEGATIVE    Labs/imaging that I havepersonally reviewed  (right click and "Reselect all SmartList Selections" daily)     ASSESSMENT AND PLAN  CARDIOGENIC SHOCK WITH AFIB WITH RVR/NSTEMI USE PRESSORS MAP GAOL 65  CONTINUE AMIO FOLLOW UP CARDIOLOGY RECS STARTED ON HEPARIN INFUSION   CARDIAC ICU monitoring   RENAL -continue Foley Catheter-assess need -Avoid nephrotoxic agents -Follow urine output, BMP -Ensure adequate renal perfusion, optimize oxygenation -Renal dose medications   Intake/Output Summary (Last 24 hours) at 08/26/2023  1604 Last data filed at 08/26/2023 1428 Gross per 24 hour  Intake 475.02 ml  Output --  Net 475.02 ml    ENDO - ICU hypoglycemic\Hyperglycemia protocol -check FSBS per protocol   GI GI PROPHYLAXIS as indicated  NUTRITIONAL STATUS DIET--> as tolerated Constipation protocol as indicated   ELECTROLYTES -follow labs as needed -replace as needed -pharmacy consultation and following    Best practice (right click and "Reselect all SmartList Selections" daily)   DVT prophylaxis: Systemic AC Mobility:  bed rest  Code Status:  FULL Disposition:ICU  Labs   CBC: Recent Labs  Lab 08/22/23 1329 08/23/23 0446 08/26/23 0704  WBC 23.5* 20.9* 14.5*  NEUTROABS  --   --  13.2*  HGB 11.9* 10.8* 9.0*  HCT 36.2 32.9* 28.1*  MCV 94.5 94.3 98.3  PLT 196 159 149*    Basic Metabolic Panel: Recent Labs  Lab 08/22/23 1329 08/23/23 0446 08/26/23 0704  NA 144 144 149*  K 3.7 3.0* 2.9*  CL 111 114* 120*  CO2 20* 23 20*  GLUCOSE 142* 88 93  BUN 36* 33* 55*  CREATININE 1.05* 0.86 1.34*  CALCIUM 9.6 9.0 7.8*  MG  --   --  2.2   GFR: Estimated Creatinine Clearance: 19 mL/min (A) (by C-G formula based on SCr of 1.34 mg/dL (H)). Recent Labs  Lab 08/22/23 1329 08/22/23 1651 08/22/23 1801 08/23/23 0446 08/26/23 0704 08/26/23 1032  PROCALCITON 0.28  --   --   --   --   --   WBC 23.5*  --   --  20.9* 14.5*  --   LATICACIDVEN  --  2.1* 1.4  --  2.6* 1.9    Liver Function Tests: Recent Labs  Lab 08/26/23 0704  AST 31  ALT 27  ALKPHOS 93  BILITOT 0.8  PROT 5.3*  ALBUMIN 1.8*   No results for input(s): "LIPASE", "AMYLASE" in the last 168 hours. No results for input(s): "AMMONIA" in the last 168 hours.  ABG    Component Value Date/Time   HCO3 17.2 (L) 08/26/2023 0703   ACIDBASEDEF 10.1 (H) 08/26/2023 0703   O2SAT 37.4 08/26/2023 0703     Coagulation Profile: Recent Labs  Lab 08/26/23 1140  INR 1.6*    Cardiac Enzymes: Recent Labs  Lab 08/26/23 0704   CKTOTAL 62    HbA1C: No results found for: "HGBA1C"  CBG: No results for input(s): "GLUCAP" in the last 168 hours.   Past Medical History:  She,  has a past medical history of Anemia, Arthritis, Chicken pox, Colon polyps, COPD (chronic obstructive pulmonary disease) (HCC), Diverticulosis, Emphysema of lung (HCC), GERD (gastroesophageal reflux disease), Graves disease, Graves' disease with exophthalmos, Headache, HH (hiatus hernia), Hyperlipidemia, Pernicious anemia, Stroke (HCC), and Wears dentures.   Surgical History:   Past Surgical History:  Procedure Laterality Date  ABDOMINAL HYSTERECTOMY  1974   AUGMENTATION MAMMAPLASTY Bilateral 1976   silicone   AUGMENTATION MAMMAPLASTY Bilateral 1996   saline   BACK SURGERY  JUNE,4,2015   HURT AT WORK   BREAST SURGERY Bilateral , 1973   history of leakage   CATARACT EXTRACTION W/PHACO Right 04/20/2015   Procedure: CATARACT EXTRACTION PHACO AND INTRAOCULAR LENS PLACEMENT (IOC);  Surgeon: Lockie Mola, MD;  Location: [facility];  Service: Ophthalmology;  Laterality: Right;   CATARACT EXTRACTION W/PHACO Left 01/23/2017   Procedure: CATARACT EXTRACTION PHACO AND INTRAOCULAR LENS PLACEMENT (IOC)  Left;  Surgeon: Lockie Mola, MD;  Location: [facility];  Service: Ophthalmology;  Laterality: Left;   CHOLECYSTECTOMY     COLONOSCOPY WITH PROPOFOL N/A 05/16/2015   Procedure: COLONOSCOPY WITH PROPOFOL;  Surgeon: Elnita Maxwell, MD;  Location: [facility] END[facility];  Service: Endoscopy;  Laterality: N/A;   ESOPHAGOGASTRODUODENOSCOPY (EGD) WITH PROPOFOL N/A 05/16/2015   Procedure: ESOPHAGOGASTRODUODENOSCOPY (EGD) WITH PROPOFOL;  Surgeon: Elnita Maxwell, MD;  Location: [facility] ENDOSCOPY;  Service: Endoscopy;  Laterality: N/A;   EYE SURGERY     KYPHOPLASTY  12/10/2013   T12   KYPHOPLASTY N/A 11/08/2016   Procedure: KYPHOPLASTY- T9;  Surgeon: Kennedy Bucker, MD;  Location: [facility] ORS;  Service: Orthopedics;  Laterality: N/A;    MASTECTOMY Bilateral 1970's   subcutaneous mastectomy done for preventative reasons with reconstruction   thyroid ablation       Social History:   reports that she quit smoking about 15 years ago. Her smoking use included cigarettes. She started smoking about 19 years ago. She has a 1 pack-year smoking history. She has never used smokeless tobacco. She reports that she does not drink alcohol and does not use drugs.   Family History:  Her family history includes Cancer in her brother; Coronary artery disease in her mother; Diabetes in her brother; Heart attack in her brother and father; Stroke in her brother and father.   Allergies Allergies  Allergen Reactions   Codeine     unknown     Home Medications  Prior to Admission medications   Medication Sig Start Date End Date Taking? Authorizing Provider  albuterol (VENTOLIN HFA) 108 (90 Base) MCG/ACT inhaler Inhale 2 puffs into the lungs every 6 (six) hours as needed for wheezing or shortness of breath. 08/15/23  Yes Esaw Grandchild A, DO  aspirin EC 81 MG tablet Take 81 mg by mouth daily. AM   Yes [provider]  cyanocobalamin (,VITAMIN B-12,) 1000 MCG/ML injection INJECT 1 ML AS DIRECTED MONTHLY 10/03/15  Yes Crissman, [name], MD  donepezil (ARICEPT) 5 MG tablet Take 5 mg by mouth in the morning. 07/16/23 07/15/24 Yes [provider]  feeding supplement (ENSURE ENLIVE / ENSURE PLUS) LIQD Take 237 mLs by mouth 2 (two) times daily between meals. 08/24/23  Yes Darlin Priestly, MD  ferrous sulfate 325 (65 FE) MG tablet Take 1 tablet (325 mg total) by mouth daily with breakfast. 06/30/15  Yes Crissman, [name], MD  ipratropium-albuterol (DUONEB) 0.5-2.5 (3) MG/3ML SOLN Take 3 mLs by nebulization every 6 (six) hours as needed. 08/23/23  Yes Darlin Priestly, MD  levothyroxine (SYNTHROID) 50 MCG tablet Take 50 mcg by mouth daily before breakfast. Monday- Friday Patient takes 50 mcg 07/12/23  Yes [provider]  levothyroxine (SYNTHROID,  LEVOTHROID) 75 MCG tablet Take 150 mcg by mouth daily before breakfast. Saturday-Sunday Patient takes 150 mcg 10/08/16  Yes [provider]  pantoprazole (PROTONIX) 40 MG tablet Take 1 tablet (  40 mg total) by mouth 2 (two) times daily. 08/23/23  Yes Darlin Priestly, MD  potassium chloride (K-DUR,KLOR-CON) 10 MEQ tablet Take 10 mEq by mouth 2 (two) times daily.   Yes [provider]  pravastatin (PRAVACHOL) 80 MG tablet Take 1 tablet (80 mg total) by mouth daily. 06/30/15  Yes Crissman, [name], MD  Multiple Vitamin (MULTIVITAMIN WITH MINERALS) TABS tablet Take 1 tablet by mouth daily. Patient not taking: Reported on 08/26/2023 08/24/23   Darlin Priestly, MD  Polyethyl Glycol-Propyl Glycol (SYSTANE OP) Apply 1 drop to eye daily. Patient not taking: Reported on 08/26/2023    [provider]  polyethylene glycol (MIRALAX / GLYCOLAX) packet Take 17 g by mouth daily as needed for mild constipation or moderate constipation. Patient not taking: Reported on 08/26/2023    [provider]      Critical Care Time devoted to patient care services described in this note is 65 minutes.   Critical care was necessary to treat /prevent imminent and life-threatening deterioration.   Lucie Leather, M.D.  [facility]  Medical Director [facility] Medical Director [facility] Cardio-Pulmonary Department

## 2023-08-26 NOTE — ED Notes (Signed)
 Patient received 200J cardiovert

## 2023-08-26 NOTE — Plan of Care (Signed)
   Problem: Clinical Measurements: Goal: Ability to maintain clinical measurements within normal limits will improve Outcome: Progressing Goal: Diagnostic test results will improve Outcome: Progressing Goal: Respiratory complications will improve Outcome: Progressing Goal: Cardiovascular complication will be avoided Outcome: Progressing

## 2023-08-26 NOTE — Consult Note (Signed)
 PHARMACY - ANTICOAGULATION CONSULT NOTE  Pharmacy Consult for IV Heparin Indication: atrial fibrillation  Patient Measurements: Height: 4\' 9"  (144.8 cm) Weight: 37.8 kg (83 lb 5.3 oz) IBW/kg (Calculated) : 38.6 Heparin Dosing Weight: 42.1 kg  Labs: Recent Labs    08/26/23 0704 08/26/23 1032 08/26/23 1140 08/26/23 1410 08/26/23 1613 08/26/23 1742 08/26/23 1950  HGB 9.0*  --   --   --   --   --   --   HCT 28.1*  --   --   --   --   --   --   PLT 149*  --   --   --   --   --   --   APTT  --   --  40*  --   --   --   --   LABPROT  --   --  19.6*  --   --   --   --   INR  --   --  1.6*  --   --   --   --   HEPARINUNFRC  --   --   --   --   --   --  0.27*  CREATININE 1.34*  --   --   --   --   --   --   CKTOTAL 62  --   --   --   --   --   --   TROPONINIHS 24*   < >  --  540* 649* 763*  --    < > = values in this interval not displayed.    Estimated Creatinine Clearance: 19 mL/min (A) (by C-G formula based on SCr of 1.34 mg/dL (H)).   Medical History: Past Medical History:  Diagnosis Date   Anemia    Arthritis    BACK   Chicken pox    Colon polyps    COPD (chronic obstructive pulmonary disease) (HCC)    Diverticulosis    Emphysema of lung (HCC)    GERD (gastroesophageal reflux disease)    Graves disease    Graves' disease with exophthalmos    REMOVED ON THYROID MEDS   Headache    MIGRAINES, 1 EVERY DAY OR LESS OFTEN   HH (hiatus hernia)    Hyperlipidemia    Pernicious anemia    Stroke (HCC)    MINI STROKES 1995 A FEW BEFORE 1995   Wears dentures    UPPER AND LOWER   Assessment: 83 y.o. female with a history of COPD, CAD, diastolic CHF, CVA, hypothyroidism, GERD, and dementia who presents with hypotension and new onset atrial fibrillation. CHADSVASc 7. Hgb trending down possibly hemodilutional. Pharmacy consulted to start heparin.   0217 1950 HL 0.27, subthera; 600 un/hr  Goal of Therapy:  Heparin level 0.3-0.7 units/ml Monitor platelets by  anticoagulation protocol: Yes   Plan:  --Heparin 500 unit IV bolus and increase heparin infusion rate to 700 un/hr --Re-check HL 8 hours from rate change --Daily CBC per protocol while on IV heparin  Tressie Ellis 08/26/2023,9:01 PM

## 2023-08-26 NOTE — Consult Note (Signed)
 PHARMACY CONSULT NOTE - FOLLOW UP  Pharmacy Consult for Electrolyte Monitoring and Replacement   Recent Labs: Potassium (mmol/L)  Date Value  08/26/2023 2.9 (L)  12/11/2012 4.8   Magnesium (mg/dL)  Date Value  16/04/9603 2.2   Calcium (mg/dL)  Date Value  54/03/8118 7.8 (L)   Calcium, Total (mg/dL)  Date Value  14/78/2956 8.1 (L)   Albumin (g/dL)  Date Value  21/30/8657 1.8 (L)  12/10/2012 3.3 (L)   Sodium (mmol/L)  Date Value  08/26/2023 149 (H)  01/06/2015 138  12/11/2012 138     Assessment: 83 y.o. female with a history of COPD, CAD, diastolic CHF, CVA, hypothyroidism, GERD, and dementia who presents with hypotension and atrial fibrillation. Started on amio gtt. Scr is elevated.   Goal of Therapy:  K+ > 4 Mg > 2  Plan:  Pt only has two IV lines and heparin and amiodarine are running. Will replace PO KCL 40 mEq x 3 F/u with AM labs.   Ronnald Ramp ,PharmD Clinical Pharmacist 08/26/2023 11:12 AM

## 2023-08-26 NOTE — ED Notes (Signed)
 Lab to come draw pt labs

## 2023-08-26 NOTE — Plan of Care (Signed)
 Consult noted for GOC. Patient currently off unit. Will rattempt at another time.

## 2023-08-26 NOTE — ED Notes (Signed)
 Cardioverted with 120 jewels.

## 2023-08-26 NOTE — ED Notes (Signed)
2 versed given  

## 2023-08-26 NOTE — Consult Note (Signed)
 [facility] CLINIC CARDIOLOGY CONSULT NOTE       Patient ID: Robin Graves MRN: [id number] DOB/AGE: 1940-11-09 83 y.o.  Admit date: 08/26/2023 Referring Physician Dr. Dionne Bucy Primary Physician Barbette Reichmann, MD  Primary Cardiologist Dr. Darrold Junker Reason for Consultation atrial fibrillation RVR  HPI: Robin Graves is a 83 y.o. female  with a past medical history of COPD, CAD, diastolic CHF, CVA, hypothyroidism, GERD, and dementia who presents with hypotension and atrial fibrillation who presented to the ED on 08/26/2023 for hypotension.  EMS was called to her home after family had not heard from her in a few days.  Found to be hypotensive and in A-fib RVR with heart rate in the 170s.  Cardiology was consulted for further evaluation.   EMS was called to go to check on patient after her family had not heard from her in a few days.  When they arrived they found her down her home and was significantly hypotensive with a systolic BP in the 60s.  She was noted to be tachycardic found to be in atrial fibrillation RVR on EKG.  In ED she was cardioverted 4 times and started on IV amiodarone.  Workup in the ED notable for creatinine 1.34, potassium 2.9, sodium 149, calcium 78, hemoglobin 9.0, WBC 14.5.  Lactic acid initially elevated at 2.6 and improved to 1.9.  BNP slightly elevated at 402.  Troponins trended 24  > 159.  She was also started on phenylephrine for BP support in the ED as well as IV antibiotics.  At the time of my evaluation this morning patient is resting comfortably in hospital bed.  States that she does not remember exactly what happened before EMS came to her house.  Denies any chest pain, palpitation symptoms.  Also denies any recent fever or chills, other acute illness symptoms.  States that she feels little better now that she is back in normal rhythm and her blood pressure has improved.  Review of systems complete and found to be negative unless listed above    Past Medical  History:  Diagnosis Date   Anemia    Arthritis    BACK   Chicken pox    Colon polyps    COPD (chronic obstructive pulmonary disease) (HCC)    Diverticulosis    Emphysema of lung (HCC)    GERD (gastroesophageal reflux disease)    Graves disease    Graves' disease with exophthalmos    REMOVED ON THYROID MEDS   Headache    MIGRAINES, 1 EVERY DAY OR LESS OFTEN   HH (hiatus hernia)    Hyperlipidemia    Pernicious anemia    Stroke (HCC)    MINI STROKES 1995 A FEW BEFORE 1995   Wears dentures    UPPER AND LOWER    Past Surgical History:  Procedure Laterality Date   ABDOMINAL HYSTERECTOMY  1974   AUGMENTATION MAMMAPLASTY Bilateral 1976   silicone   AUGMENTATION MAMMAPLASTY Bilateral 1996   saline   BACK SURGERY  JUNE,4,2015   HURT AT WORK   BREAST SURGERY Bilateral , 1973   history of leakage   CATARACT EXTRACTION W/PHACO Right 04/20/2015   Procedure: CATARACT EXTRACTION PHACO AND INTRAOCULAR LENS PLACEMENT (IOC);  Surgeon: Lockie Mola, MD;  Location: [facility];  Service: Ophthalmology;  Laterality: Right;   CATARACT EXTRACTION W/PHACO Left 01/23/2017   Procedure: CATARACT EXTRACTION PHACO AND INTRAOCULAR LENS PLACEMENT (IOC)  Left;  Surgeon: Lockie Mola, MD;  Location: [facility];  Service: Ophthalmology;  Laterality: Left;   CHOLECYSTECTOMY     COLONOSCOPY WITH PROPOFOL N/A 05/16/2015   Procedure: COLONOSCOPY WITH PROPOFOL;  Surgeon: Elnita Maxwell, MD;  Location: [facility] END[facility]COPY;  Service: Endoscopy;  Laterality: N/A;   ESOPHAGOGASTRODUODENOSCOPY (EGD) WITH PROPOFOL N/A 05/16/2015   Procedure: ESOPHAGOGASTRODUODENOSCOPY (EGD) WITH PROPOFOL;  Surgeon: Elnita Maxwell, MD;  Location: [facility] ENDOSCOPY;  Service: Endoscopy;  Laterality: N/A;   EYE SURGERY     KYPHOPLASTY  12/10/2013   T12   KYPHOPLASTY N/A 11/08/2016   Procedure: KYPHOPLASTY- T9;  Surgeon: Kennedy Bucker, MD;  Location: [facility] ORS;  Service: Orthopedics;  Laterality: N/A;    MASTECTOMY Bilateral 1970's   subcutaneous mastectomy done for preventative reasons with reconstruction   thyroid ablation      (Not in a hospital admission)  Social History   Socioeconomic History   Marital status: Widowed    Spouse name: Not on file   Number of children: Not on file   Years of education: Not on file   Highest education level: Not on file  Occupational History   Not on file  Tobacco Use   Smoking status: Former    Current packs/day: 0.00    Average packs/day: 0.3 packs/day for 4.0 years (1.0 ttl pk-yrs)    Types: Cigarettes    Start date: 04/23/2004    Quit date: 04/23/2008    Years since quitting: 15.3   Smokeless tobacco: Never   Tobacco comments:    was still having occ cig until 07/2016  Substance and Sexual Activity   Alcohol use: No   Drug use: No   Sexual activity: Not on file  Other Topics Concern   Not on file  Social History Narrative   Not on file   Social Drivers of Health   Financial Resource Strain: Low Risk  (08/21/2023)   Received from [facility]   Overall Financial Resource Strain (CARDIA)    Difficulty of Paying Living Expenses: Not hard at all  Food Insecurity: No Food Insecurity (08/23/2023)   Hunger Vital Sign    Worried About Running Out of Food in the Last Year: Never true    Ran Out of Food in the Last Year: Never true  Transportation Needs: No Transportation Needs (08/23/2023)   PRAPARE - Administrator, Civil Service (Medical): No    Lack of Transportation (Non-Medical): No  Physical Activity: Not on file  Stress: Not on file  Social Connections: Moderately Integrated (08/23/2023)   Social Connection and Isolation Panel [NHANES]    Frequency of Communication with Friends and Family: More than three times a week    Frequency of Social Gatherings with Friends and Family: Twice a week    Attends Religious Services: More than 4 times per year    Active Member of [employer] or Organizations: Yes     Attends Engineer, structural: More than 4 times per year    Marital Status: Never married  Intimate Partner Violence: Not At Risk (08/23/2023)   Humiliation, Afraid, Rape, and Kick questionnaire    Fear of Current or Ex-Partner: No    Emotionally Abused: No    Physically Abused: No    Sexually Abused: No    Family History  Problem Relation Age of Onset   Coronary artery disease Mother    Stroke Father    Heart attack Father    Cancer Brother        colon   Stroke Brother    Heart attack  Brother    Diabetes Brother      Vitals:   08/26/23 1400 08/26/23 1430 08/26/23 1500 08/26/23 1515  BP: (!) 120/92 125/66 110/78 107/64  Pulse: 97 86 94 (!) 104  Resp: (!) 27 (!) 23 (!) 21 17  Temp:   97.8 F (36.6 C)   TempSrc:   Oral   SpO2: 100% 100% 94% 100%    PHYSICAL EXAM General: Ill-appearing elderly female, well nourished, in no acute distress. HEENT: Normocephalic and atraumatic. Neck: No JVD.  Lungs: Normal respiratory effort on supplemental O2. Clear bilaterally to auscultation. No wheezes, crackles, rhonchi.  Heart: HRRR. Normal S1 and S2 without gallops or murmurs.  Abdomen: Non-distended appearing.  Msk: Normal strength and tone for age. Extremities: Warm and well perfused. No clubbing, cyanosis.  No edema.  Neuro: Alert and oriented X 3. Psych: Answers questions appropriately.   Labs: Basic Metabolic Panel: Recent Labs    08/26/23 0704  NA 149*  K 2.9*  CL 120*  CO2 20*  GLUCOSE 93  BUN 55*  CREATININE 1.34*  CALCIUM 7.8*  MG 2.2   Liver Function Tests: Recent Labs    08/26/23 0704  AST 31  ALT 27  ALKPHOS 93  BILITOT 0.8  PROT 5.3*  ALBUMIN 1.8*   No results for input(s): "LIPASE", "AMYLASE" in the last 72 hours. CBC: Recent Labs    08/26/23 0704  WBC 14.5*  NEUTROABS 13.2*  HGB 9.0*  HCT 28.1*  MCV 98.3  PLT 149*   Cardiac Enzymes: Recent Labs    08/26/23 0704 08/26/23 1032 08/26/23 1410  CKTOTAL 62  --   --    TROPONINIHS 24* 159* 540*   BNP: Recent Labs    08/26/23 0704  BNP 402.6*   D-Dimer: No results for input(s): "DDIMER" in the last 72 hours. Hemoglobin A1C: No results for input(s): "HGBA1C" in the last 72 hours. Fasting Lipid Panel: No results for input(s): "CHOL", "HDL", "LDLCALC", "TRIG", "CHOLHDL", "LDLDIRECT" in the last 72 hours. Thyroid Function Tests: No results for input(s): "TSH", "T4TOTAL", "T3FREE", "THYROIDAB" in the last 72 hours.  Invalid input(s): "FREET3" Anemia Panel: No results for input(s): "VITAMINB12", "FOLATE", "FERRITIN", "TIBC", "IRON", "RETICCTPCT" in the last 72 hours.   Radiology: [facility] Chest Port 1 View Result Date: 08/26/2023 CLINICAL DATA:  Atrial fibrillation.  Unresponsive. EXAM: PORTABLE CHEST 1 VIEW COMPARISON:  08/22/2023 FINDINGS: Marked thoracolumbar scoliosis. Cardiopericardial silhouette is at upper limits of normal for size. Diffuse interstitial and patchy/nodular bilateral airspace disease appears progressive in the interval, potentially secondary to edema given the relatively rapid progression. Diffuse infection not excluded. Nodular right parahilar density seen previously is again noted. Bones are diffusely demineralized. Telemetry leads overlie the chest. IMPRESSION: 1. Progressive diffuse interstitial and patchy/nodular bilateral airspace disease, potentially secondary to edema given the relatively rapid progression. Diffuse infection not excluded. Close follow-up recommended to ensure resolution. 2. Nodular right parahilar density seen previously is again noted. Electronically Signed   By: Kennith Center M.D.   On: 08/26/2023 08:11   DG Chest 2 View Result Date: 08/22/2023 CLINICAL DATA:  Increasing pain in mouth, esophagus, and stomach for 1 week, dysphagia EXAM: CHEST - 2 VIEW COMPARISON:  08/14/2023, 08/13/2023 FINDINGS: Frontal and lateral views of the chest demonstrate a stable cardiac silhouette. The right upper lobe mass seen on prior CT is  faintly apparent adjacent to the right hilum on the frontal view, concerning for malignancy based on previous CT imaging. The subcentimeter left upper lobe nodule seen on  prior CT is not well visualized by x-ray. Stable asymmetric right apical pleural thickening, likely benign scarring given long-term stability. No acute airspace disease, effusion, or pneumothorax. Stable thoracic compression deformities with prior vertebral augmentations. IMPRESSION: 1. Stable right upper lobe perihilar mass concerning for malignancy based on previous CT findings. Subcentimeter left upper lobe nodule seen on prior CT is not visualized by x-ray. PET scan again recommended if not performed in the interim. 2. No acute airspace disease. Electronically Signed   By: Sharlet Salina M.D.   On: 08/22/2023 15:34   CT Angio Chest PE W and/or Wo Contrast Result Date: 08/14/2023 CLINICAL DATA:  Shortness of breath, abdominal pain, positive flu test on 4 days ago. Also, PA and lateral chest yesterday demonstrated suspected mass in the retrosternal clear space on the lateral view only. EXAM: CT ANGIOGRAPHY CHEST WITH CONTRAST TECHNIQUE: Multidetector CT imaging of the chest was performed using the standard protocol during bolus administration of intravenous contrast. Multiplanar CT image reconstructions and MIPs were obtained to evaluate the vascular anatomy. RADIATION DOSE REDUCTION: This exam was performed according to the departmental dose-optimization program which includes automated exposure control, adjustment of the mA and/or kV according to patient size and/or use of iterative reconstruction technique. CONTRAST:  75mL OMNIPAQUE IOHEXOL 350 MG/ML SOLN COMPARISON:  PA and lateral chest yesterday, and chest, abdomen and pelvis CT with IV contrast 04/29/2020. FINDINGS: Cardiovascular: The cardiac size is normal. There is no pericardial effusion. Three-vessel coronary artery calcifications greatest in the LAD, additional scattered  calcification in the left main coronary artery. There is moderate calcification and thickening of the aortic valve leaflets. Consider echocardiographic evaluation. The aorta is normal in caliber with mild tortuosity and moderate calcific plaques, with atherosclerosis in the great vessels. Pulmonary arteries are normal in caliber without evidence of thromboemboli. The pulmonary veins are slightly distended but no more than previously. Mediastinum/Nodes: No enlarged mediastinal, hilar, or axillary lymph nodes. Thyroid gland, trachea, and esophagus demonstrate no significant findings. There is a small hiatal hernia. Lungs/Pleura: There is right-greater-than-left biapical pleural-parenchymal scarring, with right apical volume loss and upward hilar retraction and left apical linear calcifications within the scarring. On the right scarring changes merge with coarsely nodular chronic scar-like opacities. There are mild centrilobular emphysematous changes in the lungs. Anteriorly in the base of the right upper lobe there is new demonstration of a lobular solid nodule with pleural stranding measuring 2.1 x 2 x 1.8 cm (measured on 5:56 and 7:32). This is highly worrisome for a primary bronchogenic neoplasm. PET-CT or tissue sampling recommended. In the left upper lobe a new nodule is also noted on 5:34, with slight cavitation and measuring 8 x 6 mm. This could be a neoplasm or an inflammatory nodule. In the left lower lobe, there is a chronic stable 4 mm nodule on 5:97. There are scattered linear scar-like opacities in the bases. There is no consolidation, effusion or further nodules. Upper Abdomen: No acute abnormality. Status post cholecystectomy with chronically prominent common bile duct. Abdominal aortic atherosclerosis. Musculoskeletal: There are multilevel thoracic spine chronic compression fractures, with old kyphoplasty at T9 and 12. Osteopenia and degenerative change with thoracic kyphosis. No new or progressive  thoracic compression fractures. The ribcage is intact. No destructive bone lesion. Bilateral breast implants noted with chronic collapse of the left implant. Review of the MIP images confirms the above findings. IMPRESSION: 1. No evidence of arterial dilatation or thromboembolism. 2. 2.1 x 2 x 1.8 cm lobular solid nodule with pleural stranding in  the base of the right upper lobe anteriorly, highly worrisome for a primary bronchogenic neoplasm. PET-CT or tissue sampling recommended. 3. Additional new 8 x 6 mm cavitary nodule in the left upper lobe, could be a neoplasm or an inflammatory nodule. 4. Emphysema. 5. Aortic and coronary artery atherosclerosis. 6. Moderate calcification and thickening of the aortic valve leaflets. Consider echocardiographic evaluation. 7. Small hiatal hernia. 8. Osteopenia and degenerative change with multilevel thoracic spine chronic compression fractures, with old kyphoplasty at T9 and 12. 9. 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 Aortic Atherosclerosis (ICD10-I70.0) and Emphysema (ICD10-J43.9). Electronically Signed   By: Almira Bar M.D.   On: 08/14/2023 01:21   DG Chest 2 View Result Date: 08/13/2023 CLINICAL DATA:  Shortness of breath. EXAM: CHEST - 2 VIEW COMPARISON:  07/04/2013.  Chest CT dated 04/29/2020. FINDINGS: Enlarged cardiac silhouette. Hyperexpanded lungs. Interval nodular density overlying the anterior upper thorax on the lateral view, not seen on the frontal view. No significant change in right greater than left biapical pleural and parenchymal scarring. Multiple thoracic and lumbar compression deformities are again demonstrated with kyphoplasty at 2 levels. Diffuse osteopenia. Mild-to-moderate dextroconvex thoracolumbar scoliosis. Cholecystectomy clips. Atheromatous aortic calcifications. IMPRESSION: 1. Interval nodular density overlying the anterior upper thorax on the lateral view, not seen on the frontal view. Recommend chest CT for further evaluation. 2.  COPD. 3. Cardiomegaly. Electronically Signed   By: Beckie Salts M.D.   On: 08/13/2023 19:39    ECHO pending  TELEMETRY reviewed by me 08/26/2023: Sinus rhythm rate 100s  EKG reviewed by me: Atrial fibrillation rate 119 bpm  Data reviewed by me 08/26/2023: last 24h vitals tele labs imaging I/O ED provider note, admission H&P  Principal Problem:   Cardiogenic shock (HCC)    ASSESSMENT AND PLAN:  OLLA DELANCEY is a 83 y.o. female  with a past medical history of COPD, CAD, diastolic CHF, CVA, hypothyroidism, GERD, and dementia who presents with hypotension and atrial fibrillation who presented to the ED on 08/26/2023 for hypotension.  EMS was called to her home after family had not heard from her in a few days.  Found to be hypotensive and in A-fib RVR with heart rate in the 170s.  Cardiology was consulted for further evaluation.   # Atrial fibrillation RVR # NSTEMI, suspect type II # Shock, unclear etiology Patient found down in her home by EMS with significant hypotension and in atrial fibrillation RVR.  She was cardioverted x 4 in the ED and started on IV amiodarone, now maintaining in normal sinus rhythm.  Troponins uptrending from 24-159.  She is without chest pain or anginal symptoms, EKG without acute ST-T changes. -Continue heparin for anticoagulation.  Continue aspirin 81 mg daily. -Continue IV amiodarone.  Can consider transitioning to oral tomorrow. -Stat echo obtained. -Unclear etiology of shock at this time but suspect septic as BP did not significantly improve with conversion to normal sinus rhythm and she is still requiring phenylephrine for BP support. -No plan for further cardiac diagnostics at this time.  Further recommendations pending results of echocardiogram.   TIMI Risk Score for Unstable Angina or Non-ST Elevation MI:   The patient's TIMI risk score is 5, which indicates a 26% risk of all cause mortality, new or recurrent myocardial infarction or need for urgent  revascularization in the next 14 days.   This patient's plan of care was discussed and created with Dr. Corky Sing and he is in agreement.  SignedGale Journey, PA-C  08/26/2023, 3:29  PM [facility]

## 2023-08-27 DIAGNOSIS — R570 Cardiogenic shock: Secondary | ICD-10-CM

## 2023-08-27 DIAGNOSIS — Z7189 Other specified counseling: Secondary | ICD-10-CM

## 2023-08-27 LAB — CBC
HCT: 30.6 % — ABNORMAL LOW (ref 36.0–46.0)
Hemoglobin: 9.6 g/dL — ABNORMAL LOW (ref 12.0–15.0)
MCH: 30.6 pg (ref 26.0–34.0)
MCHC: 31.4 g/dL (ref 30.0–36.0)
MCV: 97.5 fL (ref 80.0–100.0)
Platelets: 137 10*3/uL — ABNORMAL LOW (ref 150–400)
RBC: 3.14 MIL/uL — ABNORMAL LOW (ref 3.87–5.11)
RDW: 14.6 % (ref 11.5–15.5)
WBC: 9.3 10*3/uL (ref 4.0–10.5)
nRBC: 0 % (ref 0.0–0.2)

## 2023-08-27 LAB — HEPARIN LEVEL (UNFRACTIONATED): Heparin Unfractionated: 0.45 [IU]/mL (ref 0.30–0.70)

## 2023-08-27 LAB — RENAL FUNCTION PANEL
Albumin: 1.9 g/dL — ABNORMAL LOW (ref 3.5–5.0)
Anion gap: 9 (ref 5–15)
BUN: 35 mg/dL — ABNORMAL HIGH (ref 8–23)
CO2: 16 mmol/L — ABNORMAL LOW (ref 22–32)
Calcium: 7.9 mg/dL — ABNORMAL LOW (ref 8.9–10.3)
Chloride: 121 mmol/L — ABNORMAL HIGH (ref 98–111)
Creatinine, Ser: 0.83 mg/dL (ref 0.44–1.00)
GFR, Estimated: >60 mL/min (ref >60)
Glucose, Bld: 67 mg/dL — ABNORMAL LOW (ref 70–99)
Phosphorus: 1.7 mg/dL — ABNORMAL LOW (ref 2.5–4.6)
Potassium: 3.8 mmol/L (ref 3.5–5.1)
Sodium: 146 mmol/L — ABNORMAL HIGH (ref 135–145)

## 2023-08-27 LAB — CULTURE, BLOOD (ROUTINE X 2)
Culture: NO GROWTH
Culture: NO GROWTH
Special Requests: ADEQUATE

## 2023-08-27 LAB — TROPONIN I (HIGH SENSITIVITY): Troponin I (High Sensitivity): 638 ng/L (ref <18)

## 2023-08-27 LAB — MAGNESIUM: Magnesium: 2 mg/dL (ref 1.7–2.4)

## 2023-08-27 LAB — GLUCOSE, CAPILLARY: Glucose-Capillary: 94 mg/dL (ref 70–99)

## 2023-08-27 MED ORDER — ENSURE ENLIVE PO LIQD
237.0000 mL | Freq: Two times a day (BID) | ORAL | Status: DC
  Administered 2023-08-28 – 2023-09-01 (×6): 237 mL via ORAL

## 2023-08-27 MED ORDER — LACTATED RINGERS IV BOLUS
500.0000 mL | Freq: Once | INTRAVENOUS | Status: AC
  Administered 2023-08-27: 500 mL via INTRAVENOUS

## 2023-08-27 MED ORDER — APIXABAN 2.5 MG PO TABS
2.5000 mg | ORAL_TABLET | Freq: Two times a day (BID) | ORAL | Status: DC
  Administered 2023-08-27 – 2023-09-01 (×11): 2.5 mg via ORAL
  Filled 2023-08-27 (×11): qty 1

## 2023-08-27 MED ORDER — AMIODARONE HCL 200 MG PO TABS
400.0000 mg | ORAL_TABLET | Freq: Two times a day (BID) | ORAL | Status: DC
  Administered 2023-08-27 – 2023-09-01 (×11): 400 mg via ORAL
  Filled 2023-08-27 (×11): qty 2

## 2023-08-27 MED ORDER — AMIODARONE HCL 200 MG PO TABS: 200.0000 mg | ORAL_TABLET | Freq: Every day | ORAL | Status: DC

## 2023-08-27 MED ORDER — BUDESONIDE 0.5 MG/2ML IN SUSP
0.5000 mg | Freq: Two times a day (BID) | RESPIRATORY_TRACT | Status: DC
  Administered 2023-08-27 – 2023-09-01 (×11): 0.5 mg via RESPIRATORY_TRACT
  Filled 2023-08-27 (×11): qty 2

## 2023-08-27 MED ORDER — IPRATROPIUM-ALBUTEROL 0.5-2.5 (3) MG/3ML IN SOLN
3.0000 mL | RESPIRATORY_TRACT | Status: DC | PRN
  Administered 2023-08-30: 3 mL via RESPIRATORY_TRACT

## 2023-08-27 MED ORDER — LEVOTHYROXINE SODIUM 50 MCG PO TABS
50.0000 ug | ORAL_TABLET | ORAL | Status: DC
  Administered 2023-08-28 – 2023-08-30 (×3): 50 ug via ORAL
  Filled 2023-08-27 (×3): qty 1

## 2023-08-27 MED ORDER — ADULT MULTIVITAMIN W/MINERALS CH
1.0000 | ORAL_TABLET | Freq: Every day | ORAL | Status: DC
  Administered 2023-08-28 – 2023-09-01 (×5): 1 via ORAL
  Filled 2023-08-27 (×5): qty 1

## 2023-08-27 MED ORDER — PANTOPRAZOLE SODIUM 40 MG IV SOLR
40.0000 mg | Freq: Every day | INTRAVENOUS | Status: DC
  Administered 2023-08-27 – 2023-08-28 (×2): 40 mg via INTRAVENOUS
  Filled 2023-08-27 (×2): qty 10

## 2023-08-27 MED ORDER — K PHOS MONO-SOD PHOS DI & MONO 155-852-130 MG PO TABS
500.0000 mg | ORAL_TABLET | Freq: Once | ORAL | Status: AC
  Administered 2023-08-27: 500 mg via ORAL
  Filled 2023-08-27: qty 2

## 2023-08-27 MED ORDER — LEVOTHYROXINE SODIUM 50 MCG PO TABS
75.0000 ug | ORAL_TABLET | ORAL | Status: DC
  Administered 2023-08-31 – 2023-09-01 (×2): 75 ug via ORAL
  Filled 2023-08-27 (×2): qty 2

## 2023-08-27 MED ORDER — REVEFENACIN 175 MCG/3ML IN SOLN
175.0000 ug | Freq: Every day | RESPIRATORY_TRACT | Status: DC
  Administered 2023-08-27 – 2023-09-01 (×5): 175 ug via RESPIRATORY_TRACT
  Filled 2023-08-27 (×6): qty 3

## 2023-08-27 NOTE — Consult Note (Addendum)
 PHARMACY CONSULT NOTE - FOLLOW UP  Pharmacy Consult for Electrolyte Monitoring and Replacement   Recent Labs: Potassium (mmol/L)  Date Value  08/27/2023 3.8  12/11/2012 4.8   Magnesium (mg/dL)  Date Value  45/40/9811 2.0   Calcium (mg/dL)  Date Value  91/47/8295 7.9 (L)   Calcium, Total (mg/dL)  Date Value  62/13/0865 8.1 (L)   Albumin (g/dL)  Date Value  78/46/9629 1.9 (L)  12/10/2012 3.3 (L)   Phosphorus (mg/dL)  Date Value  52/84/1324 1.7 (L)   Sodium (mmol/L)  Date Value  08/27/2023 146 (H)  01/06/2015 138  12/11/2012 138   Assessment: 83 y.o. female with a history of COPD, CAD, diastolic CHF, CVA, hypothyroidism, GERD, and dementia who presents with hypotension and atrial fibrillation. Started on amio gtt. Scr is elevated. Pharmacy was consulted to monitor and replace this patient's electrolytes while they're in the ICU.   Goal of Therapy:  K+ > 4 Mg > 2 Today, 08/27/2023 K = 3.8 Mg = 2.0 Phos = 1.7  Plan:  Replace phos with K-Phos Neutral 500 mg PO x 1 No other replacement indicated at this time  Continue to monitor with AM labs   Effie Shy, PharmD Pharmacy Resident  08/27/2023 7:08 AM

## 2023-08-27 NOTE — Progress Notes (Signed)
 NAME:  Robin Graves, MRN:  [id number], DOB:  03/10/1941, LOS: 1 ADMISSION DATE:  08/26/2023  CHIEF COMPLAINT:  acute afib with RVR     History of Present Illness:  83 y.o. female with a history of COPD, CAD, diastolic CHF, CVA, hypothyroidism, GERD, and dementia who presents with hypotension and atrial fibrillation.     Per EMS, the patient's family or friends had not heard from her in a day or 2 and when someone went to check on her she was found down.    EMS found that her blood pressure was 64/36 and she was in atrial fibrillation with RVR with a rate in the 170s.   The patient reported chest pain.   EMS gave a dose of diltiazem with an improvement in her heart rate to the 140s.   On ED arrival the patient is alert and able to say her name.   She states the chest pain is better and denies feeling short of breath.   She states she feels tired.   The patient was previously admitted to the hospitalist service briefly last week with esophageal pain thought to be due to esophagitis.  She was previously admitted for influenza earlier this month.   ER In ED she was cardioverted 4 times and started on IV amiodarone.   Workup in the ED notable for creatinine 1.34, potassium 2.9, sodium 149, calcium 78, hemoglobin 9.0, WBC 14.5.   Lactic acid initially elevated at 2.6 and improved to 1.9.  BNP slightly elevated at 402.  Troponins trended 24  > 159.   She was also started on phenylephrine for BP support in the ED as well as IV antibiotics.    PCCM consulted for admission, I then advised for cardiology consultation    Significant Hospital Events: Including procedures, antibiotic start and stop dates in addition to other pertinent events   Admitted for cardiogenic shock and afib with RVR unstable rhythm shocked 4 times 2/18 weaned of pressors, alert and awake, follows commands    Antimicrobials:   Antibiotics Given (last 72 hours)     Date/Time Action Medication Dose Rate    08/26/23 0857 New Bag/Given   ceFEPIme (MAXIPIME) 2 g in sodium chloride 0.9 % 100 mL IVPB 2 g 200 mL/hr   08/26/23 0955 New Bag/Given   metroNIDAZOLE (FLAGYL) IVPB 500 mg 500 mg 100 mL/hr   08/26/23 1100 New Bag/Given   vancomycin (VANCOCIN) IVPB 1000 mg/200 mL premix 1,000 mg 200 mL/hr           Objective   Blood pressure (!) 148/63, pulse 87, temperature 97.6 F (36.4 C), temperature source Oral, resp. rate 20, height 4\' 9"  (1.448 m), weight 39.7 kg, SpO2 93%.        Intake/Output Summary (Last 24 hours) at 08/27/2023 1108 Last data filed at 08/27/2023 1000 Gross per 24 hour  Intake 2252.06 ml  Output 250 ml  Net 2002.06 ml   Filed Weights   08/26/23 1554 08/27/23 0409  Weight: 37.8 kg 39.7 kg       Review of Systems: Gen:  Denies  fever, sweats, chills weight loss  Thin frail cachexia HEENT: Denies blurred vision, double vision, ear pain, eye pain, hearing loss, nose bleeds, sore throat Cardiac:  No dizziness, chest pain or heaviness, chest tightness,edema, No JVD Resp:   No cough, -sputum production, -shortness of breath,-wheezing, -hemoptysis,  Other:  All other systems negative   Physical Examination:   General Appearance: No distress  EYES  PERRLA, EOM intact.   NECK Supple, No JVD Pulmonary: normal breath sounds, No wheezing.  CardiovascularNormal S1,S2.  No m/r/g.   Abdomen: Benign, Soft, non-tender. Neurology UE/LE 5/5 strength, no focal deficits Ext pulses intact, cap refill intact ALL OTHER ROS ARE NEGATIVE     Labs/imaging that I havepersonally reviewed  (right click and "Reselect all SmartList Selections" daily)     ASSESSMENT AND PLAN  CARDIOGENIC SHOCK WITH AFIB WITH RVR/NSTEMI-RESOLVED CONTINUE AMIO SWITCHED TO ORAL FOLLOW UP CARDIOLOGY RECS AC SWITCHED TO ORAL   CARDIAC ICU monitoring   RENAL -continue Foley Catheter-assess need -Avoid nephrotoxic agents -Follow urine output, BMP -Ensure adequate renal perfusion, optimize  oxygenation -Renal dose medications   Intake/Output Summary (Last 24 hours) at 08/27/2023 1108 Last data filed at 08/27/2023 1000 Gross per 24 hour  Intake 2252.06 ml  Output 250 ml  Net 2002.06 ml     ENDO - ICU hypoglycemic\Hyperglycemia protocol -check FSBS per protocol   GI GI PROPHYLAXIS as indicated NUTRITIONAL STATUS DIET--> as tolerated Constipation protocol as indicated   ELECTROLYTES -follow labs as needed -replace as needed -pharmacy consultation and following  RESTRICTIVE TRANSFUSION PROTOCOL TRANSFUSION  IF HGB<7  or ACTIVE BLEEDING OR DX of ACUTE CORONARY SYNDROMES   SD statu and transfer to floor/TRH   PALLIATIVE CARE TEAM CONSULTED  Best practice (right click and "Reselect all SmartList Selections" daily)   DVT prophylaxis: Systemic AC Mobility:  bed rest  Code Status:  FULL Disposition:ICU  Labs   CBC: Recent Labs  Lab 08/22/23 1329 08/23/23 0446 08/26/23 0704 08/27/23 0450  WBC 23.5* 20.9* 14.5* 9.3  NEUTROABS  --   --  13.2*  --   HGB 11.9* 10.8* 9.0* 9.6*  HCT 36.2 32.9* 28.1* 30.6*  MCV 94.5 94.3 98.3 97.5  PLT 196 159 149* 137*    Basic Metabolic Panel: Recent Labs  Lab 08/22/23 1329 08/23/23 0446 08/26/23 0704 08/27/23 0450  NA 144 144 149* 146*  K 3.7 3.0* 2.9* 3.8  CL 111 114* 120* 121*  CO2 20* 23 20* 16*  GLUCOSE 142* 88 93 67*  BUN 36* 33* 55* 35*  CREATININE 1.05* 0.86 1.34* 0.83  CALCIUM 9.6 9.0 7.8* 7.9*  MG  --   --  2.2 2.0  PHOS  --   --   --  1.7*   GFR: Estimated Creatinine Clearance: 31.3 mL/min (by C-G formula based on SCr of 0.83 mg/dL). Recent Labs  Lab 08/22/23 1329 08/22/23 1651 08/22/23 1801 08/23/23 0446 08/26/23 0704 08/26/23 1032 08/27/23 0450  PROCALCITON 0.28  --   --   --   --   --   --   WBC 23.5*  --   --  20.9* 14.5*  --  9.3  LATICACIDVEN  --  2.1* 1.4  --  2.6* 1.9  --     Liver Function Tests: Recent Labs  Lab 08/26/23 0704 08/27/23 0450  AST 31  --   ALT 27  --    ALKPHOS 93  --   BILITOT 0.8  --   PROT 5.3*  --   ALBUMIN 1.8* 1.9*   No results for input(s): "LIPASE", "AMYLASE" in the last 168 hours. No results for input(s): "AMMONIA" in the last 168 hours.  ABG    Component Value Date/Time   HCO3 17.2 (L) 08/26/2023 0703   ACIDBASEDEF 10.1 (H) 08/26/2023 0703   O2SAT 37.4 08/26/2023 0703     Coagulation Profile: Recent Labs  Lab 08/26/23 1140  INR  1.6*    Cardiac Enzymes: Recent Labs  Lab 08/26/23 0704  CKTOTAL 62    HbA1C: No results found for: "HGBA1C"  CBG: Recent Labs  Lab 08/26/23 1552  GLUCAP 94     Past Medical History:  She,  has a past medical history of Anemia, Arthritis, Chicken pox, Colon polyps, COPD (chronic obstructive pulmonary disease) (HCC), Diverticulosis, Emphysema of lung (HCC), GERD (gastroesophageal reflux disease), Graves disease, Graves' disease with exophthalmos, Headache, HH (hiatus hernia), Hyperlipidemia, Pernicious anemia, Stroke (HCC), and Wears dentures.   Surgical History:   Past Surgical History:  Procedure Laterality Date   ABDOMINAL HYSTERECTOMY  1974   AUGMENTATION MAMMAPLASTY Bilateral 1976   silicone   AUGMENTATION MAMMAPLASTY Bilateral 1996   saline   BACK SURGERY  JUNE,4,2015   HURT AT WORK   BREAST SURGERY Bilateral , 1973   history of leakage   CATARACT EXTRACTION W/PHACO Right 04/20/2015   Procedure: CATARACT EXTRACTION PHACO AND INTRAOCULAR LENS PLACEMENT (IOC);  Surgeon: Lockie Mola, MD;  Location: [facility];  Service: Ophthalmology;  Laterality: Right;   CATARACT EXTRACTION W/PHACO Left 01/23/2017   Procedure: CATARACT EXTRACTION PHACO AND INTRAOCULAR LENS PLACEMENT (IOC)  Left;  Surgeon: Lockie Mola, MD;  Location: [facility];  Service: Ophthalmology;  Laterality: Left;   CHOLECYSTECTOMY     COLONOSCOPY WITH PROPOFOL N/A 05/16/2015   Procedure: COLONOSCOPY WITH PROPOFOL;  Surgeon: Elnita Maxwell, MD;  Location: [facility]  ENDOSCOPY;  Service: Endoscopy;  Laterality: N/A;   ESOPHAGOGASTRODUODENOSCOPY (EGD) WITH PROPOFOL N/A 05/16/2015   Procedure: ESOPHAGOGASTRODUODENOSCOPY (EGD) WITH PROPOFOL;  Surgeon: Elnita Maxwell, MD;  Location: [facility] ENDOSCOPY;  Service: Endoscopy;  Laterality: N/A;   EYE SURGERY     KYPHOPLASTY  12/10/2013   T12   KYPHOPLASTY N/A 11/08/2016   Procedure: KYPHOPLASTY- T9;  Surgeon: Kennedy Bucker, MD;  Location: [facility] ORS;  Service: Orthopedics;  Laterality: N/A;   MASTECTOMY Bilateral 1970's   subcutaneous mastectomy done for preventative reasons with reconstruction   thyroid ablation       Social History:   reports that she quit smoking about 15 years ago. Her smoking use included cigarettes. She started smoking about 19 years ago. She has a 1 pack-year smoking history. She has never used smokeless tobacco. She reports that she does not drink alcohol and does not use drugs.   Family History:  Her family history includes Cancer in her brother; Coronary artery disease in her mother; Diabetes in her brother; Heart attack in her brother and father; Stroke in her brother and father.   Allergies Allergies  Allergen Reactions   Codeine     unknown     Home Medications  Prior to Admission medications   Medication Sig Start Date End Date Taking? Authorizing Provider  albuterol (VENTOLIN HFA) 108 (90 Base) MCG/ACT inhaler Inhale 2 puffs into the lungs every 6 (six) hours as needed for wheezing or shortness of breath. 08/15/23  Yes Esaw Grandchild A, DO  aspirin EC 81 MG tablet Take 81 mg by mouth daily. AM   Yes [provider]  cyanocobalamin (,VITAMIN B-12,) 1000 MCG/ML injection INJECT 1 ML AS DIRECTED MONTHLY 10/03/15  Yes Crissman, [name], MD  donepezil (ARICEPT) 5 MG tablet Take 5 mg by mouth in the morning. 07/16/23 07/15/24 Yes [provider]  feeding supplement (ENSURE ENLIVE / ENSURE PLUS) LIQD Take 237 mLs by mouth 2 (two) times daily between meals. 08/24/23  Yes Darlin Priestly, MD  ferrous sulfate 325 (65 FE)  MG tablet Take 1 tablet (325 mg total) by mouth daily with breakfast. 06/30/15  Yes Crissman, [name], MD  ipratropium-albuterol (DUONEB) 0.5-2.5 (3) MG/3ML SOLN Take 3 mLs by nebulization every 6 (six) hours as needed. 08/23/23  Yes Darlin Priestly, MD  levothyroxine (SYNTHROID) 50 MCG tablet Take 50 mcg by mouth daily before breakfast. Monday- Friday Patient takes 50 mcg 07/12/23  Yes [provider]  levothyroxine (SYNTHROID, LEVOTHROID) 75 MCG tablet Take 150 mcg by mouth daily before breakfast. Saturday-Sunday Patient takes 150 mcg 10/08/16  Yes [provider]  pantoprazole (PROTONIX) 40 MG tablet Take 1 tablet (40 mg total) by mouth 2 (two) times daily. 08/23/23  Yes Darlin Priestly, MD  potassium chloride (K-DUR,KLOR-CON) 10 MEQ tablet Take 10 mEq by mouth 2 (two) times daily.   Yes [provider]  pravastatin (PRAVACHOL) 80 MG tablet Take 1 tablet (80 mg total) by mouth daily. 06/30/15  Yes Crissman, [name], MD  Multiple Vitamin (MULTIVITAMIN WITH MINERALS) TABS tablet Take 1 tablet by mouth daily. Patient not taking: Reported on 08/26/2023 08/24/23   Darlin Priestly, MD  Polyethyl Glycol-Propyl Glycol (SYSTANE OP) Apply 1 drop to eye daily. Patient not taking: Reported on 08/26/2023    [provider]  polyethylene glycol (MIRALAX / GLYCOLAX) packet Take 17 g by mouth daily as needed for mild constipation or moderate constipation. Patient not taking: Reported on 08/26/2023    [provider]     Lucie Leather, M.D.  [facility]  Medical Director [facility] Medical Director [facility] Cardio-Pulmonary Department

## 2023-08-27 NOTE — Progress Notes (Signed)
 Mrs. Jerrol Banana, pt's daughter, was made aware that pt was moved to room 257.

## 2023-08-27 NOTE — Plan of Care (Signed)
  Problem: Education: Goal: Knowledge of General Education information will improve Description: Including pain rating scale, medication(s)/side effects and non-pharmacologic comfort measures Outcome: Progressing   Problem: Clinical Measurements: Goal: Will remain free from infection Outcome: Progressing Goal: Respiratory complications will improve Outcome: Progressing Goal: Cardiovascular complication will be avoided Outcome: Progressing   Problem: Coping: Goal: Level of anxiety will decrease Outcome: Progressing   Problem: Elimination: Goal: Will not experience complications related to bowel motility Outcome: Progressing Goal: Will not experience complications related to urinary retention Outcome: Progressing   Problem: Pain Managment: Goal: General experience of comfort will improve and/or be controlled Outcome: Progressing   Problem: Safety: Goal: Ability to remain free from injury will improve Outcome: Progressing   Problem: Skin Integrity: Goal: Risk for impaired skin integrity will decrease Outcome: Progressing

## 2023-08-27 NOTE — Consult Note (Signed)
 PHARMACY - ANTICOAGULATION CONSULT NOTE  Pharmacy Consult for IV Heparin Indication: atrial fibrillation  Patient Measurements: Height: 4\' 9"  (144.8 cm) Weight: 39.7 kg (87 lb 8.4 oz) IBW/kg (Calculated) : 38.6 Heparin Dosing Weight: 42.1 kg  Labs: Recent Labs    08/26/23 0704 08/26/23 1032 08/26/23 1140 08/26/23 1410 08/26/23 1613 08/26/23 1742 08/26/23 1950 08/27/23 0450  HGB 9.0*  --   --   --   --   --   --  9.6*  HCT 28.1*  --   --   --   --   --   --  30.6*  PLT 149*  --   --   --   --   --   --  137*  APTT  --   --  40*  --   --   --   --   --   LABPROT  --   --  19.6*  --   --   --   --   --   INR  --   --  1.6*  --   --   --   --   --   HEPARINUNFRC  --   --   --   --   --   --  0.27* 0.45  CREATININE 1.34*  --   --   --   --   --   --  0.83  CKTOTAL 62  --   --   --   --   --   --   --   TROPONINIHS 24*   < >  --  540* 649* 763*  --   --    < > = values in this interval not displayed.    Estimated Creatinine Clearance: 31.3 mL/min (by C-G formula based on SCr of 0.83 mg/dL).   Medical History: Past Medical History:  Diagnosis Date   Anemia    Arthritis    BACK   Chicken pox    Colon polyps    COPD (chronic obstructive pulmonary disease) (HCC)    Diverticulosis    Emphysema of lung (HCC)    GERD (gastroesophageal reflux disease)    Graves disease    Graves' disease with exophthalmos    REMOVED ON THYROID MEDS   Headache    MIGRAINES, 1 EVERY DAY OR LESS OFTEN   HH (hiatus hernia)    Hyperlipidemia    Pernicious anemia    Stroke (HCC)    MINI STROKES 1995 A FEW BEFORE 1995   Wears dentures    UPPER AND LOWER   Assessment: 83 y.o. female with a history of COPD, CAD, diastolic CHF, CVA, hypothyroidism, GERD, and dementia who presents with hypotension and new onset atrial fibrillation. CHADSVASc 7. Hgb trending down possibly hemodilutional. Pharmacy consulted to start heparin.   0217 1950 HL 0.27, subthera; 600 un/hr 0218 0450 HL 0.45,  therapeutic x 1  Goal of Therapy:  Heparin level 0.3-0.7 units/ml Monitor platelets by anticoagulation protocol: Yes   Plan:  --Continue heparin infusion rate at 700 un/hr --Re-check HL 8 hours to confirm --Daily CBC per protocol while on IV heparin  Otelia Sergeant, PharmD, [facility] 08/27/2023 6:01 AM

## 2023-08-27 NOTE — Evaluation (Addendum)
 Clinical/Bedside Swallow Evaluation Patient Details  Name: Robin Graves MRN: [id number] Date of Birth: 22-Jun-1941  Today's Date: 08/27/2023 Time: SLP Start Time (ACUTE ONLY): 1315 SLP Stop Time (ACUTE ONLY): 1415 SLP Time Calculation (min) (ACUTE ONLY): 60 min  Past Medical History:  Past Medical History:  Diagnosis Date   Anemia    Arthritis    BACK   Chicken pox    Colon polyps    COPD (chronic obstructive pulmonary disease) (HCC)    Diverticulosis    Emphysema of lung (HCC)    GERD (gastroesophageal reflux disease)    Graves disease    Graves' disease with exophthalmos    REMOVED ON THYROID MEDS   Headache    MIGRAINES, 1 EVERY DAY OR LESS OFTEN   HH (hiatus hernia)    Hyperlipidemia    Pernicious anemia    Stroke (HCC)    MINI STROKES 1995 A FEW BEFORE 1995   Wears dentures    UPPER AND LOWER   Past Surgical History:  Past Surgical History:  Procedure Laterality Date   ABDOMINAL HYSTERECTOMY  1974   AUGMENTATION MAMMAPLASTY Bilateral 1976   silicone   AUGMENTATION MAMMAPLASTY Bilateral 1996   saline   BACK SURGERY  JUNE,4,2015   HURT AT WORK   BREAST SURGERY Bilateral , 1973   history of leakage   CATARACT EXTRACTION W/PHACO Right 04/20/2015   Procedure: CATARACT EXTRACTION PHACO AND INTRAOCULAR LENS PLACEMENT (IOC);  Surgeon: Lockie Mola, MD;  Location: [facility];  Service: Ophthalmology;  Laterality: Right;   CATARACT EXTRACTION W/PHACO Left 01/23/2017   Procedure: CATARACT EXTRACTION PHACO AND INTRAOCULAR LENS PLACEMENT (IOC)  Left;  Surgeon: Lockie Mola, MD;  Location: [facility];  Service: Ophthalmology;  Laterality: Left;   CHOLECYSTECTOMY     COLONOSCOPY WITH PROPOFOL N/A 05/16/2015   Procedure: COLONOSCOPY WITH PROPOFOL;  Surgeon: Elnita Maxwell, MD;  Location: [facility];  Service: Endoscopy;  Laterality: N/A;   ESOPHAGOGASTRODUODENOSCOPY (EGD) WITH PROPOFOL N/A 05/16/2015   Procedure: ESOPHAGOGASTRODUODENOSCOPY  (EGD) WITH PROPOFOL;  Surgeon: Elnita Maxwell, MD;  Location: [facility] END[facility]COPY;  Service: Endoscopy;  Laterality: N/A;   EYE SURGERY     KYPHOPLASTY  12/10/2013   T12   KYPHOPLASTY N/A 11/08/2016   Procedure: KYPHOPLASTY- T9;  Surgeon: Kennedy Bucker, MD;  Location: [facility];  Service: Orthopedics;  Laterality: N/A;   MASTECTOMY Bilateral 1970's   subcutaneous mastectomy done for preventative reasons with reconstruction   thyroid ablation     HPI:  Pt is an 83 y.o. female with a history of Dementia/Memory Loss per chart note, hiatal hernia, COPD, CAD, diastolic CHF, CVA, hypothyroidism, GERD, who presents with hypotension and atrial fibrillation.  Per EMS, the patient's family or friends had not heard from her in a day or 2 and when someone went to check on her she was found down.  EMS found that her blood pressure was 64/36 and she was in atrial fibrillation.  Pt admitted to the ED this visit w/ issues including hypotension and AFIB with RVR w/ cardioconversion tx in the ED.  Patient was recently hospitalized from 2/4 - 08/15/23 due to COPD exacerbation secondary to Flu A.  Patient was treated with Tamiflu and discharged on prednisone.  Her shortness of breath has improved, but developed the pain in esophagus, described as burning pain from oral along esophageal pathway in her chest.  The pain is constant, sharp, scratching, moderate, nonradiating.  She had poor appetite and decreased oral intake because of  the pain. She readmitted 2/13-2/14/2025 and was treated for Esophagitis then D/C'd home w/ Meds.   Per recent Chest CT Imaging last on 08/14/2023: "2.1 x 2 x 1.8 cm lobular solid nodule with pleural stranding in  the base of the right upper lobe anteriorly, highly worrisome for a  primary bronchogenic neoplasm. PET-CT or tissue sampling  recommended.  3. Additional new 8 x 6 mm cavitary nodule in the left upper lobe,  could be a neoplasm or an inflammatory nodule.  4. Emphysema.  5. Aortic and coronary  artery atherosclerosis.  6. Moderate calcification and thickening of the aortic valve  leaflets. Consider echocardiographic evaluation.  7. Small hiatal hernia.  8. Osteopenia and degenerative change with multilevel thoracic spine  chronic compression fractures, with old kyphoplasty at T9 and 12.  9. 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  CXR at this admit: "Progressive diffuse interstitial and patchy/nodular bilateral  airspace disease, potentially secondary to edema given the  relatively rapid progression.".    Assessment / Plan / Recommendation  Clinical Impression    Pt seen for BSE today. Sister present. Pt awake, verbal w/ fairly consistent follow through given cues. Noted WOB/SOB at rest PRIOR TO any po's given. Noted a more barrel-chest presentation lying in bed. Speech choppy d/t declined Pulmonary status. Pt has Baseline Dementia and requires cues. OF NOTE: pt had recent ED admit w/ dx'd Esophagitis on 08/22/2023. Sister unsure if pt took her Medication prescribed for such.   On [place] O2 support- 2L; afebrile, WBC not elevated.    OF NOTE: Pt strongly endorse s/s of REFLUX and Discomfort when swallowing pointing to her mid-sternum area. She feels that liquids "are worse" than foods. She declined most trials only accepting a few po's during assessment. Sister stated pt "has not been eating since coming home".   Pt appeared to present w/ grossly functional oropharyngeal phase swallowing w/ few trials of po's w/ No overt oropharyngeal phase dysphagia appreciated during; no immediate, overt s/s of aspiration noted w/ the trials. Pt appears at a somewhat reduced risk for aspiration from an oropharyngeal phase standpoint when following general aspiration precautions w/ Support and Supervision at meals(d/t Baseline Dementia). HOWEVER, pt has a baseline presentation of REFLUX activity, recent Esophagitis, and c/o Esophageal phase Discomfort when food/liquid passes along the Esophagus (mid-sternum  area).  ANY Dysmotility or Regurgitation of Reflux material can increase risk for aspiration of the Reflux material during Retrograde flow thus impact Voicing and Pulmonary status.   Pt also has challenging factors that could impact her oropharyngeal swallowing to include discomfort in the bed(NSG aware), fatigue/weakness, deconditioning and reduced ability to feed self, Baseline Dementia/Cognitive decline, hiatal hernia and Esophageal phase Discomfort, and advanced age. These factors can increase risk for aspiration, dysphagia as well as decreased oral intake overall.     Pt positioned more midline/upright in bed for po trials (but then wanted to lie down again immediately afterwards). She consumed a few trials of thin liquids Via Cup/Straw, purees, and broken down/moistened soft solid foods w/ No immediate, overt clinical s/s of aspiration noted; clear vocal quality b/t trials, no decline in pulmonary status from her Baseline, no sustained decline in O2 sats(94-95% baseline). Frequent REST BREAKS given to support her breathing effort and for conservation of energy. Oral phase appeared grossly [facility] for bolus management and timely A-P transfer/clearing of material. Mastication adequate for modified solid foods boluses. OM exam was [facility] for oral clearing; lingual/labial movements. No unilateral weakness. Speech clear; low volume.  Recommend a Dysphagia level 2 diet (MINCED, moistened foods to aid oral intake overall d/t less effort to chew as well as support Esophageal clearing) w/ thin liquids -- strict monitoring of sips w/ pt helping to Hold Cup when drinking. Aspiration precautions. Rest Breaks b/t bites/sips and during meals/oral intake to allow for Esophageal clearing and Calming of Breathing; conservation of energy strategies. Feeding Support at meals. Positioning support at meals. REFLUX precautions strongly recommended to lessen chance for Regurgitation -- HOB elevated at night when sleeping. Pills  CRUSHED in puree.  Discussed initiation of PPI w/ MD and w/ pt also.   Recommend pt f/u w/ GI for assessment/management of Esophageal Discomfort/Dysmotility and Reflux. Discussion and handouts given on REFLUX, strategies to help manage, and foods/diet. Pt appears at her Baseline from an oropharyngeal phase standpoint in setting of comorbidities and repeat Acute illnesses; pt can have f/u at her next venue of care IF diet upgrade is sought out in the future.  MD to reconsult ST services if any new needs while admitted. NSG updated.  SLP Visit Diagnosis: Dysphagia, pharyngoesophageal phase (R13.14);Dysphagia, oral phase (R13.11) (Suspect Esophageal phase Dysmotility d/t her c/o Discomfort and Belching immediately after 2x)    Aspiration Risk  Mild aspiration risk;Risk for inadequate nutrition/hydration (from REFLUX aspiration)    Diet Recommendation   Thin;Dysphagia 2 (chopped) = a Dysphagia level 2 diet (MINCED, moistened foods to aid oral intake overall d/t less effort to chew as well as support Esophageal clearing) w/ thin liquids -- strict monitoring of sips w/ pt helping to Hold Cup when drinking. Aspiration precautions. Rest Breaks b/t bites/sips and during meals/oral intake to allow for Esophageal clearing and Calming of Breathing; conservation of energy strategies. Feeding Support at meals. Positioning support at meals. REFLUX precautions strongly recommended to lessen chance for Regurgitation -- HOB elevated at night when sleeping.  Medication Administration: Crushed with puree (Whole if able to tolerate)    Other  Recommendations Recommended Consults: Consider GI evaluation;Consider esophageal assessment (Palliative Care f/u for GOC, support; Dietician) Oral Care Recommendations: Oral care BID;Oral care before and after PO;Staff/trained caregiver to provide oral care ([employer] care)    Recommendations for follow up therapy are one component of a multi-disciplinary discharge planning process,  led by the attending physician.  Recommendations may be updated based on patient status, additional functional criteria and insurance authorization.  Follow up Recommendations No SLP follow up (indicated currently - GI f/u)      Assistance Recommended at Discharge  FULL   Functional Status Assessment Patient has had a recent decline in their functional status and/or demonstrates limited ability to make significant improvements in function in a reasonable and predictable amount of time  Frequency and Duration  (n/a)   (n/a)       Prognosis Prognosis for improved oropharyngeal function: Guarded Barriers to Reach Goals: Cognitive deficits;Time post onset;Severity of deficits;Motivation Barriers/Prognosis Comment: Known Esophagitis in recent week; Suspect Esophageal phase Dysmotility d/t her c/o discomfort      Swallow Study   General Date of Onset: 08/26/23 HPI: Pt is an 83 y.o. female with a history of Dementia/Memory Loss per chart note, COPD, CAD, diastolic CHF, CVA, hypothyroidism, GERD, who presents with hypotension and atrial fibrillation.  Per EMS, the patient's family or friends had not heard from her in a day or 2 and when someone went to check on her she was found down.  EMS found that her blood pressure was 64/36 and she was in atrial fibrillation.  Pt  admitted to the ED this visit w/ issues including hypotension and AFIB with RVR w/ cardioconversion tx in the ED.  Patient was recently hospitalized from 2/4 - 08/15/23 due to COPD exacerbation secondary to Flu A.  Patient was treated with Tamiflu and discharged on prednisone.  Her shortness of breath has improved, but developed the pain in esophagus, described as burning pain from oral along esophageal pathway in her chest.  The pain is constant, sharp, scratching, moderate, nonradiating.  She had poor appetite and decreased oral intake because of the pain. She readmitted 2/13-2/14/2025 and was treated for Esophagitis then D/C'd home w/  Meds.   Per recent Chest CT Imaging last on 08/14/2023: "2.1 x 2 x 1.8 cm lobular solid nodule with pleural stranding in  the base of the right upper lobe anteriorly, highly worrisome for a  primary bronchogenic neoplasm. PET-CT or tissue sampling  recommended.  3. Additional new 8 x 6 mm cavitary nodule in the left upper lobe,  could be a neoplasm or an inflammatory nodule.  4. Emphysema.  5. Aortic and coronary artery atherosclerosis.  6. Moderate calcification and thickening of the aortic valve  leaflets. Consider echocardiographic evaluation.  7. Small hiatal hernia.  8. Osteopenia and degenerative change with multilevel thoracic spine  chronic compression fractures, with old kyphoplasty at T9 and 12.  9. 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  CXR at this admit: "Progressive diffuse interstitial and patchy/nodular bilateral  airspace disease, potentially secondary to edema given the  relatively rapid progression.". Type of Study: Bedside Swallow Evaluation Previous Swallow Assessment: none Diet Prior to this Study: Regular;Thin liquids (Level 0) Temperature Spikes Noted: No (wbc 9.3) Respiratory Status: Nasal cannula (2L) History of Recent Intubation: No Behavior/Cognition: Alert;Cooperative;Pleasant mood;Confused;Distractible;Requires cueing (Baseline Dementia) Oral Cavity Assessment: Dry Oral Care Completed by SLP: Yes Oral Cavity - Dentition: Dentures, top;Dentures, bottom (in place) Vision: Functional for self-feeding Self-Feeding Abilities: Able to feed self;Needs assist;Needs set up;Total assist Patient Positioning: Upright in bed (MAX support) Baseline Vocal Quality: Low vocal intensity (SOB) Volitional Cough: Strong;Congested Volitional Swallow: Able to elicit    Oral/Motor/Sensory Function Overall Oral Motor/Sensory Function: Within functional limits (no unilateral weakness noted)   Ice Chips Ice chips: Within functional limits Presentation: Spoon (fed; 3 trials)   Thin  Liquid Thin Liquid: Within functional limits (grossly) Presentation: Cup;Self Fed;Straw (6 trials via each method) Other Comments: mild cough b/t trials x1 - hack cough    Nectar Thick Nectar Thick Liquid: Not tested   Honey Thick Honey Thick Liquid: Not tested   Puree Puree: Within functional limits Presentation: Spoon (fed; 5 trials accepted) Other Comments: mild cough b/t trials x1 - hack cough   Solid     Solid: Impaired Presentation: Spoon (fed; 3 trials accepted) Oral Phase Impairments: Impaired mastication (slow oral phase) Oral Phase Functional Implications: Prolonged oral transit Pharyngeal Phase Impairments:  (none) Other Comments: min increased RR         Jerilynn Som, MS, CCC-SLP Speech Language Pathologist Rehab Services; [facility] [place] [phone] (ascom) Braidyn Scorsone 08/27/2023,5:15 PM

## 2023-08-27 NOTE — Progress Notes (Signed)
 [name] CLINIC CARDIOLOGY PROGRESS NOTE       Patient ID: Robin Graves MRN: [id number] DOB/AGE: 01-07-1941 83 y.o.  Admit date: 08/26/2023 Referring Physician Dr. Dionne Bucy Primary Physician Barbette Reichmann, MD  Primary Cardiologist Dr. Darrold Junker Reason for Consultation atrial fibrillation RVR  HPI: Robin Graves is a 83 y.o. female  with a past medical history of COPD, CAD, diastolic CHF, CVA, hypothyroidism, GERD, and dementia who presents with hypotension and atrial fibrillation who presented to the ED on 08/26/2023 for hypotension.  EMS was called to her home after family had not heard from her in a few days.  Found to be hypotensive and in A-fib RVR with heart rate in the 170s.  Cardiology was consulted for further evaluation.   Interval history: -Patient seen and examined this AM, feeling better.  -Denies any CP, SOB, palpitations.  -Remaining in NSR with controlled HR.  -BP improved and she is off of pressors.   Review of systems complete and found to be negative unless listed above    Past Medical History:  Diagnosis Date   Anemia    Arthritis    BACK   Chicken pox    Colon polyps    COPD (chronic obstructive pulmonary disease) (HCC)    Diverticulosis    Emphysema of lung (HCC)    GERD (gastroesophageal reflux disease)    Graves disease    Graves' disease with exophthalmos    REMOVED ON THYROID MEDS   Headache    MIGRAINES, 1 EVERY DAY OR LESS OFTEN   HH (hiatus hernia)    Hyperlipidemia    Pernicious anemia    Stroke (HCC)    MINI STROKES 1995 A FEW BEFORE 1995   Wears dentures    UPPER AND LOWER    Past Surgical History:  Procedure Laterality Date   ABDOMINAL HYSTERECTOMY  1974   AUGMENTATION MAMMAPLASTY Bilateral 1976   silicone   AUGMENTATION MAMMAPLASTY Bilateral 1996   saline   BACK SURGERY  JUNE,4,2015   HURT AT WORK   BREAST SURGERY Bilateral , 1973   history of leakage   CATARACT EXTRACTION W/PHACO Right 04/20/2015   Procedure:  CATARACT EXTRACTION PHACO AND INTRAOCULAR LENS PLACEMENT (IOC);  Surgeon: Lockie Mola, MD;  Location: [facility];  Service: Ophthalmology;  Laterality: Right;   CATARACT EXTRACTION W/PHACO Left 01/23/2017   Procedure: CATARACT EXTRACTION PHACO AND INTRAOCULAR LENS PLACEMENT (IOC)  Left;  Surgeon: Lockie Mola, MD;  Location: [facility];  Service: Ophthalmology;  Laterality: Left;   CHOLECYSTECTOMY     COLONOSCOPY WITH PROPOFOL N/A 05/16/2015   Procedure: COLONOSCOPY WITH PROPOFOL;  Surgeon: Elnita Maxwell, MD;  Location: [facility] END[facility];  Service: Endoscopy;  Laterality: N/A;   ESOPHAGOGASTRODUODENOSCOPY (EGD) WITH PROPOFOL N/A 05/16/2015   Procedure: ESOPHAGOGASTRODUODENOSCOPY (EGD) WITH PROPOFOL;  Surgeon: Elnita Maxwell, MD;  Location: [facility] ENDOSCOPY;  Service: Endoscopy;  Laterality: N/A;   EYE SURGERY     KYPHOPLASTY  12/10/2013   T12   KYPHOPLASTY N/A 11/08/2016   Procedure: KYPHOPLASTY- T9;  Surgeon: Kennedy Bucker, MD;  Location: [facility] ORS;  Service: Orthopedics;  Laterality: N/A;   MASTECTOMY Bilateral 1970's   subcutaneous mastectomy done for preventative reasons with reconstruction   thyroid ablation      Medications Prior to Admission  Medication Sig Dispense Refill Last Dose/Taking   albuterol (VENTOLIN HFA) 108 (90 Base) MCG/ACT inhaler Inhale 2 puffs into the lungs every 6 (six) hours as needed for wheezing or shortness of breath. 8  g 2 08/25/2023   aspirin EC 81 MG tablet Take 81 mg by mouth daily. AM   Past Week   cyanocobalamin (,VITAMIN B-12,) 1000 MCG/ML injection INJECT 1 ML AS DIRECTED MONTHLY 10 mL 12 08/25/2023   donepezil (ARICEPT) 5 MG tablet Take 5 mg by mouth in the morning.   08/25/2023   feeding supplement (ENSURE ENLIVE / ENSURE PLUS) LIQD Take 237 mLs by mouth 2 (two) times daily between meals.   08/25/2023   ferrous sulfate 325 (65 FE) MG tablet Take 1 tablet (325 mg total) by mouth daily with breakfast. 30 tablet 7 Past Week    ipratropium-albuterol (DUONEB) 0.5-2.5 (3) MG/3ML SOLN Take 3 mLs by nebulization every 6 (six) hours as needed. 120 mL 2 Taking As Needed   levothyroxine (SYNTHROID) 50 MCG tablet Take 50 mcg by mouth daily before breakfast. Monday- Friday Patient takes 50 mcg   08/25/2023   levothyroxine (SYNTHROID, LEVOTHROID) 75 MCG tablet Take 150 mcg by mouth daily before breakfast. Saturday-Sunday Patient takes 150 mcg   Past Week   pantoprazole (PROTONIX) 40 MG tablet Take 1 tablet (40 mg total) by mouth 2 (two) times daily. 60 tablet 2 Past Week   potassium chloride (K-DUR,KLOR-CON) 10 MEQ tablet Take 10 mEq by mouth 2 (two) times daily.   Past Week   pravastatin (PRAVACHOL) 80 MG tablet Take 1 tablet (80 mg total) by mouth daily. 30 tablet 7 08/25/2023   Social History   Socioeconomic History   Marital status: Widowed    Spouse name: Not on file   Number of children: Not on file   Years of education: Not on file   Highest education level: Not on file  Occupational History   Not on file  Tobacco Use   Smoking status: Former    Current packs/day: 0.00    Average packs/day: 0.3 packs/day for 4.0 years (1.0 ttl pk-yrs)    Types: Cigarettes    Start date: 04/23/2004    Quit date: 04/23/2008    Years since quitting: 15.3   Smokeless tobacco: Never   Tobacco comments:    was still having occ cig until 07/2016  Substance and Sexual Activity   Alcohol use: No   Drug use: No   Sexual activity: Not on file  Other Topics Concern   Not on file  Social History Narrative   Not on file   Social Drivers of Health   Financial Resource Strain: Low Risk  (08/21/2023)   Received from [facility]   Overall Financial Resource Strain (CARDIA)    Difficulty of Paying Living Expenses: Not hard at all  Food Insecurity: Patient Unable To Answer (08/26/2023)   Hunger Vital Sign    Worried About Running Out of Food in the Last Year: Patient unable to answer    Ran Out of Food in the Last Year:  Patient unable to answer  Transportation Needs: Patient Unable To Answer (08/26/2023)   PRAPARE - Transportation    Lack of Transportation (Medical): Patient unable to answer    Lack of Transportation (Non-Medical): Patient unable to answer  Physical Activity: Not on file  Stress: Not on file  Social Connections: Unknown (08/26/2023)   Social Connection and Isolation Panel [NHANES]    Frequency of Communication with Friends and Family: Patient unable to answer    Frequency of Social Gatherings with Friends and Family: Patient unable to answer    Attends Religious Services: Patient unable to answer    Active Member  of Clubs or Organizations: Yes    Attends Banker Meetings: Patient unable to answer    Marital Status: Patient unable to answer  Intimate Partner Violence: Unknown (08/26/2023)   Humiliation, Afraid, Rape, and Kick questionnaire    Fear of Current or Ex-Partner: Patient unable to answer    Emotionally Abused: Patient unable to answer    Physically Abused: No    Sexually Abused: Patient unable to answer    Family History  Problem Relation Age of Onset   Coronary artery disease Mother    Stroke Father    Heart attack Father    Cancer Brother        colon   Stroke Brother    Heart attack Brother    Diabetes Brother      Vitals:   08/27/23 0730 08/27/23 0800 08/27/23 0830 08/27/23 0900  BP: (!) 123/53 93/68 (!) 111/58 (!) 122/59  Pulse: 74 96 79 79  Resp: 19 (!) 40 20 17  Temp:      TempSrc:      SpO2: 100% 100% 98% 100%  Weight:      Height:        PHYSICAL EXAM General: Ill-appearing elderly female, well nourished, in no acute distress. HEENT: Normocephalic and atraumatic. Neck: No JVD.  Lungs: Normal respiratory effort on supplemental O2. Clear bilaterally to auscultation. No wheezes, crackles, rhonchi.  Heart: HRRR. Normal S1 and S2 without gallops or murmurs.  Abdomen: Non-distended appearing.  Msk: Normal strength and tone for  age. Extremities: Warm and well perfused. No clubbing, cyanosis.  No edema.  Neuro: Alert and oriented X 3. Psych: Answers questions appropriately.   Labs: Basic Metabolic Panel: Recent Labs    08/26/23 0704 08/27/23 0450  NA 149* 146*  K 2.9* 3.8  CL 120* 121*  CO2 20* 16*  GLUCOSE 93 67*  BUN 55* 35*  CREATININE 1.34* 0.83  CALCIUM 7.8* 7.9*  MG 2.2 2.0  PHOS  --  1.7*   Liver Function Tests: Recent Labs    08/26/23 0704 08/27/23 0450  AST 31  --   ALT 27  --   ALKPHOS 93  --   BILITOT 0.8  --   PROT 5.3*  --   ALBUMIN 1.8* 1.9*   No results for input(s): "LIPASE", "AMYLASE" in the last 72 hours. CBC: Recent Labs    08/26/23 0704 08/27/23 0450  WBC 14.5* 9.3  NEUTROABS 13.2*  --   HGB 9.0* 9.6*  HCT 28.1* 30.6*  MCV 98.3 97.5  PLT 149* 137*   Cardiac Enzymes: Recent Labs    08/26/23 0704 08/26/23 1032 08/26/23 1613 08/26/23 1742 08/27/23 0450  CKTOTAL 62  --   --   --   --   TROPONINIHS 24*   < > 649* 763* 638*   < > = values in this interval not displayed.   BNP: Recent Labs    08/26/23 0704  BNP 402.6*   D-Dimer: No results for input(s): "DDIMER" in the last 72 hours. Hemoglobin A1C: No results for input(s): "HGBA1C" in the last 72 hours. Fasting Lipid Panel: No results for input(s): "CHOL", "HDL", "LDLCALC", "TRIG", "CHOLHDL", "LDLDIRECT" in the last 72 hours. Thyroid Function Tests: Recent Labs    08/26/23 2203  TSH 1.469   Anemia Panel: No results for input(s): "VITAMINB12", "FOLATE", "FERRITIN", "TIBC", "IRON", "RETICCTPCT" in the last 72 hours.   Radiology: ECHOCARDIOGRAM COMPLETE Result Date: 08/26/2023    ECHOCARDIOGRAM REPORT   Patient Name:  Robin Graves Date of Exam: 08/26/2023 Medical Rec #:  [id number]   Height:       60.0 in Accession #:    [id number]  Weight:       92.8 lb Date of Birth:  11-18-1940   BSA:          1.347 m Patient Age:    82 years    BP:           95/57 mmHg Patient Gender: F           HR:           92  bpm. Exam Location:  [facility] Procedure: 2D Echo, Cardiac Doppler and Color Doppler (Both Spectral and Color            Flow Doppler were utilized during procedure). Indications:     Shock R57.9  History:         Patient has no prior history of Echocardiogram examinations.                  COPD and Stroke.  Sonographer:     Cristela Blue Referring Phys:  [id number] Aralyn Nowak Diagnosing Phys: Windell Norfolk  Sonographer Comments: Technically challenging study due to limited acoustic windows. Image acquisition challenging due to COPD. IMPRESSIONS  1. Technically difficult study.  2. Left ventricular ejection fraction, by estimation, is 60 to 65%. The left ventricle has normal function. The left ventricle has no regional wall motion abnormalities. There is mild left ventricular hypertrophy. Left ventricular diastolic parameters are indeterminate.  3. Right ventricular systolic function is normal. The right ventricular size is normal.  4. Moderate mitral valve regurgitation.  5. The aortic valve was not well visualized. There is moderate calcification of the aortic valve. Aortic valve regurgitation is not visualized. Mild aortic valve stenosis. FINDINGS  Left Ventricle: Left ventricular ejection fraction, by estimation, is 60 to 65%. The left ventricle has normal function. The left ventricle has no regional wall motion abnormalities. Strain imaging was not performed. The left ventricular internal cavity  size was normal in size. There is mild left ventricular hypertrophy. Left ventricular diastolic parameters are indeterminate. Right Ventricle: The right ventricular size is normal. No increase in right ventricular wall thickness. Right ventricular systolic function is normal. Left Atrium: Left atrial size was normal in size. Right Atrium: Right atrial size was normal in size. Pericardium: There is no evidence of pericardial effusion. Mitral Valve: There is mild thickening of the mitral valve leaflet(s). Mild mitral annular  calcification. Moderate mitral valve regurgitation. MV peak gradient, 4.2 mmHg. The mean mitral valve gradient is 3.0 mmHg. Tricuspid Valve: The tricuspid valve is not well visualized. Tricuspid valve regurgitation is mild. Aortic Valve: Peak velocity 2.4 m/s, Mean gradient 13 mmHg, Dimensionless index 0.50. The aortic valve was not well visualized. There is moderate calcification of the aortic valve. Aortic valve regurgitation is not visualized. Mild aortic stenosis is present. Aortic valve mean gradient measures 9.2 mmHg. Aortic valve peak gradient measures 16.6 mmHg. Aortic valve area, by VTI measures 1.90 cm. Pulmonic Valve: The pulmonic valve was not well visualized. Pulmonic valve regurgitation is trivial. Aorta: The aortic root is normal in size and structure. Venous: The inferior vena cava was not well visualized. IAS/Shunts: The interatrial septum was not well visualized. Additional Comments: 3D imaging was not performed.  LEFT VENTRICLE PLAX 2D LVIDd:         2.20 cm   Diastology LVIDs:  1.50 cm   LV e' medial:    7.62 cm/s LV PW:         0.90 cm   LV E/e' medial:  11.5 LV IVS:        1.10 cm   LV e' lateral:   11.40 cm/s LVOT diam:     2.00 cm   LV E/e' lateral: 7.7 LV SV:         68 LV SV Index:   50 LVOT Area:     3.14 cm  RIGHT VENTRICLE RV Basal diam:  2.90 cm RV Mid diam:    3.20 cm RV S prime:     13.60 cm/s TAPSE (M-mode): 1.9 cm LEFT ATRIUM           Index        RIGHT ATRIUM          Index LA diam:      2.80 cm 2.08 cm/m   RA Area:     7.09 cm LA Vol (A2C): 9.1 ml  6.76 ml/m   RA Volume:   10.30 ml 7.65 ml/m LA Vol (A4C): 18.1 ml 13.44 ml/m  AORTIC VALVE AV Area (Vmax):    1.80 cm AV Area (Vmean):   1.96 cm AV Area (VTI):     1.90 cm AV Vmax:           204.00 cm/s AV Vmean:          137.750 cm/s AV VTI:            0.358 m AV Peak Grad:      16.6 mmHg AV Mean Grad:      9.2 mmHg LVOT Vmax:         117.00 cm/s LVOT Vmean:        86.100 cm/s LVOT VTI:          0.216 m LVOT/AV VTI  ratio: 0.60  AORTA Ao Root diam: 2.90 cm MITRAL VALVE                  TRICUSPID VALVE MV Area (PHT): 3.76 cm       TR Peak grad:   20.8 mmHg MV Area VTI:   2.94 cm       TR Vmax:        228.00 cm/s MV Peak grad:  4.2 mmHg MV Mean grad:  3.0 mmHg       SHUNTS MV Vmax:       1.02 m/s       Systemic VTI:  0.22 m MV Vmean:      78.2 cm/s      Systemic Diam: 2.00 cm MV Decel Time: 202 msec MR Peak grad:    99.2 mmHg MR Mean grad:    51.0 mmHg MR Vmax:         498.00 cm/s MR Vmean:        326.0 cm/s MR PISA:         1.01 cm MR PISA Eff ROA: 6 mm MR PISA Radius:  0.40 cm MV E velocity: 88.00 cm/s MV A velocity: 100.00 cm/s MV E/A ratio:  0.88 Windell Norfolk Electronically signed by Windell Norfolk Signature Date/Time: 08/26/2023/5:38:03 PM    Final    DG Chest Port 1 View Result Date: 08/26/2023 CLINICAL DATA:  Atrial fibrillation.  Unresponsive. EXAM: PORTABLE CHEST 1 VIEW COMPARISON:  08/22/2023 FINDINGS: Marked thoracolumbar scoliosis. Cardiopericardial silhouette is at upper limits of normal for size. Diffuse interstitial and patchy/nodular bilateral airspace  disease appears progressive in the interval, potentially secondary to edema given the relatively rapid progression. Diffuse infection not excluded. Nodular right parahilar density seen previously is again noted. Bones are diffusely demineralized. Telemetry leads overlie the chest. IMPRESSION: 1. Progressive diffuse interstitial and patchy/nodular bilateral airspace disease, potentially secondary to edema given the relatively rapid progression. Diffuse infection not excluded. Close follow-up recommended to ensure resolution. 2. Nodular right parahilar density seen previously is again noted. Electronically Signed   By: Kennith Center M.D.   On: 08/26/2023 08:11   DG Chest 2 View Result Date: 08/22/2023 CLINICAL DATA:  Increasing pain in mouth, esophagus, and stomach for 1 week, dysphagia EXAM: CHEST - 2 VIEW COMPARISON:  08/14/2023, 08/13/2023 FINDINGS:  Frontal and lateral views of the chest demonstrate a stable cardiac silhouette. The right upper lobe mass seen on prior CT is faintly apparent adjacent to the right hilum on the frontal view, concerning for malignancy based on previous CT imaging. The subcentimeter left upper lobe nodule seen on prior CT is not well visualized by x-ray. Stable asymmetric right apical pleural thickening, likely benign scarring given long-term stability. No acute airspace disease, effusion, or pneumothorax. Stable thoracic compression deformities with prior vertebral augmentations. IMPRESSION: 1. Stable right upper lobe perihilar mass concerning for malignancy based on previous CT findings. Subcentimeter left upper lobe nodule seen on prior CT is not visualized by x-ray. PET scan again recommended if not performed in the interim. 2. No acute airspace disease. Electronically Signed   By: Sharlet Salina M.D.   On: 08/22/2023 15:34   CT Angio Chest PE W and/or Wo Contrast Result Date: 08/14/2023 CLINICAL DATA:  Shortness of breath, abdominal pain, positive flu test on 4 days ago. Also, PA and lateral chest yesterday demonstrated suspected mass in the retrosternal clear space on the lateral view only. EXAM: CT ANGIOGRAPHY CHEST WITH CONTRAST TECHNIQUE: Multidetector CT imaging of the chest was performed using the standard protocol during bolus administration of intravenous contrast. Multiplanar CT image reconstructions and MIPs were obtained to evaluate the vascular anatomy. RADIATION DOSE REDUCTION: This exam was performed according to the departmental dose-optimization program which includes automated exposure control, adjustment of the mA and/or kV according to patient size and/or use of iterative reconstruction technique. CONTRAST:  75mL OMNIPAQUE IOHEXOL 350 MG/ML SOLN COMPARISON:  PA and lateral chest yesterday, and chest, abdomen and pelvis CT with IV contrast 04/29/2020. FINDINGS: Cardiovascular: The cardiac size is normal.  There is no pericardial effusion. Three-vessel coronary artery calcifications greatest in the LAD, additional scattered calcification in the left main coronary artery. There is moderate calcification and thickening of the aortic valve leaflets. Consider echocardiographic evaluation. The aorta is normal in caliber with mild tortuosity and moderate calcific plaques, with atherosclerosis in the great vessels. Pulmonary arteries are normal in caliber without evidence of thromboemboli. The pulmonary veins are slightly distended but no more than previously. Mediastinum/Nodes: No enlarged mediastinal, hilar, or axillary lymph nodes. Thyroid gland, trachea, and esophagus demonstrate no significant findings. There is a small hiatal hernia. Lungs/Pleura: There is right-greater-than-left biapical pleural-parenchymal scarring, with right apical volume loss and upward hilar retraction and left apical linear calcifications within the scarring. On the right scarring changes merge with coarsely nodular chronic scar-like opacities. There are mild centrilobular emphysematous changes in the lungs. Anteriorly in the base of the right upper lobe there is new demonstration of a lobular solid nodule with pleural stranding measuring 2.1 x 2 x 1.8 cm (measured on 5:56 and 7:32). This is highly  worrisome for a primary bronchogenic neoplasm. PET-CT or tissue sampling recommended. In the left upper lobe a new nodule is also noted on 5:34, with slight cavitation and measuring 8 x 6 mm. This could be a neoplasm or an inflammatory nodule. In the left lower lobe, there is a chronic stable 4 mm nodule on 5:97. There are scattered linear scar-like opacities in the bases. There is no consolidation, effusion or further nodules. Upper Abdomen: No acute abnormality. Status post cholecystectomy with chronically prominent common bile duct. Abdominal aortic atherosclerosis. Musculoskeletal: There are multilevel thoracic spine chronic compression fractures,  with old kyphoplasty at T9 and 12. Osteopenia and degenerative change with thoracic kyphosis. No new or progressive thoracic compression fractures. The ribcage is intact. No destructive bone lesion. Bilateral breast implants noted with chronic collapse of the left implant. Review of the MIP images confirms the above findings. IMPRESSION: 1. No evidence of arterial dilatation or thromboembolism. 2. 2.1 x 2 x 1.8 cm lobular solid nodule with pleural stranding in the base of the right upper lobe anteriorly, highly worrisome for a primary bronchogenic neoplasm. PET-CT or tissue sampling recommended. 3. Additional new 8 x 6 mm cavitary nodule in the left upper lobe, could be a neoplasm or an inflammatory nodule. 4. Emphysema. 5. Aortic and coronary artery atherosclerosis. 6. Moderate calcification and thickening of the aortic valve leaflets. Consider echocardiographic evaluation. 7. Small hiatal hernia. 8. Osteopenia and degenerative change with multilevel thoracic spine chronic compression fractures, with old kyphoplasty at T9 and 12. 9. 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 Aortic Atherosclerosis (ICD10-I70.0) and Emphysema (ICD10-J43.9). Electronically Signed   By: Almira Bar M.D.   On: 08/14/2023 01:21   DG Chest 2 View Result Date: 08/13/2023 CLINICAL DATA:  Shortness of breath. EXAM: CHEST - 2 VIEW COMPARISON:  07/04/2013.  Chest CT dated 04/29/2020. FINDINGS: Enlarged cardiac silhouette. Hyperexpanded lungs. Interval nodular density overlying the anterior upper thorax on the lateral view, not seen on the frontal view. No significant change in right greater than left biapical pleural and parenchymal scarring. Multiple thoracic and lumbar compression deformities are again demonstrated with kyphoplasty at 2 levels. Diffuse osteopenia. Mild-to-moderate dextroconvex thoracolumbar scoliosis. Cholecystectomy clips. Atheromatous aortic calcifications. IMPRESSION: 1. Interval nodular density overlying the  anterior upper thorax on the lateral view, not seen on the frontal view. Recommend chest CT for further evaluation. 2. COPD. 3. Cardiomegaly. Electronically Signed   By: Beckie Salts M.D.   On: 08/13/2023 19:39    ECHO as above  TELEMETRY reviewed by me 08/27/2023: Sinus rhythm rate 100s  EKG reviewed by me: Atrial fibrillation rate 119 bpm  Data reviewed by me 08/27/2023: last 24h vitals tele labs imaging I/O hospitalist progress note  Principal Problem:   Cardiogenic shock (HCC)    ASSESSMENT AND PLAN:  Robin Graves is a 83 y.o. female  with a past medical history of COPD, CAD, diastolic CHF, CVA, hypothyroidism, GERD, and dementia who presents with hypotension and atrial fibrillation who presented to the ED on 08/26/2023 for hypotension.  EMS was called to her home after family had not heard from her in a few days.  Found to be hypotensive and in A-fib RVR with heart rate in the 170s.  Cardiology was consulted for further evaluation.   # Atrial fibrillation RVR # NSTEMI, suspect type II # Shock, unclear etiology Patient found down in her home by EMS with significant hypotension and in atrial fibrillation RVR.  She was cardioverted x 4 in the ED and  started on IV amiodarone, now maintaining in normal sinus rhythm.  Troponins trended 24 > 159 > 540 > 649 > 763 > 638.  She is without chest pain or anginal symptoms, EKG without acute ST-T changes. Echo with preserved EF, no WMAs.  -Transition to eliquis 2.5 mg twice daily for stroke risk reduction (dose appropriate given age >67 and weight <60 kg.  Continue aspirin 81 mg daily. -Continue IV amiodarone until tomorrow. Will transition to oral. -No plan for further cardiac diagnostics at this time.     This patient's plan of care was discussed and created with Dr. Corky Sing and he is in agreement.  Signed: Gale Journey, PA-C  08/27/2023, 9:28 AM [facility]

## 2023-08-27 NOTE — Consult Note (Signed)
 Consultation Note Date: 08/27/2023   Patient Name: Robin Graves  DOB: 11/21/1940  MRN: [id number]  Age / Sex: 83 y.o., female  PCP: Barbette Reichmann, MD Referring Physician: Erin Fulling, MD  Reason for Consultation: Establishing goals of care  HPI/Patient Profile: PER H&P  83 y.o. female with a history of COPD, CAD, diastolic CHF, CVA, hypothyroidism, GERD, and dementia who presents with hypotension and atrial fibrillation.  Per EMS, the patient's family or friends had not heard from her in a day or 2 and when someone went to check on her she was found down.   EMS found that her blood pressure was 64/36 and she was in atrial fibrillation with RVR with a rate in the 170s.   The patient reported chest pain.   EMS gave a dose of diltiazem with an improvement in her heart rate to the 140s.    Clinical Assessment and Goals of Care: Notes and labs reviewed.  In to see patient.  She is currently resting in bed, no family at bedside.  She is able to tell me her name, where we are, the year, the president.  She states she is widowed.  She advises that she has 1 child who lives in [place].  Patient states her daughter has came to the [place] area, but has been working remotely from a hotel room.  She states her daughter does not have Internet access if she were to stay in patient's home.  She states she has assistive devices but does not use them.  She states she continues to drive.  She is independent at baseline.  Patient advises that she is hopeful to go home soon.  We discussed her diagnosis, prognosis, GOC, EOL wishes disposition and options.  Created space and opportunity for patient  to explore thoughts and feelings regarding current medical information.   A detailed discussion was had today regarding advanced directives.  Concepts specific to code status, artifical feeding and hydration, IV  antibiotics and rehospitalization were discussed.  The difference between an aggressive medical intervention path and a comfort care path was discussed.  Values and goals of care important to patient and family were attempted to be elicited.  Discussed limitations of medical interventions to prolong quality of life in some situations and discussed the concept of human mortality.  She states she does not believe she would ever want CPR.  She is unsure if she would ever want to be put on life support, but knows she would not want to be on ventilator support long-term.  She states she would like time to consider this and to talk to her daughter prior to making decisions.  She confirms that her daughter would be her surrogate decision maker if she is unable to make decisions for herself.  SUMMARY OF RECOMMENDATIONS   Patient considering her wishes on CODE STATUS and any boundaries to care.        Primary Diagnoses: Present on Admission:  Cardiogenic shock (HCC)   I have reviewed the medical  record, interviewed the patient and family, and examined the patient. The following aspects are pertinent.  Past Medical History:  Diagnosis Date   Anemia    Arthritis    BACK   Chicken pox    Colon polyps    COPD (chronic obstructive pulmonary disease) (HCC)    Diverticulosis    Emphysema of lung (HCC)    GERD (gastroesophageal reflux disease)    Graves disease    Graves' disease with exophthalmos    REMOVED ON THYROID MEDS   Headache    MIGRAINES, 1 EVERY DAY OR LESS OFTEN   HH (hiatus hernia)    Hyperlipidemia    Pernicious anemia    Stroke [facility])    MINI STROKES 1995 A FEW BEFORE 1995   Wears dentures    UPPER AND LOWER   Social History   Socioeconomic History   Marital status: Widowed    Spouse name: Not on file   Number of children: Not on file   Years of education: Not on file   Highest education level: Not on file  Occupational History   Not on file  Tobacco Use    Smoking status: Former    Current packs/day: 0.00    Average packs/day: 0.3 packs/day for 4.0 years (1.0 ttl pk-yrs)    Types: Cigarettes    Start date: 04/23/2004    Quit date: 04/23/2008    Years since quitting: 15.3   Smokeless tobacco: Never   Tobacco comments:    was still having occ cig until 07/2016  Substance and Sexual Activity   Alcohol use: No   Drug use: No   Sexual activity: Not on file  Other Topics Concern   Not on file  Social History Narrative   Not on file   Social Drivers of Health   Financial Resource Strain: Low Risk  (08/21/2023)   Received from [facility]   Overall Financial Resource Strain (CARDIA)    Difficulty of Paying Living Expenses: Not hard at all  Food Insecurity: Patient Unable To Answer (08/26/2023)   Hunger Vital Sign    Worried About Running Out of Food in the Last Year: Patient unable to answer    Ran Out of Food in the Last Year: Patient unable to answer  Transportation Needs: Patient Unable To Answer (08/26/2023)   PRAPARE - Transportation    Lack of Transportation (Medical): Patient unable to answer    Lack of Transportation (Non-Medical): Patient unable to answer  Physical Activity: Not on file  Stress: Not on file  Social Connections: Unknown (08/26/2023)   Social Connection and Isolation Panel [NHANES]    Frequency of Communication with Friends and Family: Patient unable to answer    Frequency of Social Gatherings with Friends and Family: Patient unable to answer    Attends Religious Services: Patient unable to answer    Active Member of Clubs or Organizations: Yes    Attends Banker Meetings: Patient unable to answer    Marital Status: Patient unable to answer   Family History  Problem Relation Age of Onset   Coronary artery disease Mother    Stroke Father    Heart attack Father    Cancer Brother        colon   Stroke Brother    Heart attack Brother    Diabetes Brother    Scheduled Meds:   amiodarone  400 mg Oral BID   Followed by   Melene Muller ON 09/06/2023] amiodarone  200  mg Oral Daily   apixaban  2.5 mg Oral BID   aspirin EC  81 mg Oral Daily   budesonide (PULMICORT) nebulizer solution  0.5 mg Nebulization BID   Chlorhexidine Gluconate Cloth  6 each Topical QHS   feeding supplement  237 mL Oral BID BM   [START ON 08/28/2023] levothyroxine  50 mcg Oral Once per day on Monday Tuesday Wednesday Thursday Friday   [START ON 08/31/2023] levothyroxine  75 mcg Oral Once per day on Sunday Saturday   [START ON 08/28/2023] multivitamin with minerals  1 tablet Oral Daily   pantoprazole (PROTONIX) IV  40 mg Intravenous Daily   revefenacin  175 mcg Nebulization Daily   sodium chloride flush  3 mL Intravenous Q12H   Continuous Infusions:  amiodarone 30 mg/hr (08/27/23 1400)   PRN Meds:.acetaminophen, docusate sodium, ipratropium-albuterol, ondansetron (ZOFRAN) IV, mouth rinse, phenylephrine, polyethylene glycol, sodium chloride flush Medications Prior to Admission:  Prior to Admission medications   Medication Sig Start Date End Date Taking? Authorizing Provider  albuterol (VENTOLIN HFA) 108 (90 Base) MCG/ACT inhaler Inhale 2 puffs into the lungs every 6 (six) hours as needed for wheezing or shortness of breath. 08/15/23  Yes Esaw Grandchild A, DO  aspirin EC 81 MG tablet Take 81 mg by mouth daily. AM   Yes [provider]  cyanocobalamin (,VITAMIN B-12,) 1000 MCG/ML injection INJECT 1 ML AS DIRECTED MONTHLY 10/03/15  Yes Crissman, [name], MD  donepezil (ARICEPT) 5 MG tablet Take 5 mg by mouth in the morning. 07/16/23 07/15/24 Yes [provider]  feeding supplement (ENSURE ENLIVE / ENSURE PLUS) LIQD Take 237 mLs by mouth 2 (two) times daily between meals. 08/24/23  Yes Darlin Priestly, MD  ferrous sulfate 325 (65 FE) MG tablet Take 1 tablet (325 mg total) by mouth daily with breakfast. 06/30/15  Yes Crissman, [name], MD  ipratropium-albuterol (DUONEB) 0.5-2.5 (3) MG/3ML SOLN Take 3 mLs by  nebulization every 6 (six) hours as needed. 08/23/23  Yes Darlin Priestly, MD  levothyroxine (SYNTHROID) 50 MCG tablet Take 50 mcg by mouth daily before breakfast. Monday- Friday Patient takes 50 mcg 07/12/23  Yes [provider]  levothyroxine (SYNTHROID, LEVOTHROID) 75 MCG tablet Take 75 mcg by mouth daily before breakfast. Saturday-Sunday 10/08/16  Yes [provider]  pantoprazole (PROTONIX) 40 MG tablet Take 1 tablet (40 mg total) by mouth 2 (two) times daily. 08/23/23  Yes Darlin Priestly, MD  potassium chloride (K-DUR,KLOR-CON) 10 MEQ tablet Take 10 mEq by mouth 2 (two) times daily.   Yes [provider]  pravastatin (PRAVACHOL) 80 MG tablet Take 1 tablet (80 mg total) by mouth daily. 06/30/15  Yes Steele Sizer, MD   Allergies  Allergen Reactions   Codeine     unknown   Review of Systems  All other systems reviewed and are negative.   Physical Exam Pulmonary:     Effort: Pulmonary effort is normal.  Neurological:     Mental Status: She is alert.     Vital Signs: BP 133/62   Pulse 72   Temp 97.7 F (36.5 C) (Oral)   Resp (!) 24   Ht 4\' 9"  (1.448 m)   Wt 39.7 kg   SpO2 94%   BMI 18.94 kg/m  Pain Scale: 0-10   Pain Score: 0-No pain   SpO2: SpO2: 94 % O2 Device:SpO2: 94 % O2 Flow Rate: .O2 Flow Rate (L/min): 2 L/min  IO: Intake/output summary:  Intake/Output Summary (Last 24 hours) at 08/27/2023  1538 Last data filed at 08/27/2023 1400 Gross per 24 hour  Intake 1843.41 ml  Output 450 ml  Net 1393.41 ml    LBM: Last BM Date : 08/27/23 Baseline Weight: Weight: 37.8 kg Most recent weight: Weight: 39.7 kg       Signed by: Morton Stall, NP   Please contact Palliative Medicine Team phone at [phone] for questions and concerns.  For individual provider: See Loretha Stapler

## 2023-08-28 ENCOUNTER — Other Ambulatory Visit: Payer: Medicare PPO

## 2023-08-28 DIAGNOSIS — D696 Thrombocytopenia, unspecified: Secondary | ICD-10-CM | POA: Insufficient documentation

## 2023-08-28 DIAGNOSIS — E870 Hyperosmolality and hypernatremia: Secondary | ICD-10-CM | POA: Insufficient documentation

## 2023-08-28 DIAGNOSIS — E876 Hypokalemia: Secondary | ICD-10-CM | POA: Insufficient documentation

## 2023-08-28 DIAGNOSIS — Z789 Other specified health status: Secondary | ICD-10-CM

## 2023-08-28 DIAGNOSIS — R570 Cardiogenic shock: Secondary | ICD-10-CM | POA: Diagnosis not present

## 2023-08-28 DIAGNOSIS — A419 Sepsis, unspecified organism: Secondary | ICD-10-CM | POA: Insufficient documentation

## 2023-08-28 DIAGNOSIS — J690 Pneumonitis due to inhalation of food and vomit: Secondary | ICD-10-CM | POA: Insufficient documentation

## 2023-08-28 LAB — RENAL FUNCTION PANEL
Albumin: 1.7 g/dL — ABNORMAL LOW (ref 3.5–5.0)
Anion gap: 8 (ref 5–15)
BUN: 22 mg/dL (ref 8–23)
CO2: 20 mmol/L — ABNORMAL LOW (ref 22–32)
Calcium: 8.2 mg/dL — ABNORMAL LOW (ref 8.9–10.3)
Chloride: 118 mmol/L — ABNORMAL HIGH (ref 98–111)
Creatinine, Ser: 0.76 mg/dL (ref 0.44–1.00)
GFR, Estimated: >60 mL/min (ref >60)
Glucose, Bld: 99 mg/dL (ref 70–99)
Phosphorus: 1.7 mg/dL — ABNORMAL LOW (ref 2.5–4.6)
Potassium: 2.4 mmol/L — CL (ref 3.5–5.1)
Sodium: 146 mmol/L — ABNORMAL HIGH (ref 135–145)

## 2023-08-28 LAB — CBC
HCT: 29.1 % — ABNORMAL LOW (ref 36.0–46.0)
Hemoglobin: 9.8 g/dL — ABNORMAL LOW (ref 12.0–15.0)
MCH: 30.8 pg (ref 26.0–34.0)
MCHC: 33.7 g/dL (ref 30.0–36.0)
MCV: 91.5 fL (ref 80.0–100.0)
Platelets: 148 10*3/uL — ABNORMAL LOW (ref 150–400)
RBC: 3.18 MIL/uL — ABNORMAL LOW (ref 3.87–5.11)
RDW: 14.4 % (ref 11.5–15.5)
WBC: 10.9 10*3/uL — ABNORMAL HIGH (ref 4.0–10.5)
nRBC: 0 % (ref 0.0–0.2)

## 2023-08-28 LAB — POTASSIUM: Potassium: 5.1 mmol/L (ref 3.5–5.1)

## 2023-08-28 LAB — BLOOD GAS, VENOUS
Bicarbonate: 17.2 mmol/L — ABNORMAL LOW (ref 20.0–28.0)
O2 Saturation: 37.4 mmol/L — ABNORMAL HIGH (ref 0.0–2.0)
Patient temperature: 37
Patient temperature: 37.4 %
pCO2, Ven: 42 mm[Hg] — ABNORMAL LOW (ref 44–60)
pH, Ven: 7.22 — ABNORMAL LOW (ref 7.25–7.43)
pO2, Ven: 17.2 mmol/L — ABNORMAL LOW (ref 32–45)

## 2023-08-28 LAB — MAGNESIUM: Magnesium: 1.9 mg/dL (ref 1.7–2.4)

## 2023-08-28 LAB — PROCALCITONIN: Procalcitonin: 0.41 ng/mL

## 2023-08-28 MED ORDER — DEXTROSE 5 % IV SOLN
30.0000 mmol | Freq: Once | INTRAVENOUS | Status: AC
  Administered 2023-08-28: 30 mmol via INTRAVENOUS
  Filled 2023-08-28: qty 10

## 2023-08-28 MED ORDER — DEXTROSE 5 % IV SOLN: INTRAVENOUS | Status: AC

## 2023-08-28 MED ORDER — PANTOPRAZOLE SODIUM 40 MG PO TBEC
40.0000 mg | DELAYED_RELEASE_TABLET | Freq: Every day | ORAL | Status: DC
  Administered 2023-08-29: 40 mg via ORAL
  Filled 2023-08-28: qty 1

## 2023-08-28 MED ORDER — POTASSIUM CHLORIDE 10 MEQ/100ML IV SOLN
10.0000 meq | INTRAVENOUS | Status: AC
  Administered 2023-08-28 (×2): 10 meq via INTRAVENOUS
  Filled 2023-08-28 (×2): qty 100

## 2023-08-28 MED ORDER — POTASSIUM CHLORIDE CRYS ER 20 MEQ PO TBCR
40.0000 meq | EXTENDED_RELEASE_TABLET | ORAL | Status: AC
  Administered 2023-08-28 (×3): 40 meq via ORAL
  Filled 2023-08-28 (×3): qty 2

## 2023-08-28 MED ORDER — SODIUM CHLORIDE 0.9 % IV SOLN
3.0000 g | Freq: Three times a day (TID) | INTRAVENOUS | Status: DC
  Administered 2023-08-28 – 2023-09-01 (×13): 3 g via INTRAVENOUS
  Filled 2023-08-28 (×15): qty 8

## 2023-08-28 MED ORDER — POTASSIUM CHLORIDE CRYS ER 20 MEQ PO TBCR: 40.0000 meq | EXTENDED_RELEASE_TABLET | Freq: Once | ORAL | Status: DC

## 2023-08-28 MED ORDER — STERILE WATER FOR INJECTION IJ SOLN
INTRAMUSCULAR | Status: AC
Start: 2023-08-28 — End: 2023-08-28
  Administered 2023-08-28: 10 mL
  Filled 2023-08-28: qty 10

## 2023-08-28 MED ORDER — POTASSIUM & SODIUM PHOSPHATES 280-160-250 MG PO PACK
2.0000 | PACK | ORAL | Status: DC
  Filled 2023-08-28 (×2): qty 2

## 2023-08-28 MED ORDER — POTASSIUM CHLORIDE CRYS ER 20 MEQ PO TBCR: 40.0000 meq | EXTENDED_RELEASE_TABLET | ORAL | Status: DC

## 2023-08-28 NOTE — Consult Note (Addendum)
 PHARMACY CONSULT NOTE - FOLLOW UP  Pharmacy Consult for Electrolyte Monitoring and Replacement   Recent Labs: Potassium (mmol/L)  Date Value  08/28/2023 2.4 (LL)  12/11/2012 4.8   Magnesium (mg/dL)  Date Value  78/29/5621 1.9   Calcium (mg/dL)  Date Value  30/86/5784 8.2 (L)   Calcium, Total (mg/dL)  Date Value  69/62/9528 8.1 (L)   Albumin (g/dL)  Date Value  41/32/4401 1.7 (L)  12/10/2012 3.3 (L)   Phosphorus (mg/dL)  Date Value  02/72/5366 1.7 (L)   Sodium (mmol/L)  Date Value  08/28/2023 146 (H)  01/06/2015 138  12/11/2012 138   Assessment: 83 y.o. female with a history of COPD, CAD, diastolic CHF, CVA, hypothyroidism, GERD, and dementia who presents with hypotension and atrial fibrillation. Started on amio gtt. Scr is elevated. Pharmacy was consulted to monitor and replace this patient's electrolytes while they're in the ICU.   Goal of Therapy:  Optimize K > 4 ,Mg > 2, all other electrolytes WNL  Plan:  MD dc replacement ordered by pharmacy and ordered own replacement Dc CCM electrolytes monitoring. Per Dr Chipper Herb he will manage electrolytes on medical floor.  Aubrey Voong Rodriguez-Guzman PharmD, BCPS 08/28/2023 8:02 AM

## 2023-08-28 NOTE — Progress Notes (Signed)
 PHARMACIST - PHYSICIAN COMMUNICATION  DR:   Chipper Herb  CONCERNING: IV to Oral Route Change Policy  RECOMMENDATION: This patient is receiving Pantoprazole by the intravenous route.  Based on criteria approved by the Pharmacy and Therapeutics Committee, the intravenous medication(s) is/are being converted to the equivalent oral dose form(s).   DESCRIPTION: These criteria include: The patient is eating (either orally or via tube) and/or has been taking other orally administered medications for a least 24 hours The patient has no evidence of active gastrointestinal bleeding or impaired GI absorption (gastrectomy, short bowel, patient on TNA or NPO).  Kareen Jefferys Rodriguez-Guzman PharmD, BCPS 08/28/2023 10:22 AM

## 2023-08-28 NOTE — Evaluation (Signed)
 Physical Therapy Evaluation Patient Details Name: Robin Graves MRN: [id number] DOB: 26-Apr-1941 Today's Date: 08/28/2023  History of Present Illness  Dorsal Blacketer is a 83 year old female with history of COPD, coronary disease, diastolic congestive heart failure, history of CVA, hypothyroidism, dementia, gastroesophageal reflux disease she was brought to hospital by family member, she had a significant hypotension and atrial fibrillation with RVR.  She was found down at home for about a few hours. She was admitted with septic shock secondary to asiration pneumonia, acute hypoxemic respiratory failure, acute metabolic encephalopathy, acute kidney injury.   Clinical Impression  Patient received reclining in bed and agreeable to PT. She was oriented to self and provided a history somewhat different than documented from previous hospitalization this month. She reports that prior to hospitalization she was I with all aspects of mobility with no AD and denies any recent falls. She reports being I with ADLs and most IADLs but then states her neighbors were helping her with things like getting out of bed and getting food. She lives alone in a 1 story mobile home with 2 steps to enter with bilateral handrails. She uses a walk in shower with a seat and has a RW, SPC, and BSC available at home. She states she does not have anyone to assist her at home except if she goes to stay with her daughter or another relative and she does not want to do this because they are too far away. Upon PT evaluation, patient was able to get in and out of bed mod I but needed min A for rolling during clean up of BM and total A boost up the bed with a draw sheet. She completed sit <> stand transfer to RW and took a few steps forwards with CGA but then stated she felt she needed to have diarrhea and was assisted back to sit on the bed, then elected to lay down while PT obtained necessary equipment to set up the [facility] (pt felt she could not make it  to the bathroom). Patient then was observed to tremble all over her body and stated this happens when she does not feel good. This passed and patient was found to have had a BM, which was cleaned up by PT while patient practiced rolling with min A. Patient demonstrated heavy work of breathing most pronounced when laying in bed. Patient appears to have experienced a significant decline in functional mobility and independence. She currently would require assistance at home for any ambulatory activities due to her limited activity tolerance and balance. Patient would benefit from skilled physical therapy to address impairments and functional limitations (see PT Problem List below) to work towards stated goals and return to PLOF or maximal functional independence.        If plan is discharge home, recommend the following: A little help with walking and/or transfers;A little help with bathing/dressing/bathroom;Help with stairs or ramp for entrance;Assist for transportation;Assistance with cooking/housework   Can travel by private vehicle   Yes    Equipment Recommendations None recommended by PT  Recommendations for Other Services       Functional Status Assessment Patient has had a recent decline in their functional status and demonstrates the ability to make significant improvements in function in a reasonable and predictable amount of time.     Precautions / Restrictions Precautions Precautions: Fall Restrictions Weight Bearing Restrictions Per Provider Order: No      Mobility  Bed Mobility Overal bed mobility: Needs Assistance Bed Mobility: Supine  to Sit, Sit to Supine, Rolling Rolling: Min assist   Supine to sit: Modified independent (Device/Increase time), HOB elevated Sit to supine: Modified independent (Device/Increase time)   General bed mobility comments: completed sit <> supine with increased time/effort but no physical assist. She did need min A for rolling back and forth  during clean up of BM and total A +2 with draw sheet to boost up bed.    Transfers Overall transfer level: Needs assistance Equipment used: Rolling walker (2 wheels) Transfers: Sit to/from Stand Sit to Stand: Contact guard assist           General transfer comment: Patient transfered sit <> stand at edge of bed to RW with CGA for safety.    Ambulation/Gait Ambulation/Gait assistance: Min assist, Contact guard assist Gait Distance (Feet): 3 Feet Assistive device: Rolling walker (2 wheels) Gait Pattern/deviations: Trunk flexed, Decreased stride length, Step-through pattern, Step-to pattern Gait velocity: very slow     General Gait Details: Patient ambulated approx 3 feet fowards and backwards with RW demonstrating stooped posture and short step length. She took a few steps and stated she felt like she was going to have diharreah, so she was assisted back to sit on the edge of her bed while PT went to find the pot for the [facility] and set it up. She only needed CGA when ambulating fowards, but had slight LOB walking backwards towards bed that required min A.  Stairs            Wheelchair Mobility     Tilt Bed    Modified Rankin (Stroke Patients Only)       Balance Overall balance assessment: Needs assistance Sitting-balance support: Feet supported, Bilateral upper extremity supported Sitting balance-Leahy Scale: Good Sitting balance - Comments: patient with steady sitting on edge of bed but fatigues quickly   Standing balance support: During functional activity, Bilateral upper extremity supported, Reliant on assistive device for balance Standing balance-Leahy Scale: Fair Standing balance comment: Patient able to maintain static standing breifly with no UE support but had some unsteadiness when ambulating backwards even with B UE on RW.                             Pertinent Vitals/Pain Pain Assessment Pain Assessment: Faces Faces Pain Scale: Hurts even  more Pain Location: along spine Pain Descriptors / Indicators: Discomfort, Grimacing, Guarding Pain Intervention(s): Limited activity within patient's tolerance, Monitored during session, Repositioned    Home Living Family/patient expects to be discharged to:: Private residence Living Arrangements: Alone Available Help at Discharge: Family;Available PRN/intermittently;Friend(s) (pt states her freinds help her intermittantly but she might be able to stay with a relative, especially her daughter) Type of Home: Mobile home Home Access: Stairs to enter Entrance Stairs-Rails: Right;Left;Can reach both Entrance Stairs-Number of Steps: 2   Home Layout: One level Home Equipment: None;Rolling Walker (2 wheels);Cane - single point;Shower seat;BSC/3in1;Grab bars - tub/shower;Hand held shower head      Prior Function Prior Level of Function : Independent/Modified Independent;Driving             Mobility Comments: patient reports that prior to hospitalization she was I with mobility with no AD. She denies falls in the last 6 months and states she was driving. ADLs Comments: Patient state she was I with ADLs but then said her neighbors help her out with anything that she needs. She said recently they have been helping her get out of  bed and get food.     Extremity/Trunk Assessment   Upper Extremity Assessment Upper Extremity Assessment: Generalized weakness    Lower Extremity Assessment Lower Extremity Assessment: Generalized weakness    Cervical / Trunk Assessment Cervical / Trunk Assessment: Kyphotic  Communication   Communication Communication: No apparent difficulties    Cognition Arousal: Alert Behavior During Therapy: WFL for tasks assessed/performed   PT - Cognitive impairments: No family/caregiver present to determine baseline                       PT - Cognition Comments: has diagnosis of dementia and history provided today is not consistent with history from  past hospitalization earlier this month. Following commands: Intact       Cueing Cueing Techniques: Verbal cues     General Comments      Exercises Other Exercises Other Exercises: Patient participated in practicing rolling and lifting legs with AAROM while PT completed pericare after BM soiled the bed and clothing.   Assessment/Plan    PT Assessment Patient needs continued PT services  PT Problem List Decreased strength;Decreased mobility;Decreased activity tolerance;Cardiopulmonary status limiting activity;Decreased balance;Pain       PT Treatment Interventions DME instruction;Therapeutic exercise;Gait training;Balance training;Stair training;Neuromuscular re-education;Functional mobility training;Therapeutic activities;Patient/family education    PT Goals (Current goals can be found in the Care Plan section)  Acute Rehab PT Goals Patient Stated Goal: to go home PT Goal Formulation: With patient Time For Goal Achievement: 09/11/23 Potential to Achieve Goals: Fair    Frequency Min 1X/week     Co-evaluation               AM-PAC PT "6 Clicks" Mobility  Outcome Measure Help needed turning from your back to your side while in a flat bed without using bedrails?: None Help needed moving from lying on your back to sitting on the side of a flat bed without using bedrails?: None Help needed moving to and from a bed to a chair (including a wheelchair)?: A Little Help needed standing up from a chair using your arms (e.g., wheelchair or bedside chair)?: A Little Help needed to walk in hospital room?: A Little Help needed climbing 3-5 steps with a railing? : A Lot 6 Click Score: 19    End of Session   Activity Tolerance: Patient limited by fatigue;Patient limited by pain Patient left: with call bell/phone within reach;with bed alarm set;in bed Nurse Communication: Mobility status PT Visit Diagnosis: Muscle weakness (generalized) (M62.81);Other abnormalities of gait and  mobility (R26.89)    Time: 6295-2841 PT Time Calculation (min) (ACUTE ONLY): 43 min   Charges:   PT Evaluation $PT Eval Moderate Complexity: 1 Mod PT Treatments $Therapeutic Activity: 8-22 mins PT General Charges $$ ACUTE PT VISIT: 1 Visit         Huntley Dec R. Ilsa Iha, PT, DPT 08/28/23, 7:03 PM

## 2023-08-28 NOTE — TOC Progression Note (Signed)
 Transition of Care [facility]) - Progression Note    Patient Details  Name: Robin Graves MRN: [id number] Date of Birth: [birth year]  [facility] CM/SW Contact  Truddie Hidden, RN Phone Number: 08/28/2023, 4:08 PM  Clinical Narrative:    Patient is currently active with [facility],confirmed by text message received by [facility] from [facility].         Expected Discharge Plan and Services                                               Social Determinants of Health (SDOH) Interventions SDOH Screenings   Food Insecurity: Patient Unable To Answer (08/26/2023)  Housing: Patient Unable To Answer (08/26/2023)  Transportation Needs: Patient Unable To Answer (08/26/2023)  Utilities: Patient Unable To Answer (08/26/2023)  Financial Resource Strain: Low Risk  (08/21/2023)   Received from [facility]  Social Connections: Unknown (08/26/2023)  Tobacco Use: Medium Risk (08/22/2023)    Readmission Risk Interventions     No data to display

## 2023-08-28 NOTE — Progress Notes (Addendum)
 Daily Progress Note   Patient Name: Robin Graves       Date: 08/28/2023 DOB: [birth year]  Age: 83 y.o. MRN#: [id number] Attending Physician: Marrion Coy, MD Primary Care Physician: Barbette Reichmann, MD Admit Date: 08/26/2023  Reason for Consultation/Follow-up: Establishing goals of care  Subjective: Notes and labs reviewed.  In to see patient.  She is currently sitting in bed with no family at bedside.  She states she was not able to discuss wishes on CODE STATUS with her daughter yesterday.  She called her daughter on her cell phone for [place] to speak.  Daughter discusses that [place].   We discussed her diagnoses, prognosis, GOC, EOL wishes disposition and options.  A detailed discussion was had today regarding advanced directives.  Concepts specific to code status, artifical feeding and hydration, IV antibiotics and rehospitalization were discussed.  The difference between an aggressive medical intervention path and a comfort care path was discussed.  Values and goals of care important to patient and family were attempted to be elicited.  Discussed limitations of medical interventions to prolong quality of life in some situations and discussed the concept of human mortality.  Patient desires DNR/DNI status.  Daughter very much on board with this decision.  Patient wants to go home as soon as possible.  Daughter discusses concerns that this is not safe as she lives alone at home.  She states at home, her mother does not really eat or drink and her status declines.  Patient would not want a feeding tube.  Daughter would like to speak with hospice liaison about initiation of hospice services after discharge.  Discussed completing a MOST form.  Patient states she would like her daughter  to complete the form as she does not feel up to doing so.  She directs me to speak with her daughter about further decisions.  I completed a MOST form today with daughter through [place], with patient present. Each section of options on the form were reviewed in full detail and any questions were answered as needed. The patient and daughter outlined their wishes for the following treatment decisions:  Cardiopulmonary Resuscitation: Do Not Attempt Resuscitation (DNR/No CPR)  Medical Interventions: Limited Additional Interventions: Use medical treatment, IV fluids and cardiac monitoring as indicated, DO NOT USE intubation or mechanical ventilation.  May consider use of less invasive airway support such as BiPAP or CPAP. Also provide comfort measures. Transfer to the hospital if indicated. Avoid intensive care.   Antibiotics: Determine use of limitation of antibiotics when infection occurs  IV Fluids: IV fluids for a defined trial period  Feeding Tube: No feeding tube     Length of Stay: 2  Current Medications: Scheduled Meds:   amiodarone  400 mg Oral BID   Followed by   Melene Muller ON 09/06/2023] amiodarone  200 mg Oral Daily   apixaban  2.5 mg Oral BID   aspirin EC  81 mg Oral Daily   budesonide (PULMICORT) nebulizer solution  0.5 mg Nebulization BID   Chlorhexidine Gluconate Cloth  6 each Topical QHS   feeding supplement  237 mL Oral BID BM   levothyroxine  50 mcg Oral Once per day on Monday Tuesday Wednesday Thursday Friday   [START ON 08/31/2023] levothyroxine  75 mcg Oral Once per day on Sunday Saturday   multivitamin with minerals  1 tablet Oral Daily   [START ON 08/29/2023] pantoprazole  40 mg Oral Daily   potassium chloride  40 mEq Oral Q2H   revefenacin  175 mcg Nebulization Daily   sodium chloride flush  3 mL Intravenous Q12H    Continuous Infusions:  potassium PHOSPHATE IVPB (in mmol)      PRN Meds: acetaminophen, docusate sodium, ipratropium-albuterol, ondansetron (ZOFRAN) IV,  mouth rinse, polyethylene glycol, sodium chloride flush  Physical Exam Pulmonary:     Effort: Pulmonary effort is normal.  Neurological:     Mental Status: She is alert.             Vital Signs: BP 137/75   Pulse 82   Temp 97.8 F (36.6 C) (Oral)   Resp 20   Ht 4\' 9"  (1.448 m)   Wt 37.7 kg   SpO2 94%   BMI 17.99 kg/m  SpO2: SpO2: 94 % O2 Device: O2 Device: Room Air O2 Flow Rate: O2 Flow Rate (L/min): 2 L/min  Intake/output summary:  Intake/Output Summary (Last 24 hours) at 08/28/2023 1155 Last data filed at 08/28/2023 0800 Gross per 24 hour  Intake 349.69 ml  Output 200 ml  Net 149.69 ml   LBM: Last BM Date : 08/27/23 Baseline Weight: Weight: 37.8 kg Most recent weight: Weight: 37.7 kg  Patient Active Problem List   Diagnosis Date Noted   Hypokalemia 08/28/2023   Hypophosphatemia 08/28/2023   Thrombocytopenia (HCC) 08/28/2023   Hypernatremia 08/28/2023   Cardiogenic shock (HCC) 08/26/2023   Diarrhea 08/23/2023   Esophagitis 08/22/2023   COPD (chronic obstructive pulmonary disease) (HCC) 08/22/2023   Dementia with behavioral disturbance (HCC) 08/22/2023   Protein-calorie malnutrition, severe (HCC) 08/22/2023   Leukocytosis 08/22/2023   Chronic diastolic CHF (congestive heart failure) (HCC) 08/14/2023   Pancytopenia (HCC) 08/14/2023   COPD with acute exacerbation (HCC) 08/14/2023   COPD with acute bronchitis (HCC) 08/14/2023   Influenza A 08/14/2023   Mass of upper lobe of right lung 08/14/2023   Tobacco use disorder 08/27/2016   S/P cardiac catheterization 03/01/2016   History of Graves' disease 02/22/2016   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  No feeding tube. Daughter would like to speak with hospice liaison about hospice initiation at discharge.    Code Status:    Code Status Orders  (From admission, onward)           Start     Ordered   08/28/23 1151  Do not attempt resuscitation (DNR)- Limited -Do Not Intubate (DNI)  (Code Status)  Continuous       Question Answer Comment  If pulseless and not breathing No CPR or chest compressions.   In Pre-Arrest Conditions (Patient Is Breathing and Has A Pulse) Do not intubate. Provide all appropriate non-invasive medical interventions. Avoid ICU transfer unless indicated or required.   Consent: Discussion documented in EHR or advanced directives reviewed      08/28/23 1150           Code Status History     Date Active Date Inactive Code Status Order ID Comments User Context   08/26/2023 1057 08/28/2023 1150 Full Code [id number]  Erin Fulling, MD ED   08/22/2023 2044 08/23/2023 2214 Full Code [id number]  Lorretta Harp, MD ED   08/14/2023 0232 08/15/2023 1709 Full Code [id number]  Andris Baumann, MD ED   11/08/2016 1455 11/08/2016 1844 Full Code [id number]  Kennedy Bucker, MD Inpatient       Prognosis: Poor overall   Care plan was discussed with [facility] and attending via epic chat.   Thank you for allowing the Palliative Medicine Team to assist in the care of this patient.     Morton Stall, NP  Please contact Palliative Medicine Team phone at [phone] for questions and concerns.

## 2023-08-28 NOTE — Progress Notes (Signed)
 [facility] CLINIC CARDIOLOGY PROGRESS NOTE       Patient ID: Robin Graves MRN: [id number] DOB/AGE: Jan 30, 1941 83 y.o.  Admit date: 08/26/2023 Referring Physician Dr. Dionne Bucy Primary Physician Barbette Reichmann, MD  Primary Cardiologist Dr. Darrold Junker Reason for Consultation atrial fibrillation RVR  HPI: Robin Graves is a 83 y.o. female  with a past medical history of COPD, CAD, diastolic CHF, CVA, hypothyroidism, GERD, and dementia who presents with hypotension and atrial fibrillation who presented to the ED on 08/26/2023 for hypotension.  EMS was called to her home after family had not heard from her in a few days.  Found to be hypotensive and in A-fib RVR with heart rate in the 170s.  Cardiology was consulted for further evaluation.   Interval history: -Patient seen and examined this AM, she feels well overall. -Denies any CP, SOB, palpitations.  -Remaining in NSR with well controlled HR.  -BP slightly elevated overnight.  Tolerating medications well.  Review of systems complete and found to be negative unless listed above    Past Medical History:  Diagnosis Date   Anemia    Arthritis    BACK   Chicken pox    Colon polyps    COPD (chronic obstructive pulmonary disease) (HCC)    Diverticulosis    Emphysema of lung (HCC)    GERD (gastroesophageal reflux disease)    Graves disease    Graves' disease with exophthalmos    REMOVED ON THYROID MEDS   Headache    MIGRAINES, 1 EVERY DAY OR LESS OFTEN   HH (hiatus hernia)    Hyperlipidemia    Pernicious anemia    Stroke (HCC)    MINI STROKES 1995 A FEW BEFORE 1995   Wears dentures    UPPER AND LOWER    Past Surgical History:  Procedure Laterality Date   ABDOMINAL HYSTERECTOMY  1974   AUGMENTATION MAMMAPLASTY Bilateral 1976   silicone   AUGMENTATION MAMMAPLASTY Bilateral 1996   saline   BACK SURGERY  JUNE,4,2015   HURT AT WORK   BREAST SURGERY Bilateral , 1973   history of leakage   CATARACT EXTRACTION W/PHACO  Right 04/20/2015   Procedure: CATARACT EXTRACTION PHACO AND INTRAOCULAR LENS PLACEMENT (IOC);  Surgeon: Lockie Mola, MD;  Location: [facility];  Service: Ophthalmology;  Laterality: Right;   CATARACT EXTRACTION W/PHACO Left 01/23/2017   Procedure: CATARACT EXTRACTION PHACO AND INTRAOCULAR LENS PLACEMENT (IOC)  Left;  Surgeon: Lockie Mola, MD;  Location: [facility];  Service: Ophthalmology;  Laterality: Left;   CHOLECYSTECTOMY     COLONOSCOPY WITH PROPOFOL N/A 05/16/2015   Procedure: COLONOSCOPY WITH PROPOFOL;  Surgeon: Elnita Maxwell, MD;  Location: [facility] END[facility];  Service: Endoscopy;  Laterality: N/A;   ESOPHAGOGASTRODUODENOSCOPY (EGD) WITH PROPOFOL N/A 05/16/2015   Procedure: ESOPHAGOGASTRODUODENOSCOPY (EGD) WITH PROPOFOL;  Surgeon: Elnita Maxwell, MD;  Location: [facility] ENDOSCOPY;  Service: Endoscopy;  Laterality: N/A;   EYE SURGERY     KYPHOPLASTY  12/10/2013   T12   KYPHOPLASTY N/A 11/08/2016   Procedure: KYPHOPLASTY- T9;  Surgeon: Kennedy Bucker, MD;  Location: [facility] ORS;  Service: Orthopedics;  Laterality: N/A;   MASTECTOMY Bilateral 1970's   subcutaneous mastectomy done for preventative reasons with reconstruction   thyroid ablation      Medications Prior to Admission  Medication Sig Dispense Refill Last Dose/Taking   albuterol (VENTOLIN HFA) 108 (90 Base) MCG/ACT inhaler Inhale 2 puffs into the lungs every 6 (six) hours as needed for wheezing or shortness of breath.  8 g 2 08/25/2023   aspirin EC 81 MG tablet Take 81 mg by mouth daily. AM   Past Week   cyanocobalamin (,VITAMIN B-12,) 1000 MCG/ML injection INJECT 1 ML AS DIRECTED MONTHLY 10 mL 12 08/25/2023   donepezil (ARICEPT) 5 MG tablet Take 5 mg by mouth in the morning.   08/25/2023   feeding supplement (ENSURE ENLIVE / ENSURE PLUS) LIQD Take 237 mLs by mouth 2 (two) times daily between meals.   08/25/2023   ferrous sulfate 325 (65 FE) MG tablet Take 1 tablet (325 mg total) by mouth daily with  breakfast. 30 tablet 7 Past Week   ipratropium-albuterol (DUONEB) 0.5-2.5 (3) MG/3ML SOLN Take 3 mLs by nebulization every 6 (six) hours as needed. 120 mL 2 Taking As Needed   levothyroxine (SYNTHROID) 50 MCG tablet Take 50 mcg by mouth daily before breakfast. Monday- Friday Patient takes 50 mcg   08/25/2023   levothyroxine (SYNTHROID, LEVOTHROID) 75 MCG tablet Take 75 mcg by mouth daily before breakfast. Saturday-Sunday   Past Week   pantoprazole (PROTONIX) 40 MG tablet Take 1 tablet (40 mg total) by mouth 2 (two) times daily. 60 tablet 2 Past Week   potassium chloride (K-DUR,KLOR-CON) 10 MEQ tablet Take 10 mEq by mouth 2 (two) times daily.   Past Week   pravastatin (PRAVACHOL) 80 MG tablet Take 1 tablet (80 mg total) by mouth daily. 30 tablet 7 08/25/2023   Social History   Socioeconomic History   Marital status: Widowed    Spouse name: Not on file   Number of children: Not on file   Years of education: Not on file   Highest education level: Not on file  Occupational History   Not on file  Tobacco Use   Smoking status: Former    Current packs/day: 0.00    Average packs/day: 0.3 packs/day for 4.0 years (1.0 ttl pk-yrs)    Types: Cigarettes    Start date: 04/23/2004    Quit date: 04/23/2008    Years since quitting: 15.3   Smokeless tobacco: Never   Tobacco comments:    was still having occ cig until 07/2016  Substance and Sexual Activity   Alcohol use: No   Drug use: No   Sexual activity: Not on file  Other Topics Concern   Not on file  Social History Narrative   Not on file   Social Drivers of Health   Financial Resource Strain: Low Risk  (08/21/2023)   Received from [facility]   Overall Financial Resource Strain (CARDIA)    Difficulty of Paying Living Expenses: Not hard at all  Food Insecurity: Patient Unable To Answer (08/26/2023)   Hunger Vital Sign    Worried About Running Out of Food in the Last Year: Patient unable to answer    Ran Out of Food in  the Last Year: Patient unable to answer  Transportation Needs: Patient Unable To Answer (08/26/2023)   PRAPARE - Transportation    Lack of Transportation (Medical): Patient unable to answer    Lack of Transportation (Non-Medical): Patient unable to answer  Physical Activity: Not on file  Stress: Not on file  Social Connections: Unknown (08/26/2023)   Social Connection and Isolation Panel [NHANES]    Frequency of Communication with Friends and Family: Patient unable to answer    Frequency of Social Gatherings with Friends and Family: Patient unable to answer    Attends Religious Services: Patient unable to answer    Active Member of Clubs or  Organizations: Yes    Attends Banker Meetings: Patient unable to answer    Marital Status: Patient unable to answer  Intimate Partner Violence: Unknown (08/26/2023)   Humiliation, Afraid, Rape, and Kick questionnaire    Fear of Current or Ex-Partner: Patient unable to answer    Emotionally Abused: Patient unable to answer    Physically Abused: No    Sexually Abused: Patient unable to answer    Family History  Problem Relation Age of Onset   Coronary artery disease Mother    Stroke Father    Heart attack Father    Cancer Brother        colon   Stroke Brother    Heart attack Brother    Diabetes Brother      Vitals:   08/28/23 0518 08/28/23 0744 08/28/23 0815 08/28/23 0816  BP:   137/75 137/75  Pulse:    82  Resp:   20   Temp:   97.8 F (36.6 C)   TempSrc:   Oral   SpO2:  96% 93% 94%  Weight: 37.7 kg     Height:        PHYSICAL EXAM General: Chronically ill-appearing elderly female, well nourished, in no acute distress. HEENT: Normocephalic and atraumatic. Neck: No JVD.  Lungs: Normal respiratory effort on supplemental O2. Clear bilaterally to auscultation. No wheezes, crackles, rhonchi.  Heart: HRRR. Normal S1 and S2 without gallops or murmurs.  Abdomen: Non-distended appearing.  Msk: Normal strength and tone for  age. Extremities: Warm and well perfused. No clubbing, cyanosis.  No edema.  Neuro: Alert and oriented X 3. Psych: Answers questions appropriately.   Labs: Basic Metabolic Panel: Recent Labs    08/27/23 0450 08/28/23 0645  NA 146* 146*  K 3.8 2.4*  CL 121* 118*  CO2 16* 20*  GLUCOSE 67* 99  BUN 35* 22  CREATININE 0.83 0.76  CALCIUM 7.9* 8.2*  MG 2.0 1.9  PHOS 1.7* 1.7*   Liver Function Tests: Recent Labs    08/26/23 0704 08/27/23 0450 08/28/23 0645  AST 31  --   --   ALT 27  --   --   ALKPHOS 93  --   --   BILITOT 0.8  --   --   PROT 5.3*  --   --   ALBUMIN 1.8* 1.9* 1.7*   No results for input(s): "LIPASE", "AMYLASE" in the last 72 hours. CBC: Recent Labs    08/26/23 0704 08/27/23 0450 08/28/23 0645  WBC 14.5* 9.3 10.9*  NEUTROABS 13.2*  --   --   HGB 9.0* 9.6* 9.8*  HCT 28.1* 30.6* 29.1*  MCV 98.3 97.5 91.5  PLT 149* 137* 148*   Cardiac Enzymes: Recent Labs    08/26/23 0704 08/26/23 1032 08/26/23 1613 08/26/23 1742 08/27/23 0450  CKTOTAL 62  --   --   --   --   TROPONINIHS 24*   < > 649* 763* 638*   < > = values in this interval not displayed.   BNP: Recent Labs    08/26/23 0704  BNP 402.6*   D-Dimer: No results for input(s): "DDIMER" in the last 72 hours. Hemoglobin A1C: No results for input(s): "HGBA1C" in the last 72 hours. Fasting Lipid Panel: No results for input(s): "CHOL", "HDL", "LDLCALC", "TRIG", "CHOLHDL", "LDLDIRECT" in the last 72 hours. Thyroid Function Tests: Recent Labs    08/26/23 2203  TSH 1.469   Anemia Panel: No results for input(s): "VITAMINB12", "FOLATE", "FERRITIN", "TIBC", "IRON", "RETICCTPCT"  in the last 72 hours.   Radiology: ECHOCARDIOGRAM COMPLETE Result Date: 08/26/2023    ECHOCARDIOGRAM REPORT   Patient Name:   Robin Graves Date of Exam: 08/26/2023 Medical Rec #:  [id number]   Height:       60.0 in Accession #:    [id number]  Weight:       92.8 lb Date of Birth:  08/03/1940   BSA:          1.347 m Patient  Age:    82 years    BP:           95/57 mmHg Patient Gender: F           HR:           92 bpm. Exam Location:  [facility] Procedure: 2D Echo, Cardiac Doppler and Color Doppler (Both Spectral and Color            Flow Doppler were utilized during procedure). Indications:     Shock R57.9  History:         Patient has no prior history of Echocardiogram examinations.                  COPD and Stroke.  Sonographer:     Cristela Blue Referring Phys:  [id number] Eladia Frame Diagnosing Phys: Windell Norfolk  Sonographer Comments: Technically challenging study due to limited acoustic windows. Image acquisition challenging due to COPD. IMPRESSIONS  1. Technically difficult study.  2. Left ventricular ejection fraction, by estimation, is 60 to 65%. The left ventricle has normal function. The left ventricle has no regional wall motion abnormalities. There is mild left ventricular hypertrophy. Left ventricular diastolic parameters are indeterminate.  3. Right ventricular systolic function is normal. The right ventricular size is normal.  4. Moderate mitral valve regurgitation.  5. The aortic valve was not well visualized. There is moderate calcification of the aortic valve. Aortic valve regurgitation is not visualized. Mild aortic valve stenosis. FINDINGS  Left Ventricle: Left ventricular ejection fraction, by estimation, is 60 to 65%. The left ventricle has normal function. The left ventricle has no regional wall motion abnormalities. Strain imaging was not performed. The left ventricular internal cavity  size was normal in size. There is mild left ventricular hypertrophy. Left ventricular diastolic parameters are indeterminate. Right Ventricle: The right ventricular size is normal. No increase in right ventricular wall thickness. Right ventricular systolic function is normal. Left Atrium: Left atrial size was normal in size. Right Atrium: Right atrial size was normal in size. Pericardium: There is no evidence of pericardial effusion.  Mitral Valve: There is mild thickening of the mitral valve leaflet(s). Mild mitral annular calcification. Moderate mitral valve regurgitation. MV peak gradient, 4.2 mmHg. The mean mitral valve gradient is 3.0 mmHg. Tricuspid Valve: The tricuspid valve is not well visualized. Tricuspid valve regurgitation is mild. Aortic Valve: Peak velocity 2.4 m/s, Mean gradient 13 mmHg, Dimensionless index 0.50. The aortic valve was not well visualized. There is moderate calcification of the aortic valve. Aortic valve regurgitation is not visualized. Mild aortic stenosis is present. Aortic valve mean gradient measures 9.2 mmHg. Aortic valve peak gradient measures 16.6 mmHg. Aortic valve area, by VTI measures 1.90 cm. Pulmonic Valve: The pulmonic valve was not well visualized. Pulmonic valve regurgitation is trivial. Aorta: The aortic root is normal in size and structure. Venous: The inferior vena cava was not well visualized. IAS/Shunts: The interatrial septum was not well visualized. Additional Comments: 3D imaging was not performed.  LEFT VENTRICLE  PLAX 2D LVIDd:         2.20 cm   Diastology LVIDs:         1.50 cm   LV e' medial:    7.62 cm/s LV PW:         0.90 cm   LV E/e' medial:  11.5 LV IVS:        1.10 cm   LV e' lateral:   11.40 cm/s LVOT diam:     2.00 cm   LV E/e' lateral: 7.7 LV SV:         68 LV SV Index:   50 LVOT Area:     3.14 cm  RIGHT VENTRICLE RV Basal diam:  2.90 cm RV Mid diam:    3.20 cm RV S prime:     13.60 cm/s TAPSE (M-mode): 1.9 cm LEFT ATRIUM           Index        RIGHT ATRIUM          Index LA diam:      2.80 cm 2.08 cm/m   RA Area:     7.09 cm LA Vol (A2C): 9.1 ml  6.76 ml/m   RA Volume:   10.30 ml 7.65 ml/m LA Vol (A4C): 18.1 ml 13.44 ml/m  AORTIC VALVE AV Area (Vmax):    1.80 cm AV Area (Vmean):   1.96 cm AV Area (VTI):     1.90 cm AV Vmax:           204.00 cm/s AV Vmean:          137.750 cm/s AV VTI:            0.358 m AV Peak Grad:      16.6 mmHg AV Mean Grad:      9.2 mmHg LVOT Vmax:          117.00 cm/s LVOT Vmean:        86.100 cm/s LVOT VTI:          0.216 m LVOT/AV VTI ratio: 0.60  AORTA Ao Root diam: 2.90 cm MITRAL VALVE                  TRICUSPID VALVE MV Area (PHT): 3.76 cm       TR Peak grad:   20.8 mmHg MV Area VTI:   2.94 cm       TR Vmax:        228.00 cm/s MV Peak grad:  4.2 mmHg MV Mean grad:  3.0 mmHg       SHUNTS MV Vmax:       1.02 m/s       Systemic VTI:  0.22 m MV Vmean:      78.2 cm/s      Systemic Diam: 2.00 cm MV Decel Time: 202 msec MR Peak grad:    99.2 mmHg MR Mean grad:    51.0 mmHg MR Vmax:         498.00 cm/s MR Vmean:        326.0 cm/s MR PISA:         1.01 cm MR PISA Eff ROA: 6 mm MR PISA Radius:  0.40 cm MV E velocity: 88.00 cm/s MV A velocity: 100.00 cm/s MV E/A ratio:  0.88 Windell Norfolk Electronically signed by Windell Norfolk Signature Date/Time: 08/26/2023/5:38:03 PM    Final    DG Chest Port 1 View Result Date: 08/26/2023 CLINICAL DATA:  Atrial fibrillation.  Unresponsive. EXAM: PORTABLE CHEST  1 VIEW COMPARISON:  08/22/2023 FINDINGS: Marked thoracolumbar scoliosis. Cardiopericardial silhouette is at upper limits of normal for size. Diffuse interstitial and patchy/nodular bilateral airspace disease appears progressive in the interval, potentially secondary to edema given the relatively rapid progression. Diffuse infection not excluded. Nodular right parahilar density seen previously is again noted. Bones are diffusely demineralized. Telemetry leads overlie the chest. IMPRESSION: 1. Progressive diffuse interstitial and patchy/nodular bilateral airspace disease, potentially secondary to edema given the relatively rapid progression. Diffuse infection not excluded. Close follow-up recommended to ensure resolution. 2. Nodular right parahilar density seen previously is again noted. Electronically Signed   By: Kennith Center M.D.   On: 08/26/2023 08:11   DG Chest 2 View Result Date: 08/22/2023 CLINICAL DATA:  Increasing pain in mouth, esophagus, and stomach for 1  week, dysphagia EXAM: CHEST - 2 VIEW COMPARISON:  08/14/2023, 08/13/2023 FINDINGS: Frontal and lateral views of the chest demonstrate a stable cardiac silhouette. The right upper lobe mass seen on prior CT is faintly apparent adjacent to the right hilum on the frontal view, concerning for malignancy based on previous CT imaging. The subcentimeter left upper lobe nodule seen on prior CT is not well visualized by x-ray. Stable asymmetric right apical pleural thickening, likely benign scarring given long-term stability. No acute airspace disease, effusion, or pneumothorax. Stable thoracic compression deformities with prior vertebral augmentations. IMPRESSION: 1. Stable right upper lobe perihilar mass concerning for malignancy based on previous CT findings. Subcentimeter left upper lobe nodule seen on prior CT is not visualized by x-ray. PET scan again recommended if not performed in the interim. 2. No acute airspace disease. Electronically Signed   By: Sharlet Salina M.D.   On: 08/22/2023 15:34   CT Angio Chest PE W and/or Wo Contrast Result Date: 08/14/2023 CLINICAL DATA:  Shortness of breath, abdominal pain, positive flu test on 4 days ago. Also, PA and lateral chest yesterday demonstrated suspected mass in the retrosternal clear space on the lateral view only. EXAM: CT ANGIOGRAPHY CHEST WITH CONTRAST TECHNIQUE: Multidetector CT imaging of the chest was performed using the standard protocol during bolus administration of intravenous contrast. Multiplanar CT image reconstructions and MIPs were obtained to evaluate the vascular anatomy. RADIATION DOSE REDUCTION: This exam was performed according to the departmental dose-optimization program which includes automated exposure control, adjustment of the mA and/or kV according to patient size and/or use of iterative reconstruction technique. CONTRAST:  75mL OMNIPAQUE IOHEXOL 350 MG/ML SOLN COMPARISON:  PA and lateral chest yesterday, and chest, abdomen and pelvis CT with  IV contrast 04/29/2020. FINDINGS: Cardiovascular: The cardiac size is normal. There is no pericardial effusion. Three-vessel coronary artery calcifications greatest in the LAD, additional scattered calcification in the left main coronary artery. There is moderate calcification and thickening of the aortic valve leaflets. Consider echocardiographic evaluation. The aorta is normal in caliber with mild tortuosity and moderate calcific plaques, with atherosclerosis in the great vessels. Pulmonary arteries are normal in caliber without evidence of thromboemboli. The pulmonary veins are slightly distended but no more than previously. Mediastinum/Nodes: No enlarged mediastinal, hilar, or axillary lymph nodes. Thyroid gland, trachea, and esophagus demonstrate no significant findings. There is a small hiatal hernia. Lungs/Pleura: There is right-greater-than-left biapical pleural-parenchymal scarring, with right apical volume loss and upward hilar retraction and left apical linear calcifications within the scarring. On the right scarring changes merge with coarsely nodular chronic scar-like opacities. There are mild centrilobular emphysematous changes in the lungs. Anteriorly in the base of the right upper lobe there is  new demonstration of a lobular solid nodule with pleural stranding measuring 2.1 x 2 x 1.8 cm (measured on 5:56 and 7:32). This is highly worrisome for a primary bronchogenic neoplasm. PET-CT or tissue sampling recommended. In the left upper lobe a new nodule is also noted on 5:34, with slight cavitation and measuring 8 x 6 mm. This could be a neoplasm or an inflammatory nodule. In the left lower lobe, there is a chronic stable 4 mm nodule on 5:97. There are scattered linear scar-like opacities in the bases. There is no consolidation, effusion or further nodules. Upper Abdomen: No acute abnormality. Status post cholecystectomy with chronically prominent common bile duct. Abdominal aortic atherosclerosis.  Musculoskeletal: There are multilevel thoracic spine chronic compression fractures, with old kyphoplasty at T9 and 12. Osteopenia and degenerative change with thoracic kyphosis. No new or progressive thoracic compression fractures. The ribcage is intact. No destructive bone lesion. Bilateral breast implants noted with chronic collapse of the left implant. Review of the MIP images confirms the above findings. IMPRESSION: 1. No evidence of arterial dilatation or thromboembolism. 2. 2.1 x 2 x 1.8 cm lobular solid nodule with pleural stranding in the base of the right upper lobe anteriorly, highly worrisome for a primary bronchogenic neoplasm. PET-CT or tissue sampling recommended. 3. Additional new 8 x 6 mm cavitary nodule in the left upper lobe, could be a neoplasm or an inflammatory nodule. 4. Emphysema. 5. Aortic and coronary artery atherosclerosis. 6. Moderate calcification and thickening of the aortic valve leaflets. Consider echocardiographic evaluation. 7. Small hiatal hernia. 8. Osteopenia and degenerative change with multilevel thoracic spine chronic compression fractures, with old kyphoplasty at T9 and 12. 9. 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 Aortic Atherosclerosis (ICD10-I70.0) and Emphysema (ICD10-J43.9). Electronically Signed   By: Almira Bar M.D.   On: 08/14/2023 01:21   DG Chest 2 View Result Date: 08/13/2023 CLINICAL DATA:  Shortness of breath. EXAM: CHEST - 2 VIEW COMPARISON:  07/04/2013.  Chest CT dated 04/29/2020. FINDINGS: Enlarged cardiac silhouette. Hyperexpanded lungs. Interval nodular density overlying the anterior upper thorax on the lateral view, not seen on the frontal view. No significant change in right greater than left biapical pleural and parenchymal scarring. Multiple thoracic and lumbar compression deformities are again demonstrated with kyphoplasty at 2 levels. Diffuse osteopenia. Mild-to-moderate dextroconvex thoracolumbar scoliosis. Cholecystectomy clips.  Atheromatous aortic calcifications. IMPRESSION: 1. Interval nodular density overlying the anterior upper thorax on the lateral view, not seen on the frontal view. Recommend chest CT for further evaluation. 2. COPD. 3. Cardiomegaly. Electronically Signed   By: Beckie Salts M.D.   On: 08/13/2023 19:39    ECHO as above  TELEMETRY reviewed by me 08/28/2023: Sinus rhythm rate 80-90s  EKG reviewed by me: Atrial fibrillation rate 119 bpm  Data reviewed by me 08/28/2023: last 24h vitals tele labs imaging I/O hospitalist progress note  Principal Problem:   Cardiogenic shock (HCC) Active Problems:   Chronic diastolic CHF (congestive heart failure) (HCC)   Mass of upper lobe of right lung   COPD (chronic obstructive pulmonary disease) (HCC)   Dementia with behavioral disturbance (HCC)   Protein-calorie malnutrition, severe (HCC)   Hypokalemia   Hypophosphatemia   Thrombocytopenia (HCC)   Hypernatremia    ASSESSMENT AND PLAN:  Robin Graves is a 83 y.o. female  with a past medical history of COPD, CAD, diastolic CHF, CVA, hypothyroidism, GERD, and dementia who presents with hypotension and atrial fibrillation who presented to the ED on 08/26/2023 for hypotension.  EMS  was called to her home after family had not heard from her in a few days.  Found to be hypotensive and in A-fib RVR with heart rate in the 170s.  Cardiology was consulted for further evaluation.   # Atrial fibrillation RVR # NSTEMI, suspect type II # Shock, unclear etiology Patient found down in her home by EMS with significant hypotension and in atrial fibrillation RVR.  She was cardioverted x 4 in the ED and started on IV amiodarone, now maintaining in normal sinus rhythm.  Troponins trended 24 > 159 > 540 > 649 > 763 > 638.  She is without chest pain or anginal symptoms, EKG without acute ST-T changes. Echo with preserved EF, no WMAs.  -Continue eliquis 2.5 mg twice daily for stroke risk reduction (dose appropriate given age >82 and  weight <60 kg.  Continue aspirin 81 mg daily. -Continue amiodarone 400 mg twice daily for 9 more days then will decrease to 200 mg daily. -No plan for further cardiac diagnostics at this time.   -Would recommend repeat echocardiogram in about 1 month after discharge.  Ok for discharge today from a cardiac perspective. Will arrange for follow up in clinic with Dr. Darrold Junker in 1-2 weeks.    This patient's plan of care was discussed and created with Dr. Corky Sing and he is in agreement.  Signed: Gale Journey, PA-C  08/28/2023, 9:38 AM [facility]

## 2023-08-28 NOTE — Progress Notes (Signed)
 Progress Note   Patient: Robin Graves:[id number] DOB: 1941-02-09 DOA: 08/26/2023     2 DOS: the patient was seen and examined on 08/28/2023   Brief hospital course: Robin Graves is a 83 year old female with history of COPD, coronary disease, diastolic congestive heart failure, history of CVA, hypothyroidism, dementia, gastroesophageal reflux disease she was brought to hospital by family member, she had a significant hypotension and atrial fibrillation with RVR.  She was found down at home for about a few hours.  Patient was given fluids, patient was also placed on phenylephrine.  Antibiotics was started.   Principal Problem:   Cardiogenic shock (HCC) Active Problems:   COPD (chronic obstructive pulmonary disease) (HCC)   Chronic diastolic CHF (congestive heart failure) (HCC)   Mass of upper lobe of right lung   Dementia with behavioral disturbance (HCC)   Protein-calorie malnutrition, severe (HCC)   Hypokalemia   Hypophosphatemia   Thrombocytopenia (HCC)   Hypernatremia   Septic shock (HCC)   Aspiration pneumonia of both lower lobes due to gastric secretions (HCC)   Assessment and Plan: Septic shock secondary to aspiration pneumonia. Cardiogenic shock ruled out. Aspiration pneumonia bilateral lower lobes. Dysphagia. Acute hypoxemic respiratory failure secondary to aspiration pneumonia. Acute metabolic encephalopathy. Patient came to the hospital with tachycardia, tachypnea and hypotension.  She also had a severe leukocytosis with white cell count of 23.5, lactic acidosis of 2.4. Initial chest x-ray did not show acute changes, repeat chest x-ray after fluids showed bilateral lower lobe diffuse opacities.  Patient also has history of dysphagia. Patient was also having significant confusion and agitation at the time of admission, seems to be better today. Patient condition is consistent with aspiration pneumonia causing septic shock and acute respiratory failure. Blood cultures so  far no growth, MRSA culture negative. Will continue Unasyn for 5 days.  Acute kidney injury secondary to septic shock and dehydration. Hypernatremia secondary to dehydration. Severe hypokalemia. Hypophosphatemia. Normal anion gap metabolic acidosis. Aggressively replace potassium and phosphate. Sodium level 146, recheck potassium level at 4 PM, may consider restart D5 water with KCl to bring down the sodium level.  Elevated troponin secondary to septic shock. Seen by cardiology, no additional workup.  Thrombocytopenia secondary to septic shock. Anemia of chronic disease. Continue to follow.  Hypoglycemia secondary to poor p.o. intake. Failure to thrive. Severe protein calorie malnutrition. Continue to follow.  Dementia History of CVA. Continue to follow.  Prognosis. Long-term prognosis very poor, seen by palliative care, recommended hospice care after discharge.  But will treat the treatable while in the hospital.    Subjective:  Patient seems to be doing better today, she has some confusion, but no agitation.  She has some short of breath, she has a cough.  Physical Exam: Vitals:   08/28/23 0744 08/28/23 0815 08/28/23 0816 08/28/23 1211  BP:  137/75 137/75 118/61  Pulse:   82 88  Resp:  20  18  Temp:  97.8 F (36.6 C)  98.4 F (36.9 C)  TempSrc:  Oral  Oral  SpO2: 96% 93% 94% 94%  Weight:      Height:       General exam: Appears calm and comfortable, severely malnourished. Respiratory system: Clear to auscultation. Respiratory effort normal. Cardiovascular system: S1 & S2 heard, RRR. No JVD, murmurs, rubs, gallops or clicks. No pedal edema. Gastrointestinal system: Abdomen is nondistended, soft and nontender. No organomegaly or masses felt. Normal bowel sounds heard. Central nervous system: Alert and oriented x2. No focal neurological  deficits. Extremities: Severe muscle atrophy. Skin: No rashes, lesions or ulcers Psychiatry: Mood & affect appropriate.     Data Reviewed:  Reviewed chest x-ray results, images, lab results.  Family Communication: Daughter updated over the phone.  Disposition: Status is: Inpatient Remains inpatient appropriate because: Severity of disease, IV treatment.   Discussed with daughter, patient does not have a safe home to go to.  Will need nursing home placement with palliative care.     Time spent: 50 minutes  Author: Marrion Coy, MD 08/28/2023 2:20 PM  For on call review [email or web address].

## 2023-08-28 NOTE — Plan of Care (Signed)

## 2023-08-28 NOTE — Progress Notes (Signed)
 PT Cancellation Note  Patient Details Name: KLAIR LEISING MRN: [id number] DOB: January 16, 1941   Cancelled Treatment:    Reason Eval/Treat Not Completed: Patient not medically ready (PT order received, chart reviewed, K+ critical low upon last measurement. Hold until it improves. Will re-attempt PT eval at later time/date as appropriate.)  Luretha Murphy. Ilsa Iha, PT, DPT 08/28/23, 5:02 PM  [facility] [address] [place], [place] [address] P: [phone] I F: [phone]

## 2023-08-28 NOTE — Progress Notes (Signed)
 0800 patient alert x4 on room air able to make all needs known patient complaining of pain in mid chest after having to drink or swallow anything patient not wanting to eat or drink 1000 patient refusing to get into chair at this time

## 2023-08-28 NOTE — Progress Notes (Addendum)
 Speech Language Pathology Treatment: Dysphagia  Patient Details Name: Robin Graves MRN: [id number] DOB: 11/17/1940 Today's Date: 08/28/2023 Time: 1010-1050 SLP Time Calculation (min) (ACUTE ONLY): 40 min  Assessment / Plan / Recommendation Clinical Impression   Pt seen for ongoing toleration of diet today. Pt awake, verbal w/ fairly consistent follow through given cues. Noted less WOB/SOB at rest PRIOR TO any po's given vs yesterday at BSE. Noted a more barrel-chest presentation lying in bed. Pt has Baseline Dementia and requires cues. OF NOTE: pt had recent ED admit w/ dx'd Esophagitis on 08/22/2023. Sister unsure if pt took her Medication prescribed for such.   On [place] O2 support- 2L; afebrile, WBC 10.9.    OF NOTE: Pt strongly endorse s/s of REFLUX and Discomfort when swallowing pointing to her mid-sternum area. She feels that liquids "are worse" than foods. She declined most trials only accepting a few po's during assessment. Sister stated pt "has not been eating since coming home".    Pt appeared to present w/ grossly functional oropharyngeal phase swallowing w/ few trials of po's w/ No overt oropharyngeal phase dysphagia appreciated during; no immediate, overt s/s of aspiration noted w/ the trials. Pt appears at a somewhat reduced risk for aspiration from an oropharyngeal phase standpoint when following general aspiration precautions w/ Support and Supervision at meals(d/t Baseline Dementia). HOWEVER, pt has a baseline presentation of REFLUX activity, recent Esophagitis, and c/o Esophageal phase Discomfort when food/liquid passes along the Esophagus (mid-sternum area).  ANY Dysmotility or Regurgitation of Reflux material can increase risk for aspiration of the Reflux material during Retrograde flow thus impact Voicing and Pulmonary status.    Pt also has challenging factors that could impact her oropharyngeal swallowing to include discomfort in the bed(NSG aware), fatigue/weakness, deconditioning  and reduced ability to feed self, Baseline Dementia/Cognitive decline, hiatal hernia and Esophageal phase Discomfort, and advanced age. These factors can increase risk for aspiration, dysphagia as well as decreased oral intake overall.     Pt positioned more midline/upright in bed for po trials(but then wanted to lie back again immediately afterwards). She consumed a few trials of thin liquids Via Straw, and purees; she Declined the Minced/moistened soft solid foods -- stated "I can't do foods" and nodded when asked if it were d/t her Esophageal Dysmotility and Discomfort. No immediate, overt clinical s/s of aspiration noted w/ trials consumed; clear vocal quality b/t trials, no decline in pulmonary status from her Baseline, no sustained decline in O2 sats(96% baseline whnd checked). REST BREAKS given b/t trials to support her breathing effort and for conservation of energy and for aiding clearing of the Esophagus. Oral phase appeared grossly [facility] for bolus management and timely A-P transfer/clearing of material. Seech clear; low volume.   Recommend a Dysphagia level 1 diet (Purees, moistened foods to aid oral intake overall d/t less effort to chew as well as support Esophageal clearing) w/ thin liquids -- monitoring of sips w/ pt helping to Hold Cup when drinking. REFLUX precautions. Aspiration precautions. Rest Breaks b/t bites/sips and during meals/oral intake to allow for Esophageal clearing and Calming of Breathing; conservation of energy strategies. Feeding Support at meals. Positioning support at meals. REFLUX precautions strongly recommended to lessen chance for Regurgitation -- HOB elevated at night when sleeping. Pills CRUSHED in puree.  Noted PPI 1x daily.    Recommend pt f/u w/ GI for assessment/management of Esophageal Discomfort/Dysmotility and Reflux. Discussion and handouts given on REFLUX, strategies to help manage, and foods/diet. Pt appears at her  Baseline from an oropharyngeal phase  standpoint in setting of comorbidities and repeat Acute illnesses; pt can have f/u at her next venue of care IF diet upgrade is sought out in the future. Dietician f/u. Palliative Care f/u.  MD to reconsult ST services if any new needs while admitted. NSG updated.       HPI HPI: Pt is an 83 y.o. female with a history of Dementia/Memory Loss per chart note, COPD, CAD, diastolic CHF, CVA, hypothyroidism, GERD, who presents with hypotension and atrial fibrillation.  Per EMS, the patient's family or friends had not heard from her in a day or 2 and when someone went to check on her she was found down.  EMS found that her blood pressure was 64/36 and she was in atrial fibrillation.  Pt admitted to the ED this visit w/ issues including hypotension and AFIB with RVR w/ cardioconversion tx in the ED.  Patient was recently hospitalized from 2/4 - 08/15/23 due to COPD exacerbation secondary to Flu A.  Patient was treated with Tamiflu and discharged on prednisone.  Her shortness of breath has improved, but developed the pain in esophagus, described as burning pain from oral along esophageal pathway in her chest.  The pain is constant, sharp, scratching, moderate, nonradiating.  She had poor appetite and decreased oral intake because of the pain. She readmitted 2/13-2/14/2025 and was treated for Esophagitis then D/C'd home w/ Meds.   Per recent Chest CT Imaging last on 08/14/2023: "2.1 x 2 x 1.8 cm lobular solid nodule with pleural stranding in  the base of the right upper lobe anteriorly, highly worrisome for a  primary bronchogenic neoplasm. PET-CT or tissue sampling  recommended.  3. Additional new 8 x 6 mm cavitary nodule in the left upper lobe,  could be a neoplasm or an inflammatory nodule.  4. Emphysema.  5. Aortic and coronary artery atherosclerosis.  6. Moderate calcification and thickening of the aortic valve  leaflets. Consider echocardiographic evaluation.  7. Small hiatal hernia.  8. Osteopenia and degenerative  change with multilevel thoracic spine  chronic compression fractures, with old kyphoplasty at T9 and 12.  9. 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  CXR at this admit: "Progressive diffuse interstitial and patchy/nodular bilateral  airspace disease, potentially secondary to edema given the  relatively rapid progression.".      SLP Plan  All goals met      Recommendations for follow up therapy are one component of a multi-disciplinary discharge planning process, led by the attending physician.  Recommendations may be updated based on patient status, additional functional criteria and insurance authorization.    Recommendations  Diet recommendations: Dysphagia 1 (puree);Thin liquid Liquids provided via: Cup;Straw (monitor) Medication Administration: Crushed with puree Supervision: Staff to assist with self feeding;Full supervision/cueing for compensatory strategies Compensations: Minimize environmental distractions;Slow rate;Small sips/bites;Lingual sweep for clearance of pocketing;Follow solids with liquid Postural Changes and/or Swallow Maneuvers: Out of bed for meals;Seated upright 90 degrees;Upright 30-60 min after meal                 (GI f/u for management) Oral care BID;Oral care before and after PO;Staff/trained caregiver to provide oral care (denture care)   Frequent or constant Supervision/Assistance Dysphagia, pharyngoesophageal phase (R13.14) (Suspect Esophageal phase Dysmotility d/t her c/o Discomfort and Belching immediately after)     All goals met       Jerilynn Som, MS, CCC-SLP Speech Language Pathologist Rehab Services; [facility] [phone] (ascom) Mosiah Bastin  08/28/2023,  2:36 PM

## 2023-08-28 NOTE — Plan of Care (Signed)

## 2023-08-28 NOTE — TOC Progression Note (Addendum)
 Transition of Care [facility]) - Progression Note    Patient Details  Name: Robin Graves MRN: [id number] Date of Birth: 1940/12/17  [facility]) CM/SW Contact  Allena Katz, LCSW Phone Number: 08/28/2023, 1:53 PM  Clinical Narrative:   CSW spoke with daughter who states she wants her evaluated for hospice house in [place]. She states she lives in [place] and that she doesn't have any family locally. She states that if she doesn't meet for inpatient hospice she may have to come down and rent a house in [place]. I explained to daughter she will need to discuss with pt as well because pt is axo4 and wants to go home. This is not safe as patient has no other supports.         Expected Discharge Plan and Services                                               Social Determinants of Health (SDOH) Interventions SDOH Screenings   Food Insecurity: Patient Unable To Answer (08/26/2023)  Housing: Patient Unable To Answer (08/26/2023)  Transportation Needs: Patient Unable To Answer (08/26/2023)  Utilities: Patient Unable To Answer (08/26/2023)  Financial Resource Strain: Low Risk  (08/21/2023)   Received from [facility]  Social Connections: Unknown (08/26/2023)  Tobacco Use: Medium Risk (08/22/2023)    Readmission Risk Interventions     No data to display

## 2023-08-28 NOTE — Hospital Course (Addendum)
 Robin Graves is a 83 year old female with history of COPD, coronary disease, diastolic congestive heart failure, history of CVA, hypothyroidism, dementia, gastroesophageal reflux disease she was brought to hospital by family member, she had a significant hypotension and atrial fibrillation with RVR.  She was found down at home and not responding to calls for a few hours. Patient was given fluids, patient was also placed on phenylephrine.  Antibiotics was started. Patient became hemodynamically stable on 2/19.

## 2023-08-29 ENCOUNTER — Inpatient Hospital Stay: Payer: Medicare PPO

## 2023-08-29 DIAGNOSIS — J690 Pneumonitis due to inhalation of food and vomit: Secondary | ICD-10-CM | POA: Diagnosis not present

## 2023-08-29 DIAGNOSIS — R1319 Other dysphagia: Secondary | ICD-10-CM | POA: Insufficient documentation

## 2023-08-29 LAB — BASIC METABOLIC PANEL
Anion gap: 5 (ref 5–15)
BUN: 13 mg/dL (ref 8–23)
CO2: 23 mmol/L (ref 22–32)
Calcium: 8.3 mg/dL — ABNORMAL LOW (ref 8.9–10.3)
Chloride: 117 mmol/L — ABNORMAL HIGH (ref 98–111)
Creatinine, Ser: 0.71 mg/dL (ref 0.44–1.00)
GFR, Estimated: >60 mL/min (ref >60)
Glucose, Bld: 105 mg/dL — ABNORMAL HIGH (ref 70–99)
Potassium: 4.2 mmol/L (ref 3.5–5.1)
Sodium: 145 mmol/L (ref 135–145)

## 2023-08-29 LAB — MAGNESIUM: Magnesium: 1.6 mg/dL — ABNORMAL LOW (ref 1.7–2.4)

## 2023-08-29 LAB — PHOSPHORUS: Phosphorus: 1.5 mg/dL — ABNORMAL LOW (ref 2.5–4.6)

## 2023-08-29 MED ORDER — SUCRALFATE 1 GM/10ML PO SUSP
1.0000 g | Freq: Three times a day (TID) | ORAL | Status: DC
Start: 2023-08-29 — End: 2023-09-01
  Administered 2023-08-29 – 2023-09-01 (×14): 1 g via ORAL
  Filled 2023-08-29 (×16): qty 10

## 2023-08-29 MED ORDER — NYSTATIN 100000 UNIT/ML MT SUSP
5.0000 mL | Freq: Four times a day (QID) | OROMUCOSAL | Status: DC
  Administered 2023-08-29 – 2023-09-01 (×15): 500000 [IU] via ORAL
  Filled 2023-08-29 (×15): qty 5

## 2023-08-29 MED ORDER — POTASSIUM & SODIUM PHOSPHATES 280-160-250 MG PO PACK
1.0000 | PACK | Freq: Three times a day (TID) | ORAL | Status: DC
  Administered 2023-08-29 (×2): 1 via ORAL
  Filled 2023-08-29 (×4): qty 1

## 2023-08-29 MED ORDER — SODIUM PHOSPHATES 45 MMOLE/15ML IV SOLN: 45.0000 mmol | Freq: Once | INTRAVENOUS | Status: DC

## 2023-08-29 MED ORDER — STERILE WATER FOR INJECTION IJ SOLN
INTRAMUSCULAR | Status: AC
  Administered 2023-08-29: 10 mL
  Filled 2023-08-29: qty 10

## 2023-08-29 MED ORDER — SODIUM PHOSPHATES 45 MMOLE/15ML IV SOLN
30.0000 mmol | Freq: Once | INTRAVENOUS | Status: AC
  Administered 2023-08-29: 30 mmol via INTRAVENOUS
  Filled 2023-08-29: qty 10

## 2023-08-29 MED ORDER — FLUCONAZOLE 40 MG/ML PO SUSR: 200.0000 mg | Freq: Every day | ORAL | Status: DC

## 2023-08-29 MED ORDER — PANTOPRAZOLE SODIUM 40 MG IV SOLR
40.0000 mg | Freq: Two times a day (BID) | INTRAVENOUS | Status: DC
  Administered 2023-08-29 – 2023-09-01 (×7): 40 mg via INTRAVENOUS
  Filled 2023-08-29 (×7): qty 10

## 2023-08-29 MED ORDER — MAGNESIUM SULFATE 2 GM/50ML IV SOLN
2.0000 g | Freq: Once | INTRAVENOUS | Status: AC
  Administered 2023-08-29: 2 g via INTRAVENOUS
  Filled 2023-08-29: qty 50

## 2023-08-29 MED ORDER — TRAMADOL HCL 50 MG PO TABS
50.0000 mg | ORAL_TABLET | Freq: Once | ORAL | Status: AC
  Administered 2023-08-29: 50 mg via ORAL
  Filled 2023-08-29: qty 1

## 2023-08-29 NOTE — Progress Notes (Signed)
 Physical Therapy Treatment Patient Details Name: ALAA MULLALLY MRN: [id number] DOB: 29-Dec-1940 Today's Date: 08/29/2023   History of Present Illness Dorsal Carlo is a 83 year old female with history of COPD, coronary disease, diastolic congestive heart failure, history of CVA, hypothyroidism, dementia, gastroesophageal reflux disease she was brought to hospital by family member, she had a significant hypotension and atrial fibrillation with RVR.  She was found down at home for about a few hours. She was admitted with septic shock secondary to asiration pneumonia, acute hypoxemic respiratory failure, acute metabolic encephalopathy, acute kidney injury.    PT Comments  Patient received reclining in bed and is agreeable to PT. She is alert and oriented to self and date and not assessed directly for situation or location. She performed supine to sit mod I and transferred with supervision using RW. She ambulated around the nursing station (~210 feet) with RW and  CGA - supervision, using a fast pace but allowing RW to get further ahead of her than recommended for safety. She also needed cuing to keep her RW placed correctly in front of her when turning to sit in the chair. She does not usually use an AD, so she would benefit from more education and practice with a RW. She declined to attempt ambulation today without the walker. Patient is making progress towards her goals and discharge recommendation updated based on her need for less assistance and improved mobiltiy skills. Her HR and SpO2 remained WFL during session. Patient would benefit from skilled physical therapy to address impairments and functional limitations (see PT Problem List below) to work towards stated goals and return to PLOF or maximal functional independence.     If plan is discharge home, recommend the following: A little help with bathing/dressing/bathroom;Help with stairs or ramp for entrance;Assist for transportation;Assistance with  cooking/housework   Can travel by private vehicle     Yes  Equipment Recommendations  None recommended by PT    Recommendations for Other Services       Precautions / Restrictions Precautions Precautions: Fall Restrictions Weight Bearing Restrictions Per Provider Order: No     Mobility  Bed Mobility Overal bed mobility: Modified Independent Bed Mobility: Supine to Sit     Supine to sit: Modified independent (Device/Increase time)     General bed mobility comments: supine to sit with increased time/effort. PT helped move some heavy blankets of feet.    Transfers Overall transfer level: Needs assistance Equipment used: Rolling walker (2 wheels) Transfers: Sit to/from Stand Sit to Stand: Supervision           General transfer comment: Patient places hands on walker before standing and moved walker to the side when going to sit. She reports she does not usually use an assistive device but does not want to attempt mobility without the RW.    Ambulation/Gait Ambulation/Gait assistance: Supervision, Contact guard assist Gait Distance (Feet): 210 Feet Assistive device: Rolling walker (2 wheels) Gait Pattern/deviations: Trunk flexed, Step-through pattern Gait velocity: WFL     General Gait Details: patient ambulated around the nursing station with RW and CGA to supervision. She walked at a swift pace and let the RW get further in front of her than ideal.   Stairs             Wheelchair Mobility     Tilt Bed    Modified Rankin (Stroke Patients Only)       Balance Overall balance assessment: Needs assistance Sitting-balance support: Bilateral upper extremity supported, Feet  unsupported Sitting balance-Leahy Scale: Good Sitting balance - Comments: steady sitting at edge of bed and chair, fatigues easily   Standing balance support: During functional activity, Bilateral upper extremity supported, Reliant on assistive device for balance Standing  balance-Leahy Scale: Fair Standing balance comment: Patient reliant on RW or other UE support during dynamic mobility                            Communication Communication Communication: No apparent difficulties  Cognition Arousal: Alert Behavior During Therapy: WFL for tasks assessed/performed   PT - Cognitive impairments: No family/caregiver present to determine baseline                       PT - Cognition Comments: has diagnosis of dementia. oriented to self and date. did not ask location or situation today. Following commands: Intact      Cueing Cueing Techniques: Verbal cues  Exercises      General Comments        Pertinent Vitals/Pain Pain Assessment Pain Assessment: Faces Faces Pain Scale: Hurts a little bit Pain Location: right lower abdomen Pain Descriptors / Indicators: Discomfort Pain Intervention(s): Limited activity within patient's tolerance, Monitored during session, Repositioned    Home Living                          Prior Function            PT Goals (current goals can now be found in the care plan section) Acute Rehab PT Goals Patient Stated Goal: to go home PT Goal Formulation: With patient Time For Goal Achievement: 09/11/23 Potential to Achieve Goals: Good Progress towards PT goals: Progressing toward goals    Frequency    Min 1X/week      PT Plan      Co-evaluation              AM-PAC PT "6 Clicks" Mobility   Outcome Measure  Help needed turning from your back to your side while in a flat bed without using bedrails?: None Help needed moving from lying on your back to sitting on the side of a flat bed without using bedrails?: None Help needed moving to and from a bed to a chair (including a wheelchair)?: A Little Help needed standing up from a chair using your arms (e.g., wheelchair or bedside chair)?: A Little Help needed to walk in hospital room?: A Little Help needed climbing 3-5 steps  with a railing? : A Little 6 Click Score: 20    End of Session Equipment Utilized During Treatment: Gait belt Activity Tolerance: Patient tolerated treatment well Patient left: with call bell/phone within reach;in chair;with chair alarm set Nurse Communication: Mobility status PT Visit Diagnosis: Muscle weakness (generalized) (M62.81);Other abnormalities of gait and mobility (R26.89)     Time: 1030-1051 PT Time Calculation (min) (ACUTE ONLY): 21 min  Charges:    $Therapeutic Activity: 8-22 mins PT General Charges $$ ACUTE PT VISIT: 1 Visit                     Luretha Murphy. Ilsa Iha, PT, DPT 08/29/23, 11:12 AM

## 2023-08-29 NOTE — TOC Progression Note (Addendum)
 Transition of Care [facility]) - Progression Note    Patient Details  Name: Robin Graves MRN: [id number] Date of Birth: Oct 16, 1940  [facility] CM/SW Contact  Truddie Hidden, RN Phone Number: 08/29/2023, 3:11 PM  Clinical Narrative:    Spoke with patient's daughter, [name], about physical therapy's recommendation for HHPT/ OT. Patient's daughter inquired about SNF. She was advised SNF admission would likely be private pay due to patient's care being able to be given at home. Patient is unable to private pay. [name] is agreeable to [facility]. She was provided choices for PAR providers [facility], [facility], and [facility]. She would like to proceed with [facility]. [name] has been advised [facility] will contact her to scheduled SOC usually within 48 hours of discharge. [name] stated she will have to move in with patient to care for her and will need time to prepare her home as it's current state needs improvement and will have to be wifi accessible so she can continue to work. [name] currently herself lives in [place] but will be able to transport patient home Sunday. She is requesting a WC and walker for patient. MD notified.  [place] from [place] notified of likely discharge Sunday.          Expected Discharge Plan and Services                                               Social Determinants of Health (SDOH) Interventions SDOH Screenings   Food Insecurity: Patient Unable To Answer (08/26/2023)  Housing: Patient Unable To Answer (08/26/2023)  Transportation Needs: Patient Unable To Answer (08/26/2023)  Utilities: Patient Unable To Answer (08/26/2023)  Financial Resource Strain: Low Risk  (08/21/2023)   Received from [facility]  Social Connections: Unknown (08/26/2023)  Tobacco Use: Medium Risk (08/22/2023)    Readmission Risk Interventions     No data to display

## 2023-08-29 NOTE — Progress Notes (Addendum)
 [facility] CLINIC CARDIOLOGY PROGRESS NOTE       Patient ID: Robin Graves MRN: [id number] DOB/AGE: [birth year] 83 y.o.  Admit date: 08/26/2023 Referring Physician Dr. Dionne Bucy Primary Physician Barbette Reichmann, MD  Primary Cardiologist Dr. Darrold Junker Reason for Consultation atrial fibrillation RVR  HPI: Robin Graves is a 83 y.o. female  with a past medical history of COPD, CAD, diastolic CHF, CVA, hypothyroidism, GERD, and dementia who presents with hypotension and atrial fibrillation who presented to the ED on 08/26/2023 for hypotension.  EMS was called to her home after family had not heard from her in a few days.  Found to be hypotensive and in A-fib RVR with heart rate in the 170s.  Cardiology was consulted for further evaluation.   Interval history: -Patient seen and examined this AM, reports she feels well.  -Denies any CP, SOB, palpitations.  -Remaining in NSR with well controlled HR.  -BP much improved.   Review of systems complete and found to be negative unless listed above    Past Medical History:  Diagnosis Date   Anemia    Arthritis    BACK   Chicken pox    Colon polyps    COPD (chronic obstructive pulmonary disease) (HCC)    Diverticulosis    Emphysema of lung (HCC)    GERD (gastroesophageal reflux disease)    Graves disease    Graves' disease with exophthalmos    REMOVED ON THYROID MEDS   Headache    MIGRAINES, 1 EVERY DAY OR LESS OFTEN   HH (hiatus hernia)    Hyperlipidemia    Pernicious anemia    Stroke (HCC)    MINI STROKES 1995 A FEW BEFORE 1995   Wears dentures    UPPER AND LOWER    Past Surgical History:  Procedure Laterality Date   ABDOMINAL HYSTERECTOMY  1974   AUGMENTATION MAMMAPLASTY Bilateral 1976   silicone   AUGMENTATION MAMMAPLASTY Bilateral 1996   saline   BACK SURGERY  JUNE,4,2015   HURT AT WORK   BREAST SURGERY Bilateral , 1973   history of leakage   CATARACT EXTRACTION W/PHACO Right 04/20/2015   Procedure: CATARACT  EXTRACTION PHACO AND INTRAOCULAR LENS PLACEMENT (IOC);  Surgeon: Lockie Mola, MD;  Location: [facility];  Service: Ophthalmology;  Laterality: Right;   CATARACT EXTRACTION W/PHACO Left 01/23/2017   Procedure: CATARACT EXTRACTION PHACO AND INTRAOCULAR LENS PLACEMENT (IOC)  Left;  Surgeon: Lockie Mola, MD;  Location: [facility];  Service: Ophthalmology;  Laterality: Left;   CHOLECYSTECTOMY     COLONOSCOPY WITH PROPOFOL N/A 05/16/2015   Procedure: COLONOSCOPY WITH PROPOFOL;  Surgeon: Elnita Maxwell, MD;  Location: [facility] END[facility]COPY;  Service: Endoscopy;  Laterality: N/A;   ESOPHAGOGASTRODUODENOSCOPY (EGD) WITH PROPOFOL N/A 05/16/2015   Procedure: ESOPHAGOGASTRODUODENOSCOPY (EGD) WITH PROPOFOL;  Surgeon: Elnita Maxwell, MD;  Location: [facility] ENDOSCOPY;  Service: Endoscopy;  Laterality: N/A;   EYE SURGERY     KYPHOPLASTY  12/10/2013   T12   KYPHOPLASTY N/A 11/08/2016   Procedure: KYPHOPLASTY- T9;  Surgeon: Kennedy Bucker, MD;  Location: [facility] ORS;  Service: Orthopedics;  Laterality: N/A;   MASTECTOMY Bilateral 1970's   subcutaneous mastectomy done for preventative reasons with reconstruction   thyroid ablation      Medications Prior to Admission  Medication Sig Dispense Refill Last Dose/Taking   albuterol (VENTOLIN HFA) 108 (90 Base) MCG/ACT inhaler Inhale 2 puffs into the lungs every 6 (six) hours as needed for wheezing or shortness of breath. 8 g 2  08/25/2023   aspirin EC 81 MG tablet Take 81 mg by mouth daily. AM   Past Week   cyanocobalamin (,VITAMIN B-12,) 1000 MCG/ML injection INJECT 1 ML AS DIRECTED MONTHLY 10 mL 12 08/25/2023   donepezil (ARICEPT) 5 MG tablet Take 5 mg by mouth in the morning.   08/25/2023   feeding supplement (ENSURE ENLIVE / ENSURE PLUS) LIQD Take 237 mLs by mouth 2 (two) times daily between meals.   08/25/2023   ferrous sulfate 325 (65 FE) MG tablet Take 1 tablet (325 mg total) by mouth daily with breakfast. 30 tablet 7 Past Week    ipratropium-albuterol (DUONEB) 0.5-2.5 (3) MG/3ML SOLN Take 3 mLs by nebulization every 6 (six) hours as needed. 120 mL 2 Taking As Needed   levothyroxine (SYNTHROID) 50 MCG tablet Take 50 mcg by mouth daily before breakfast. Monday- Friday Patient takes 50 mcg   08/25/2023   levothyroxine (SYNTHROID, LEVOTHROID) 75 MCG tablet Take 75 mcg by mouth daily before breakfast. Saturday-Sunday   Past Week   pantoprazole (PROTONIX) 40 MG tablet Take 1 tablet (40 mg total) by mouth 2 (two) times daily. 60 tablet 2 Past Week   potassium chloride (K-DUR,KLOR-CON) 10 MEQ tablet Take 10 mEq by mouth 2 (two) times daily.   Past Week   pravastatin (PRAVACHOL) 80 MG tablet Take 1 tablet (80 mg total) by mouth daily. 30 tablet 7 08/25/2023   Social History   Socioeconomic History   Marital status: Widowed    Spouse name: Not on file   Number of children: Not on file   Years of education: Not on file   Highest education level: Not on file  Occupational History   Not on file  Tobacco Use   Smoking status: Former    Current packs/day: 0.00    Average packs/day: 0.3 packs/day for 4.0 years (1.0 ttl pk-yrs)    Types: Cigarettes    Start date: 04/23/2004    Quit date: 04/23/2008    Years since quitting: 15.3   Smokeless tobacco: Never   Tobacco comments:    was still having occ cig until 07/2016  Substance and Sexual Activity   Alcohol use: No   Drug use: No   Sexual activity: Not on file  Other Topics Concern   Not on file  Social History Narrative   Not on file   Social Drivers of Health   Financial Resource Strain: Low Risk  (08/21/2023)   Received from [facility]   Overall Financial Resource Strain (CARDIA)    Difficulty of Paying Living Expenses: Not hard at all  Food Insecurity: Patient Unable To Answer (08/26/2023)   Hunger Vital Sign    Worried About Running Out of Food in the Last Year: Patient unable to answer    Ran Out of Food in the Last Year: Patient unable to  answer  Transportation Needs: Patient Unable To Answer (08/26/2023)   PRAPARE - Transportation    Lack of Transportation (Medical): Patient unable to answer    Lack of Transportation (Non-Medical): Patient unable to answer  Physical Activity: Not on file  Stress: Not on file  Social Connections: Unknown (08/26/2023)   Social Connection and Isolation Panel [NHANES]    Frequency of Communication with Friends and Family: Patient unable to answer    Frequency of Social Gatherings with Friends and Family: Patient unable to answer    Attends Religious Services: Patient unable to answer    Active Member of Clubs or Organizations: Yes  Attends Banker Meetings: Patient unable to answer    Marital Status: Patient unable to answer  Intimate Partner Violence: Unknown (08/26/2023)   Humiliation, Afraid, Rape, and Kick questionnaire    Fear of Current or Ex-Partner: Patient unable to answer    Emotionally Abused: Patient unable to answer    Physically Abused: No    Sexually Abused: Patient unable to answer    Family History  Problem Relation Age of Onset   Coronary artery disease Mother    Stroke Father    Heart attack Father    Cancer Brother        colon   Stroke Brother    Heart attack Brother    Diabetes Brother      Vitals:   08/29/23 0347 08/29/23 0351 08/29/23 0500 08/29/23 0759  BP:  120/64  121/65  Pulse:  83  81  Resp:  20    Temp:  97.7 F (36.5 C)  (!) 97.3 F (36.3 C)  TempSrc:  Oral  Oral  SpO2:  96%  96%  Weight: 40.6 kg  40.3 kg   Height:        PHYSICAL EXAM General: Chronically ill-appearing elderly female, well nourished, in no acute distress. HEENT: Normocephalic and atraumatic. Neck: No JVD.  Lungs: Normal respiratory effort on room air. Clear bilaterally to auscultation. No wheezes, crackles, rhonchi.  Heart: HRRR. Normal S1 and S2 without gallops or murmurs.  Abdomen: Non-distended appearing.  Msk: Normal strength and tone for  age. Extremities: Warm and well perfused. No clubbing, cyanosis.  No edema.  Neuro: Alert and oriented X 3. Psych: Answers questions appropriately.   Labs: Basic Metabolic Panel: Recent Labs    08/28/23 0645 08/28/23 1614 08/29/23 0652  NA 146*  --  145  K 2.4* 5.1 4.2  CL 118*  --  117*  CO2 20*  --  23  GLUCOSE 99  --  105*  BUN 22  --  13  CREATININE 0.76  --  0.71  CALCIUM 8.2*  --  8.3*  MG 1.9  --  1.6*  PHOS 1.7*  --  1.5*   Liver Function Tests: Recent Labs    08/27/23 0450 08/28/23 0645  ALBUMIN 1.9* 1.7*   No results for input(s): "LIPASE", "AMYLASE" in the last 72 hours. CBC: Recent Labs    08/27/23 0450 08/28/23 0645  WBC 9.3 10.9*  HGB 9.6* 9.8*  HCT 30.6* 29.1*  MCV 97.5 91.5  PLT 137* 148*   Cardiac Enzymes: Recent Labs    08/26/23 1613 08/26/23 1742 08/27/23 0450  TROPONINIHS 649* 763* 638*   BNP: No results for input(s): "BNP" in the last 72 hours.  D-Dimer: No results for input(s): "DDIMER" in the last 72 hours. Hemoglobin A1C: No results for input(s): "HGBA1C" in the last 72 hours. Fasting Lipid Panel: No results for input(s): "CHOL", "HDL", "LDLCALC", "TRIG", "CHOLHDL", "LDLDIRECT" in the last 72 hours. Thyroid Function Tests: Recent Labs    08/26/23 2203  TSH 1.469   Anemia Panel: No results for input(s): "VITAMINB12", "FOLATE", "FERRITIN", "TIBC", "IRON", "RETICCTPCT" in the last 72 hours.   Radiology: ECHOCARDIOGRAM COMPLETE Result Date: 08/26/2023    ECHOCARDIOGRAM REPORT   Patient Name:   Robin Graves Date of Exam: 08/26/2023 Medical Rec #:  [id number]   Height:       60.0 in Accession #:    [id number]  Weight:       92.8 lb Date of Birth:  [birth year]  BSA:          1.347 m Patient Age:    82 years    BP:           95/57 mmHg Patient Gender: F           HR:           92 bpm. Exam Location:  [facility] Procedure: 2D Echo, Cardiac Doppler and Color Doppler (Both Spectral and Color            Flow Doppler were utilized during  procedure). Indications:     Shock R57.9  History:         Patient has no prior history of Echocardiogram examinations.                  COPD and Stroke.  Sonographer:     Cristela Blue Referring Phys:  [id number] Savion Washam Diagnosing Phys: Windell Norfolk  Sonographer Comments: Technically challenging study due to limited acoustic windows. Image acquisition challenging due to COPD. IMPRESSIONS  1. Technically difficult study.  2. Left ventricular ejection fraction, by estimation, is 60 to 65%. The left ventricle has normal function. The left ventricle has no regional wall motion abnormalities. There is mild left ventricular hypertrophy. Left ventricular diastolic parameters are indeterminate.  3. Right ventricular systolic function is normal. The right ventricular size is normal.  4. Moderate mitral valve regurgitation.  5. The aortic valve was not well visualized. There is moderate calcification of the aortic valve. Aortic valve regurgitation is not visualized. Mild aortic valve stenosis. FINDINGS  Left Ventricle: Left ventricular ejection fraction, by estimation, is 60 to 65%. The left ventricle has normal function. The left ventricle has no regional wall motion abnormalities. Strain imaging was not performed. The left ventricular internal cavity  size was normal in size. There is mild left ventricular hypertrophy. Left ventricular diastolic parameters are indeterminate. Right Ventricle: The right ventricular size is normal. No increase in right ventricular wall thickness. Right ventricular systolic function is normal. Left Atrium: Left atrial size was normal in size. Right Atrium: Right atrial size was normal in size. Pericardium: There is no evidence of pericardial effusion. Mitral Valve: There is mild thickening of the mitral valve leaflet(s). Mild mitral annular calcification. Moderate mitral valve regurgitation. MV peak gradient, 4.2 mmHg. The mean mitral valve gradient is 3.0 mmHg. Tricuspid Valve: The  tricuspid valve is not well visualized. Tricuspid valve regurgitation is mild. Aortic Valve: Peak velocity 2.4 m/s, Mean gradient 13 mmHg, Dimensionless index 0.50. The aortic valve was not well visualized. There is moderate calcification of the aortic valve. Aortic valve regurgitation is not visualized. Mild aortic stenosis is present. Aortic valve mean gradient measures 9.2 mmHg. Aortic valve peak gradient measures 16.6 mmHg. Aortic valve area, by VTI measures 1.90 cm. Pulmonic Valve: The pulmonic valve was not well visualized. Pulmonic valve regurgitation is trivial. Aorta: The aortic root is normal in size and structure. Venous: The inferior vena cava was not well visualized. IAS/Shunts: The interatrial septum was not well visualized. Additional Comments: 3D imaging was not performed.  LEFT VENTRICLE PLAX 2D LVIDd:         2.20 cm   Diastology LVIDs:         1.50 cm   LV e' medial:    7.62 cm/s LV PW:         0.90 cm   LV E/e' medial:  11.5 LV IVS:        1.10 cm   LV  e' lateral:   11.40 cm/s LVOT diam:     2.00 cm   LV E/e' lateral: 7.7 LV SV:         68 LV SV Index:   50 LVOT Area:     3.14 cm  RIGHT VENTRICLE RV Basal diam:  2.90 cm RV Mid diam:    3.20 cm RV S prime:     13.60 cm/s TAPSE (M-mode): 1.9 cm LEFT ATRIUM           Index        RIGHT ATRIUM          Index LA diam:      2.80 cm 2.08 cm/m   RA Area:     7.09 cm LA Vol (A2C): 9.1 ml  6.76 ml/m   RA Volume:   10.30 ml 7.65 ml/m LA Vol (A4C): 18.1 ml 13.44 ml/m  AORTIC VALVE AV Area (Vmax):    1.80 cm AV Area (Vmean):   1.96 cm AV Area (VTI):     1.90 cm AV Vmax:           204.00 cm/s AV Vmean:          137.750 cm/s AV VTI:            0.358 m AV Peak Grad:      16.6 mmHg AV Mean Grad:      9.2 mmHg LVOT Vmax:         117.00 cm/s LVOT Vmean:        86.100 cm/s LVOT VTI:          0.216 m LVOT/AV VTI ratio: 0.60  AORTA Ao Root diam: 2.90 cm MITRAL VALVE                  TRICUSPID VALVE MV Area (PHT): 3.76 cm       TR Peak grad:   20.8 mmHg MV  Area VTI:   2.94 cm       TR Vmax:        228.00 cm/s MV Peak grad:  4.2 mmHg MV Mean grad:  3.0 mmHg       SHUNTS MV Vmax:       1.02 m/s       Systemic VTI:  0.22 m MV Vmean:      78.2 cm/s      Systemic Diam: 2.00 cm MV Decel Time: 202 msec MR Peak grad:    99.2 mmHg MR Mean grad:    51.0 mmHg MR Vmax:         498.00 cm/s MR Vmean:        326.0 cm/s MR PISA:         1.01 cm MR PISA Eff ROA: 6 mm MR PISA Radius:  0.40 cm MV E velocity: 88.00 cm/s MV A velocity: 100.00 cm/s MV E/A ratio:  0.88 Windell Norfolk Electronically signed by Windell Norfolk Signature Date/Time: 08/26/2023/5:38:03 PM    Final    DG Chest Port 1 View Result Date: 08/26/2023 CLINICAL DATA:  Atrial fibrillation.  Unresponsive. EXAM: PORTABLE CHEST 1 VIEW COMPARISON:  08/22/2023 FINDINGS: Marked thoracolumbar scoliosis. Cardiopericardial silhouette is at upper limits of normal for size. Diffuse interstitial and patchy/nodular bilateral airspace disease appears progressive in the interval, potentially secondary to edema given the relatively rapid progression. Diffuse infection not excluded. Nodular right parahilar density seen previously is again noted. Bones are diffusely demineralized. Telemetry leads overlie the chest. IMPRESSION: 1. Progressive diffuse interstitial and patchy/nodular bilateral  airspace disease, potentially secondary to edema given the relatively rapid progression. Diffuse infection not excluded. Close follow-up recommended to ensure resolution. 2. Nodular right parahilar density seen previously is again noted. Electronically Signed   By: Kennith Center M.D.   On: 08/26/2023 08:11   DG Chest 2 View Result Date: 08/22/2023 CLINICAL DATA:  Increasing pain in mouth, esophagus, and stomach for 1 week, dysphagia EXAM: CHEST - 2 VIEW COMPARISON:  08/14/2023, 08/13/2023 FINDINGS: Frontal and lateral views of the chest demonstrate a stable cardiac silhouette. The right upper lobe mass seen on prior CT is faintly apparent  adjacent to the right hilum on the frontal view, concerning for malignancy based on previous CT imaging. The subcentimeter left upper lobe nodule seen on prior CT is not well visualized by x-ray. Stable asymmetric right apical pleural thickening, likely benign scarring given long-term stability. No acute airspace disease, effusion, or pneumothorax. Stable thoracic compression deformities with prior vertebral augmentations. IMPRESSION: 1. Stable right upper lobe perihilar mass concerning for malignancy based on previous CT findings. Subcentimeter left upper lobe nodule seen on prior CT is not visualized by x-ray. PET scan again recommended if not performed in the interim. 2. No acute airspace disease. Electronically Signed   By: Sharlet Salina M.D.   On: 08/22/2023 15:34   CT Angio Chest PE W and/or Wo Contrast Result Date: 08/14/2023 CLINICAL DATA:  Shortness of breath, abdominal pain, positive flu test on 4 days ago. Also, PA and lateral chest yesterday demonstrated suspected mass in the retrosternal clear space on the lateral view only. EXAM: CT ANGIOGRAPHY CHEST WITH CONTRAST TECHNIQUE: Multidetector CT imaging of the chest was performed using the standard protocol during bolus administration of intravenous contrast. Multiplanar CT image reconstructions and MIPs were obtained to evaluate the vascular anatomy. RADIATION DOSE REDUCTION: This exam was performed according to the departmental dose-optimization program which includes automated exposure control, adjustment of the mA and/or kV according to patient size and/or use of iterative reconstruction technique. CONTRAST:  75mL OMNIPAQUE IOHEXOL 350 MG/ML SOLN COMPARISON:  PA and lateral chest yesterday, and chest, abdomen and pelvis CT with IV contrast 04/29/2020. FINDINGS: Cardiovascular: The cardiac size is normal. There is no pericardial effusion. Three-vessel coronary artery calcifications greatest in the LAD, additional scattered calcification in the left  main coronary artery. There is moderate calcification and thickening of the aortic valve leaflets. Consider echocardiographic evaluation. The aorta is normal in caliber with mild tortuosity and moderate calcific plaques, with atherosclerosis in the great vessels. Pulmonary arteries are normal in caliber without evidence of thromboemboli. The pulmonary veins are slightly distended but no more than previously. Mediastinum/Nodes: No enlarged mediastinal, hilar, or axillary lymph nodes. Thyroid gland, trachea, and esophagus demonstrate no significant findings. There is a small hiatal hernia. Lungs/Pleura: There is right-greater-than-left biapical pleural-parenchymal scarring, with right apical volume loss and upward hilar retraction and left apical linear calcifications within the scarring. On the right scarring changes merge with coarsely nodular chronic scar-like opacities. There are mild centrilobular emphysematous changes in the lungs. Anteriorly in the base of the right upper lobe there is new demonstration of a lobular solid nodule with pleural stranding measuring 2.1 x 2 x 1.8 cm (measured on 5:56 and 7:32). This is highly worrisome for a primary bronchogenic neoplasm. PET-CT or tissue sampling recommended. In the left upper lobe a new nodule is also noted on 5:34, with slight cavitation and measuring 8 x 6 mm. This could be a neoplasm or an inflammatory nodule. In the left  lower lobe, there is a chronic stable 4 mm nodule on 5:97. There are scattered linear scar-like opacities in the bases. There is no consolidation, effusion or further nodules. Upper Abdomen: No acute abnormality. Status post cholecystectomy with chronically prominent common bile duct. Abdominal aortic atherosclerosis. Musculoskeletal: There are multilevel thoracic spine chronic compression fractures, with old kyphoplasty at T9 and 12. Osteopenia and degenerative change with thoracic kyphosis. No new or progressive thoracic compression  fractures. The ribcage is intact. No destructive bone lesion. Bilateral breast implants noted with chronic collapse of the left implant. Review of the MIP images confirms the above findings. IMPRESSION: 1. No evidence of arterial dilatation or thromboembolism. 2. 2.1 x 2 x 1.8 cm lobular solid nodule with pleural stranding in the base of the right upper lobe anteriorly, highly worrisome for a primary bronchogenic neoplasm. PET-CT or tissue sampling recommended. 3. Additional new 8 x 6 mm cavitary nodule in the left upper lobe, could be a neoplasm or an inflammatory nodule. 4. Emphysema. 5. Aortic and coronary artery atherosclerosis. 6. Moderate calcification and thickening of the aortic valve leaflets. Consider echocardiographic evaluation. 7. Small hiatal hernia. 8. Osteopenia and degenerative change with multilevel thoracic spine chronic compression fractures, with old kyphoplasty at T9 and 12. 9. 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 Aortic Atherosclerosis (ICD10-I70.0) and Emphysema (ICD10-J43.9). Electronically Signed   By: Almira Bar M.D.   On: 08/14/2023 01:21   DG Chest 2 View Result Date: 08/13/2023 CLINICAL DATA:  Shortness of breath. EXAM: CHEST - 2 VIEW COMPARISON:  07/04/2013.  Chest CT dated 04/29/2020. FINDINGS: Enlarged cardiac silhouette. Hyperexpanded lungs. Interval nodular density overlying the anterior upper thorax on the lateral view, not seen on the frontal view. No significant change in right greater than left biapical pleural and parenchymal scarring. Multiple thoracic and lumbar compression deformities are again demonstrated with kyphoplasty at 2 levels. Diffuse osteopenia. Mild-to-moderate dextroconvex thoracolumbar scoliosis. Cholecystectomy clips. Atheromatous aortic calcifications. IMPRESSION: 1. Interval nodular density overlying the anterior upper thorax on the lateral view, not seen on the frontal view. Recommend chest CT for further evaluation. 2. COPD. 3. Cardiomegaly.  Electronically Signed   By: Beckie Salts M.D.   On: 08/13/2023 19:39    ECHO as above  TELEMETRY reviewed by me 08/29/2023: Sinus rhythm rate 80-90s  EKG reviewed by me: Atrial fibrillation rate 119 bpm  Data reviewed by me 08/29/2023: last 24h vitals tele labs imaging I/O hospitalist progress note  Principal Problem:   Cardiogenic shock (HCC) Active Problems:   Chronic diastolic CHF (congestive heart failure) (HCC)   Mass of upper lobe of right lung   COPD (chronic obstructive pulmonary disease) (HCC)   Dementia with behavioral disturbance (HCC)   Protein-calorie malnutrition, severe (HCC)   Hypokalemia   Hypophosphatemia   Thrombocytopenia (HCC)   Hypernatremia   Septic shock (HCC)   Aspiration pneumonia of both lower lobes due to gastric secretions (HCC)    ASSESSMENT AND PLAN:  Robin Graves is a 83 y.o. female  with a past medical history of COPD, CAD, diastolic CHF, CVA, hypothyroidism, GERD, and dementia who presents with hypotension and atrial fibrillation who presented to the ED on 08/26/2023 for hypotension.  EMS was called to her home after family had not heard from her in a few days.  Found to be hypotensive and in A-fib RVR with heart rate in the 170s.  Cardiology was consulted for further evaluation.   # Atrial fibrillation RVR # NSTEMI, suspect type II # Shock, unclear  etiology Patient found down in her home by EMS with significant hypotension and in atrial fibrillation RVR.  She was cardioverted x 4 in the ED and started on IV amiodarone, now maintaining in normal sinus rhythm.  Troponins trended 24 > 159 > 540 > 649 > 763 > 638.  She is without chest pain or anginal symptoms, EKG without acute ST-T changes. Echo with preserved EF, no WMAs.  -Continue eliquis 2.5 mg twice daily for stroke risk reduction (dose appropriate given age >34 and weight <60 kg.  Continue aspirin 81 mg daily. -Continue amiodarone 400 mg twice daily for 9 more days then will decrease to 200 mg  daily. -No plan for further cardiac diagnostics at this time.   -Would recommend repeat echocardiogram in about 1 month after discharge.  Will sign off. Ok for discharge today from a cardiac perspective. Will arrange for follow up in clinic with Dr. Darrold Junker in 1-2 weeks.    This patient's plan of care was discussed and created with Dr. Corky Sing and he is in agreement.  Signed: Gale Journey, PA-C  08/29/2023, 10:55 AM [facility]

## 2023-08-29 NOTE — Progress Notes (Signed)
 Progress Note   Patient: Robin Graves YNW:[id number] DOB: [birth year] DOA: 08/26/2023     3 DOS: the patient was seen and examined on 08/29/2023   Brief hospital course: Dorsal Madan is a 83 year old female with history of COPD, coronary disease, diastolic congestive heart failure, history of CVA, hypothyroidism, dementia, gastroesophageal reflux disease she was brought to hospital by family member, she had a significant hypotension and atrial fibrillation with RVR.  She was found down at home and not responding to calls for a few hours. Patient was given fluids, patient was also placed on phenylephrine.  Antibiotics was started.   Principal Problem:   Cardiogenic shock (HCC) Active Problems:   COPD (chronic obstructive pulmonary disease) (HCC)   Chronic diastolic CHF (congestive heart failure) (HCC)   Mass of upper lobe of right lung   Dementia with behavioral disturbance (HCC)   Protein-calorie malnutrition, severe (HCC)   Hypokalemia   Hypophosphatemia   Thrombocytopenia (HCC)   Hypernatremia   Septic shock (HCC)   Aspiration pneumonia of both lower lobes due to gastric secretions (HCC)   Assessment and Plan:  Septic shock secondary to aspiration pneumonia. Cardiogenic shock ruled out. Aspiration pneumonia bilateral lower lobes. Dysphagia. Acute hypoxemic respiratory failure secondary to aspiration pneumonia. Acute metabolic encephalopathy. Patient came to the hospital with tachycardia, tachypnea and hypotension.  She also had a severe leukocytosis with white cell count of 23.5, lactic acidosis of 2.4. Initial chest x-ray did not show acute changes, repeat chest x-ray after fluids showed bilateral lower lobe diffuse opacities.  Patient also has history of dysphagia. Patient was also having significant confusion and agitation at the time of admission, seems to be better today. Patient condition is consistent with aspiration pneumonia causing septic shock and acute respiratory  failure. Blood cultures so far no growth, MRSA culture negative. Will continue Unasyn for 5 days. Condition had improved.   Acute kidney injury secondary to septic shock and dehydration. Hypernatremia secondary to dehydration. Severe hypokalemia. Hypophosphatemia. Hypomagnesemia. Normal anion gap metabolic acidosis. Potassium and sodium level has normalized.  Metabolic acidosis resolved.   Phosphate still low at 1.5, give additional sodium phosphate 30 mmol today and repeat level tomorrow.  Give 2 g of magnesium sulfate for mag level of 1.6.  Severe dysphagia likely due to esophagitis. Patient has significant lower chest pain with swallowing, has not been able to eat for about a week.  Patient most likely has a severe esophagitis, will start IV Protonix, oral sucralfate.  There is a concern for fungal esophagitis, will be given nystatin.  I will also obtain a barium esophagram to evaluate for esophageal stenosis.    Elevated troponin secondary to septic shock. Seen by cardiology, no additional workup.   Thrombocytopenia secondary to septic shock. Anemia of chronic disease. Continue to follow.   Hypoglycemia secondary to poor p.o. intake. Failure to thrive. Severe protein calorie malnutrition. Patient not able to eat due to severe esophagitis.  Will treat esophagitis, at meantime, start protein supplements.   Dementia History of CVA. Continue to follow.        Subjective:  Patient still complaining chest pain with swallowing.  Short of breath had improved.  Physical Exam: Vitals:   08/29/23 0347 08/29/23 0351 08/29/23 0500 08/29/23 0759  BP:  120/64  121/65  Pulse:  83  81  Resp:  20    Temp:  97.7 F (36.5 C)  (!) 97.3 F (36.3 C)  TempSrc:  Oral  Oral  SpO2:  96%  96%  Weight: 40.6 kg  40.3 kg   Height:       General exam: Appears calm and comfortable, severely malnourished. Respiratory system: Decreased breath sounds. Respiratory effort  normal. Cardiovascular system: S1 & S2 heard, RRR. No JVD, murmurs, rubs, gallops or clicks. No pedal edema. Gastrointestinal system: Abdomen is nondistended, soft and nontender. No organomegaly or masses felt. Normal bowel sounds heard. Central nervous system: Alert and oriented x3. No focal neurological deficits. Extremities: Significant muscle atrophy. Skin: No rashes, lesions or ulcers Psychiatry: Mood & affect appropriate.    Data Reviewed:  Lab results reviewed.  Family Communication: None  Disposition: Status is: Inpatient Remains inpatient appropriate because: Severity of disease, IV treatment.     Time spent: 50 minutes  Author: Marrion Coy, MD 08/29/2023 10:58 AM  For on call review [email or web address].

## 2023-08-29 NOTE — Care Management Important Message (Signed)
 Important Message  Patient Details  Name: Robin Graves MRN: [id number] Date of Birth: 1941-05-04   Important Message Given:  Yes - Medicare IM     Cristela Blue, CMA 08/29/2023, 10:32 AM

## 2023-08-29 NOTE — Progress Notes (Addendum)
 0800 patient alert x3 on room air asking to go home but refusing mobility to chair able to move self in bed. patient educated that mobility is important for discharge patient  states her daughter will help her at home she doesn't need to walk around or to be able to do things for herself. Patients daughter doesn't live with patient.  1200 patient placed back in bed to go to radiology

## 2023-08-29 NOTE — Plan of Care (Signed)

## 2023-08-29 NOTE — Evaluation (Signed)
 Occupational Therapy Evaluation Patient Details Name: Robin Graves MRN: [id number] DOB: 1941/06/18 Today's Date: 08/29/2023   History of Present Illness   Dorsal Propes is a 83 year old female with history of COPD, coronary disease, diastolic congestive heart failure, history of CVA, hypothyroidism, dementia, gastroesophageal reflux disease she was brought to hospital by family member, she had a significant hypotension and atrial fibrillation with RVR.  She was found down at home for about a few hours. She was admitted with septic shock secondary to asiration pneumonia, acute hypoxemic respiratory failure, acute metabolic encephalopathy, acute kidney injury.     Clinical Impressions Pt was seen for OT evaluation this date. Prior to hospital admission, pt reports being independent with mobility and ADL. Pt lives alone but notes she intends to have family assist her upon discharge (sister, daughter). No family present to verify assist available. Pt presents to acute OT demonstrating impaired ADL performance and functional mobility 2/2 decreased strength, activity tolerance, and balance (See OT problem list for additional functional deficits). Pt currently requires supv for ADL transfers, PRN assist for LB ADL and IADL. Pt would benefit from skilled OT services to address noted impairments and functional limitations (see below for any additional details) in order to maximize safety and independence and minimize functional decline.    If plan is discharge home, recommend the following:   A little help with bathing/dressing/bathroom;Assistance with cooking/housework;Assist for transportation;Help with stairs or ramp for entrance     Functional Status Assessment   Patient has had a recent decline in their functional status and demonstrates the ability to make significant improvements in function in a reasonable and predictable amount of time.     Equipment Recommendations   None recommended by  OT     Recommendations for Other Services         Precautions/Restrictions   Precautions Precautions: Fall Restrictions Weight Bearing Restrictions Per Provider Order: No     Mobility Bed Mobility               General bed mobility comments: NT, in recliner    Transfers                   General transfer comment: pt declined, per PT session prior to OT, pt require supv      Balance Overall balance assessment: Needs assistance Sitting-balance support: Bilateral upper extremity supported, Feet unsupported Sitting balance-Leahy Scale: Good                                     ADL either performed or assessed with clinical judgement   ADL                                         General ADL Comments: Pt demo'd LB dressing requiring Supv for LB, indep with seated UB     Vision         Perception         Praxis         Pertinent Vitals/Pain Pain Assessment Pain Assessment: No/denies pain     Extremity/Trunk Assessment Upper Extremity Assessment Upper Extremity Assessment: Generalized weakness   Lower Extremity Assessment Lower Extremity Assessment: Generalized weakness   Cervical / Trunk Assessment Cervical / Trunk Assessment: Kyphotic   Communication Communication Communication: No apparent difficulties  Cognition Arousal: Alert Behavior During Therapy: WFL for tasks assessed/performed Cognition: No family/caregiver present to determine baseline             OT - Cognition Comments: appropriate, follows commands                 Following commands: Intact       Cueing  General Comments          Exercises Other Exercises Other Exercises: Pt educated in home/routines modifications to improve safety   Shoulder Instructions      Home Living Family/patient expects to be discharged to:: Private residence Living Arrangements: Alone Available Help at Discharge: Family;Available  PRN/intermittently;Friend(s) (pt states her freinds help her intermittantly but she might be able to stay with a relative, especially her daughter) Type of Home: Mobile home Home Access: Stairs to enter Entrance Stairs-Number of Steps: 2 Entrance Stairs-Rails: Right;Left;Can reach both Home Layout: One level     Bathroom Shower/Tub: Walk-in shower         Home Equipment: Scientist, research (life sciences) (2 wheels);Cane - single point;Shower seat;BSC/3in1;Grab bars - tub/shower;Hand held shower head          Prior Functioning/Environment Prior Level of Function : Independent/Modified Independent;Driving             Mobility Comments: patient reports that prior to hospitalization she was I with mobility with no AD. She denies falls in the last 6 months and states she was driving. ADLs Comments: Patient state she was I with ADLs but then said her neighbors help her out with anything that she needs. She said recently they have been helping her get out of bed and get food.    OT Problem List: Decreased strength;Impaired balance (sitting and/or standing);Decreased activity tolerance;Decreased knowledge of use of DME or AE   OT Treatment/Interventions: Self-care/ADL training;Therapeutic exercise;Therapeutic activities;DME and/or AE instruction;Patient/family education;Balance training      OT Goals(Current goals can be found in the care plan section)   Acute Rehab OT Goals Patient Stated Goal: go home OT Goal Formulation: With patient Time For Goal Achievement: 09/12/23 Potential to Achieve Goals: Good ADL Goals Pt Will Transfer to Toilet: with modified independence;ambulating (LRAD) Pt Will Perform Toileting - Clothing Manipulation and hygiene: with modified independence Additional ADL Goal #1: Pt will complete all aspects of showering primarily from seated position with mod indep, 2/2 opportunities. Additional ADL Goal #2: Pt will verbalize plan to implement at least 1 learned falls  prevention strategy to maximize safety.   OT Frequency:  Min 1X/week    Co-evaluation              AM-PAC OT "6 Clicks" Daily Activity     Outcome Measure Help from another person eating meals?: None Help from another person taking care of personal grooming?: None Help from another person toileting, which includes using toliet, bedpan, or urinal?: A Little Help from another person bathing (including washing, rinsing, drying)?: A Little Help from another person to put on and taking off regular upper body clothing?: None Help from another person to put on and taking off regular lower body clothing?: A Little 6 Click Score: 21   End of Session    Activity Tolerance: Patient tolerated treatment well Patient left: in chair;with call bell/phone within reach;with chair alarm set  OT Visit Diagnosis: Other abnormalities of gait and mobility (R26.89);Muscle weakness (generalized) (M62.81)                Time: 1610-9604 OT Time Calculation (  min): 11 min Charges:  OT General Charges $OT Visit: 1 Visit OT Evaluation $OT Eval Low Complexity: 1 Low  Arman Filter., MPH, MS, OTR/L ascom [phone] 08/29/23, 11:14 AM

## 2023-08-30 DIAGNOSIS — A419 Sepsis, unspecified organism: Secondary | ICD-10-CM

## 2023-08-30 DIAGNOSIS — E43 Unspecified severe protein-calorie malnutrition: Secondary | ICD-10-CM | POA: Diagnosis not present

## 2023-08-30 DIAGNOSIS — R6521 Severe sepsis with septic shock: Secondary | ICD-10-CM

## 2023-08-30 DIAGNOSIS — J441 Chronic obstructive pulmonary disease with (acute) exacerbation: Secondary | ICD-10-CM

## 2023-08-30 DIAGNOSIS — J690 Pneumonitis due to inhalation of food and vomit: Secondary | ICD-10-CM | POA: Diagnosis not present

## 2023-08-30 LAB — CBC
HCT: 27 % — ABNORMAL LOW (ref 36.0–46.0)
Hemoglobin: 9.3 g/dL — ABNORMAL LOW (ref 12.0–15.0)
MCH: 31 pg (ref 26.0–34.0)
MCHC: 34.4 g/dL (ref 30.0–36.0)
MCV: 90 fL (ref 80.0–100.0)
Platelets: 124 10*3/uL — ABNORMAL LOW (ref 150–400)
RBC: 3 MIL/uL — ABNORMAL LOW (ref 3.87–5.11)
RDW: 14.4 % (ref 11.5–15.5)
WBC: 11.3 10*3/uL — ABNORMAL HIGH (ref 4.0–10.5)
nRBC: 0 % (ref 0.0–0.2)

## 2023-08-30 LAB — BASIC METABOLIC PANEL
Anion gap: 7 (ref 5–15)
BUN: 11 mg/dL (ref 8–23)
CO2: 26 mmol/L (ref 22–32)
Calcium: 7.8 mg/dL — ABNORMAL LOW (ref 8.9–10.3)
Chloride: 115 mmol/L — ABNORMAL HIGH (ref 98–111)
Creatinine, Ser: 0.72 mg/dL (ref 0.44–1.00)
GFR, Estimated: >60 mL/min (ref >60)
Glucose, Bld: 80 mg/dL (ref 70–99)
Potassium: 2.9 mmol/L — ABNORMAL LOW (ref 3.5–5.1)
Sodium: 148 mmol/L — ABNORMAL HIGH (ref 135–145)

## 2023-08-30 LAB — PHOSPHORUS: Phosphorus: 4 mg/dL (ref 2.5–4.6)

## 2023-08-30 LAB — MAGNESIUM: Magnesium: 2.1 mg/dL (ref 1.7–2.4)

## 2023-08-30 MED ORDER — POTASSIUM CHLORIDE 10 MEQ/100ML IV SOLN
10.0000 meq | INTRAVENOUS | Status: AC
Start: 2023-08-30 — End: 2023-08-30
  Filled 2023-08-30 (×2): qty 100

## 2023-08-30 MED ORDER — POTASSIUM CL IN DEXTROSE 5% 20 MEQ/L IV SOLN
20.0000 meq | INTRAVENOUS | Status: DC
  Administered 2023-08-30: 20 meq via INTRAVENOUS
  Filled 2023-08-30: qty 1000

## 2023-08-30 MED ORDER — POTASSIUM CL IN DEXTROSE 5% 20 MEQ/L IV SOLN
20.0000 meq | INTRAVENOUS | Status: DC
  Administered 2023-08-30: 20 meq via INTRAVENOUS
  Filled 2023-08-30 (×2): qty 1000

## 2023-08-30 MED ORDER — SPIRONOLACTONE 25 MG PO TABS
25.0000 mg | ORAL_TABLET | Freq: Once | ORAL | Status: AC
  Administered 2023-08-30: 25 mg via ORAL
  Filled 2023-08-30: qty 1

## 2023-08-30 MED ORDER — PREDNISONE 20 MG PO TABS
40.0000 mg | ORAL_TABLET | Freq: Every day | ORAL | Status: DC
  Administered 2023-08-30 – 2023-09-01 (×3): 40 mg via ORAL
  Filled 2023-08-30 (×3): qty 2

## 2023-08-30 MED ORDER — POTASSIUM CHLORIDE 20 MEQ PO PACK
40.0000 meq | PACK | Freq: Two times a day (BID) | ORAL | Status: AC
  Administered 2023-08-30 (×2): 40 meq via ORAL
  Filled 2023-08-30 (×2): qty 2

## 2023-08-30 MED ORDER — POTASSIUM CHLORIDE 10 MEQ/100ML IV SOLN
10.0000 meq | INTRAVENOUS | Status: DC
  Administered 2023-08-30: 10 meq via INTRAVENOUS
  Filled 2023-08-30 (×2): qty 100

## 2023-08-30 NOTE — TOC Progression Note (Signed)
 Transition of Care [facility] - Progression Note    Patient Details  Name: Robin Graves MRN: [id number] Date of Birth: [birth year]  Transition of Care [facility] CM/SW Contact  Truddie Hidden, RN Phone Number: 08/30/2023, 2:25 PM  Clinical Narrative:    Patient daughter, Robin Graves, request phone call. Attempt to reach [place]. No answer. Left a message.         Expected Discharge Plan and Services                                               Social Determinants of Health (SDOH) Interventions SDOH Screenings   Food Insecurity: Patient Unable To Answer (08/26/2023)  Housing: Patient Unable To Answer (08/26/2023)  Transportation Needs: Patient Unable To Answer (08/26/2023)  Utilities: Patient Unable To Answer (08/26/2023)  Financial Resource Strain: Low Risk  (08/21/2023)   Received from [facility]  Social Connections: Unknown (08/26/2023)  Tobacco Use: Medium Risk (08/22/2023)    Readmission Risk Interventions     No data to display

## 2023-08-30 NOTE — Progress Notes (Signed)
 PT Cancellation Note  Patient Details Name: Robin Graves MRN: [id number] DOB: 1941/02/28   Cancelled Treatment:    Reason Eval/Treat Not Completed: Other (comment).  Pt resting in bed upon PT arrival.  Pt declining therapy d/t pt stating that her L arm was burning too much (pt with IV potassium running) and couldn't participate in therapy d/t this--pt's nurse notified.  Will re-attempt PT session at a later date/time.  Hendricks Limes, PT 08/30/23, 11:56 AM

## 2023-08-30 NOTE — Progress Notes (Signed)
 Progress Note   Patient: Robin Graves BJY:[id number] DOB: 11-22-1940 DOA: 08/26/2023     4 DOS: the patient was seen and examined on 08/30/2023   Brief hospital course: Robin Graves is a 83 year old female with history of COPD, coronary disease, diastolic congestive heart failure, history of CVA, hypothyroidism, dementia, gastroesophageal reflux disease she was brought to hospital by family member, she had a significant hypotension and atrial fibrillation with RVR.  She was found down at home and not responding to calls for a few hours. Patient was given fluids, patient was also placed on phenylephrine.  Antibiotics was started. Patient became hemodynamically stable on 2/19.     Active Problems:   COPD (chronic obstructive pulmonary disease) (HCC)   Chronic diastolic CHF (congestive heart failure) (HCC)   Mass of upper lobe of right lung   Dementia with behavioral disturbance (HCC)   Protein-calorie malnutrition, severe (HCC)   COPD with acute exacerbation (HCC)   Hypokalemia   Hypophosphatemia   Thrombocytopenia (HCC)   Hypernatremia   Septic shock (HCC)   Aspiration pneumonia of both lower lobes due to gastric secretions (HCC)   Esophageal dysphagia   Assessment and Plan: Septic shock secondary to aspiration pneumonia. Cardiogenic shock ruled out. Aspiration pneumonia bilateral lower lobes. Dysphagia. Acute hypoxemic respiratory failure secondary to aspiration pneumonia. Acute metabolic encephalopathy. Patient came to the hospital with tachycardia, tachypnea and hypotension.  She also had a severe leukocytosis with white cell count of 23.5, lactic acidosis of 2.4. Initial chest x-ray did not show acute changes, repeat chest x-ray after fluids showed bilateral lower lobe diffuse opacities.  Patient also has history of dysphagia. Patient was also having significant confusion and agitation at the time of admission, seems to be better. Patient condition is consistent with aspiration  pneumonia causing septic shock and acute respiratory failure. Blood cultures so far no growth, MRSA culture negative. Will continue Unasyn for 5 days. Patient has more bronchospasm today, treated for COPD exacerbation.  COPD exacerbation.  Probably POA. Patient had increased wheezing today, continue bronchodilator, added steroids.   Acute kidney injury secondary to septic shock and dehydration. Hypernatremia secondary to dehydration. Severe hypokalemia. Hypophosphatemia. Hypomagnesemia. Normal anion gap metabolic acidosis. Patient had worsening hypernatremia and hypokalemia today.  This is due to poor p.o. intake and dehydration.  Magnesium and phosphate normalized. Patient will be given more potassium, will be also placed on D5 water. I also give a dose of Aldactone.   Severe dysphagia likely due to esophagitis. Patient has significant lower chest pain with swallowing, has not been able to eat for about a week.  Patient most likely has a severe esophagitis, will start IV Protonix, oral sucralfate.  There is a concern for fungal esophagitis,  given nystatin.  Barium esophagus showed severe esophageal dysmotility.  Patient is able to eat today, but appetite is still poor.  Continue current treatment.  Elevated troponin secondary to septic shock. Seen by cardiology, no additional workup.   Thrombocytopenia secondary to septic shock. Anemia of chronic disease. Continue to follow.   Hypoglycemia secondary to poor p.o. intake. Failure to thrive. Severe protein calorie malnutrition. Continue protein supplement, patient started eating today.   Dementia History of CVA. Continue to follow.   Right upper lobe mass, most likely lung cancer.   Prognosis poor.      Subjective:  Patient feels better with breathing, but is still wheezing. Chest pain with swallowing is seem to be better today, patient started eating.  No nausea vomiting.  Had  a normal bowel movement.  Physical  Exam: Vitals:   08/30/23 0004 08/30/23 0349 08/30/23 0457 08/30/23 0808  BP: 119/82 126/72  129/73  Pulse: 91 81  98  Resp: 18 18  16   Temp: 98 F (36.7 C) 98.3 F (36.8 C)  97.7 F (36.5 C)  TempSrc:    Oral  SpO2: 93% 94%  99%  Weight:   40.5 kg   Height:       General exam: Appears calm and comfortable, cachectic. Respiratory system: Diffuse rhonchi in the base. Respiratory effort normal. Cardiovascular system: S1 & S2 heard, RRR. No JVD, murmurs, rubs, gallops or clicks. No pedal edema. Gastrointestinal system: Abdomen is nondistended, soft and nontender. No organomegaly or masses felt. Normal bowel sounds heard. Central nervous system: Alert and oriented x3. No focal neurological deficits. Extremities: Severe muscle atrophy. Skin: No rashes, lesions or ulcers Psychiatry:  Mood & affect appropriate.    Data Reviewed:  Lab results reviewed.  Family Communication: Daughter updated over the phone.  Disposition: Status is: Inpatient Remains inpatient appropriate because: Severity of disease. IV Treatment.     Time spent: 55 minutes  Author: Marrion Coy, MD 08/30/2023 10:08 AM  For on call review [email or web address].

## 2023-08-30 NOTE — Progress Notes (Signed)
 Attempted to call report to [facility], nurse unavailable.

## 2023-08-31 DIAGNOSIS — A419 Sepsis, unspecified organism: Secondary | ICD-10-CM | POA: Diagnosis not present

## 2023-08-31 DIAGNOSIS — J690 Pneumonitis due to inhalation of food and vomit: Secondary | ICD-10-CM | POA: Diagnosis not present

## 2023-08-31 DIAGNOSIS — J441 Chronic obstructive pulmonary disease with (acute) exacerbation: Secondary | ICD-10-CM | POA: Diagnosis not present

## 2023-08-31 DIAGNOSIS — R6521 Severe sepsis with septic shock: Secondary | ICD-10-CM | POA: Diagnosis not present

## 2023-08-31 LAB — CBC
HCT: 24.7 % — ABNORMAL LOW (ref 36.0–46.0)
Hemoglobin: 8.4 g/dL — ABNORMAL LOW (ref 12.0–15.0)
MCH: 30.7 pg (ref 26.0–34.0)
MCHC: 34 g/dL (ref 30.0–36.0)
MCV: 90.1 fL (ref 80.0–100.0)
Platelets: 118 10*3/uL — ABNORMAL LOW (ref 150–400)
RBC: 2.74 MIL/uL — ABNORMAL LOW (ref 3.87–5.11)
RDW: 14.5 % (ref 11.5–15.5)
WBC: 10.6 10*3/uL — ABNORMAL HIGH (ref 4.0–10.5)
nRBC: 0 % (ref 0.0–0.2)

## 2023-08-31 LAB — BASIC METABOLIC PANEL
Anion gap: 5 (ref 5–15)
BUN: 13 mg/dL (ref 8–23)
CO2: 25 mmol/L (ref 22–32)
Calcium: 8.1 mg/dL — ABNORMAL LOW (ref 8.9–10.3)
Chloride: 113 mmol/L — ABNORMAL HIGH (ref 98–111)
Creatinine, Ser: 0.71 mg/dL (ref 0.44–1.00)
GFR, Estimated: >60 mL/min (ref >60)
Glucose, Bld: 126 mg/dL — ABNORMAL HIGH (ref 70–99)
Potassium: 3.4 mmol/L — ABNORMAL LOW (ref 3.5–5.1)
Sodium: 143 mmol/L (ref 135–145)

## 2023-08-31 LAB — CULTURE, BLOOD (ROUTINE X 2)
Culture: NO GROWTH
Culture: NO GROWTH

## 2023-08-31 LAB — MAGNESIUM: Magnesium: 2 mg/dL (ref 1.7–2.4)

## 2023-08-31 LAB — PHOSPHORUS: Phosphorus: 2.2 mg/dL — ABNORMAL LOW (ref 2.5–4.6)

## 2023-08-31 MED ORDER — DRONABINOL 2.5 MG PO CAPS
2.5000 mg | ORAL_CAPSULE | Freq: Two times a day (BID) | ORAL | Status: DC
  Administered 2023-08-31 – 2023-09-01 (×3): 2.5 mg via ORAL
  Filled 2023-08-31 (×3): qty 1

## 2023-08-31 MED ORDER — POTASSIUM & SODIUM PHOSPHATES 280-160-250 MG PO PACK
1.0000 | PACK | Freq: Three times a day (TID) | ORAL | Status: AC
  Administered 2023-08-31 (×4): 1 via ORAL
  Filled 2023-08-31 (×4): qty 1

## 2023-08-31 MED ORDER — POTASSIUM CHLORIDE 20 MEQ PO PACK
40.0000 meq | PACK | Freq: Two times a day (BID) | ORAL | Status: AC
  Administered 2023-08-31 (×2): 40 meq via ORAL
  Filled 2023-08-31 (×2): qty 2

## 2023-08-31 NOTE — Progress Notes (Signed)
 Physical Therapy Treatment Patient Details Name: Robin Graves MRN: [id number] DOB: [birth year] Today's Date: 08/31/2023   History of Present Illness Dorsal Aleman is a 83 year old female with history of COPD, coronary disease, diastolic congestive heart failure, history of CVA, hypothyroidism, dementia, gastroesophageal reflux disease she was brought to hospital by family member, she had a significant hypotension and atrial fibrillation with RVR.  She was found down at home for about a few hours. She was admitted with septic shock secondary to asiration pneumonia, acute hypoxemic respiratory failure, acute metabolic encephalopathy, acute kidney injury.    PT Comments  Pt reports not feeling well today and only agreed to walk to/from the door in her room, requesting to lie back down reporting fatigue. Pt demonstrates Mod I with bed mobility and Supervision level with tranfers and gait with RW (20').  Pt is limited with mobility demonstrating poor activity tolerance.  Pt denied SOB or light headedness during mobility SPO2 RA 93% and HR 76bpm.  Continued PT will assist pt towards greater activity tolerance and standing balance to increase safety and functional independence with mobility.   If plan is discharge home, recommend the following: A little help with bathing/dressing/bathroom;Help with stairs or ramp for entrance;Assist for transportation;Assistance with cooking/housework   Can travel by private vehicle     Yes  Equipment Recommendations  None recommended by PT    Recommendations for Other Services       Precautions / Restrictions Precautions Precautions: Fall Restrictions Weight Bearing Restrictions Per Provider Order: No     Mobility  Bed Mobility Overal bed mobility: Modified Independent Bed Mobility: Supine to Sit, Sit to Supine     Supine to sit: Modified independent (Device/Increase time) Sit to supine: Modified independent (Device/Increase time)         Transfers Overall transfer level: Needs assistance Equipment used: Rolling walker (2 wheels) Transfers: Sit to/from Stand, Bed to chair/wheelchair/BSC Sit to Stand: Supervision   Step pivot transfers: Supervision            Ambulation/Gait Ambulation/Gait assistance: Supervision Gait Distance (Feet): 20 Feet Assistive device: Rolling walker (2 wheels) Gait Pattern/deviations: Trunk flexed, Step-through pattern Gait velocity: decr.     General Gait Details: Pt was not feeling well today and only agreed to walk to/from the door in her room, requesting to lie back down reporting fatigue.   Stairs             Wheelchair Mobility     Tilt Bed    Modified Rankin (Stroke Patients Only)       Balance Overall balance assessment: Needs assistance Sitting-balance support: No upper extremity supported, Feet supported Sitting balance-Leahy Scale: Good Sitting balance - Comments: steady sitting at edge of bed fatigues easily   Standing balance support: During functional activity, Bilateral upper extremity supported, Reliant on assistive device for balance Standing balance-Leahy Scale: Fair Standing balance comment: Patient reliant on RW or other UE support during dynamic mobility                            Communication Communication Communication: No apparent difficulties  Cognition   Behavior During Therapy: WFL for tasks assessed/performed   PT - Cognitive impairments: No family/caregiver present to determine baseline                       PT - Cognition Comments: has diagnosis of dementia Following commands: Intact  Cueing Cueing Techniques: Verbal cues  Exercises      General Comments        Pertinent Vitals/Pain Pain Assessment Pain Assessment: No/denies pain    Home Living                          Prior Function            PT Goals (current goals can now be found in the care plan section) Acute Rehab  PT Goals Patient Stated Goal: to go home PT Goal Formulation: With patient Time For Goal Achievement: 09/11/23 Potential to Achieve Goals: Good Progress towards PT goals: Progressing toward goals    Frequency    Min 1X/week      PT Plan      Co-evaluation              AM-PAC PT "6 Clicks" Mobility   Outcome Measure  Help needed turning from your back to your side while in a flat bed without using bedrails?: None Help needed moving from lying on your back to sitting on the side of a flat bed without using bedrails?: None Help needed moving to and from a bed to a chair (including a wheelchair)?: A Little Help needed standing up from a chair using your arms (e.g., wheelchair or bedside chair)?: A Little Help needed to walk in hospital room?: A Little Help needed climbing 3-5 steps with a railing? : A Little 6 Click Score: 20    End of Session Equipment Utilized During Treatment: Gait belt Activity Tolerance: Patient limited by fatigue Patient left: in bed;with bed alarm set;with call bell/phone within reach Nurse Communication: Mobility status PT Visit Diagnosis: Muscle weakness (generalized) (M62.81);Other abnormalities of gait and mobility (R26.89)     Time: 6644-0347 PT Time Calculation (min) (ACUTE ONLY): 16 min  Charges:    $Therapeutic Activity: 8-22 mins PT General Charges $$ ACUTE PT VISIT: 1 Visit                     Hortencia Conradi, PTA  08/31/23, 12:29 PM

## 2023-08-31 NOTE — Progress Notes (Signed)
 Progress Note   Patient: Robin Graves:[id number] DOB: [birth year] DOA: 08/26/2023     5 DOS: the patient was seen and examined on 08/31/2023   Brief hospital course: Robin Graves is a 83 year old female with history of COPD, coronary disease, diastolic congestive heart failure, history of CVA, hypothyroidism, dementia, gastroesophageal reflux disease she was brought to hospital by family member, she had a significant hypotension and atrial fibrillation with RVR.  She was found down at home and not responding to calls for a few hours. Patient was given fluids, patient was also placed on phenylephrine.  Antibiotics was started. Patient became hemodynamically stable on 2/19.     Active Problems:   COPD (chronic obstructive pulmonary disease) (HCC)   Chronic diastolic CHF (congestive heart failure) (HCC)   Mass of upper lobe of right lung   Dementia with behavioral disturbance (HCC)   Protein-calorie malnutrition, severe (HCC)   COPD with acute exacerbation (HCC)   Hypokalemia   Hypophosphatemia   Thrombocytopenia (HCC)   Hypernatremia   Septic shock (HCC)   Aspiration pneumonia of both lower lobes due to gastric secretions (HCC)   Esophageal dysphagia   Assessment and Plan: Septic shock secondary to aspiration pneumonia. Cardiogenic shock ruled out. Aspiration pneumonia bilateral lower lobes. Dysphagia. Acute hypoxemic respiratory failure secondary to aspiration pneumonia. Acute metabolic encephalopathy. Patient came to the hospital with tachycardia, tachypnea and hypotension.  She also had a severe leukocytosis with white cell count of 23.5, lactic acidosis of 2.4. Initial chest x-ray did not show acute changes, repeat chest x-ray after fluids showed bilateral lower lobe diffuse opacities.  Patient also has history of dysphagia. Patient was also having significant confusion and agitation at the time of admission, seems to be better. Patient condition is consistent with aspiration  pneumonia causing septic shock and acute respiratory failure. Blood cultures so far no growth, MRSA culture negative. Will continue Unasyn for 5 days.  Can change to oral Augmentin at discharge if patient discharged earlier.    COPD exacerbation.  Probably POA. Patient had increased wheezing today, continue bronchodilator, added steroids. Bronchospasm is better.   Acute kidney injury secondary to septic shock and dehydration. Hypernatremia secondary to dehydration. Severe hypokalemia. Hypophosphatemia. Hypomagnesemia. Normal anion gap metabolic acidosis. Patient had worsening hypernatremia and hypokalemia today.  This is due to poor p.o. intake and dehydration.  Sodium level also normalized after giving D5 water.  Continue supplement potassium and phosphate.   Severe dysphagia likely due to esophagitis. Patient has significant lower chest pain with swallowing, has not been able to eat for about a week.  Patient most likely has a severe esophagitis, will start IV Protonix, oral sucralfate.  There is a concern for fungal esophagitis,  given nystatin.  Barium esophagus showed severe esophageal dysmotility.  Patient condition is improving after treatment.  Patient is able to tolerate diet without significant pain.  Will continue current treatment at discharge.   Elevated troponin secondary to septic shock. Seen by cardiology, no additional workup.   Thrombocytopenia secondary to septic shock. Anemia of chronic disease. Continue to follow.   Hypoglycemia secondary to poor p.o. intake. Failure to thrive. Severe protein calorie malnutrition. Continue protein supplement, patient started eating today.   Dementia History of CVA. Continue to follow.   Right upper lobe mass, most likely lung cancer.   Prognosis poor.           Subjective:  Patient currently complains of back pain, otherwise able to eat.  But appetite is still poor.  Physical Exam: Vitals:   08/30/23 2125  08/31/23 0316 08/31/23 0737 08/31/23 0745  BP:  128/66 126/64   Pulse:  98 77   Resp:  20 18   Temp:  97.9 F (36.6 C) 98 F (36.7 C)   TempSrc:      SpO2: 93% 93% 95% 94%  Weight:      Height:       General exam: Appears calm and comfortable, cachectic. Respiratory system: Clear to auscultation. Respiratory effort normal. Cardiovascular system: S1 & S2 heard, RRR. No JVD, murmurs, rubs, gallops or clicks. No pedal edema. Gastrointestinal system: Abdomen is nondistended, soft and nontender. No organomegaly or masses felt. Normal bowel sounds heard. Central nervous system: Alert and oriented. No focal neurological deficits. Extremities: Severe muscle atrophy. Skin: No rashes, lesions or ulcers Psychiatry: Judgement and insight appear normal. Mood & affect appropriate.    Data Reviewed:  Lab results reviewed.  Family Communication: None  Disposition: Status is: Inpatient Remains inpatient appropriate because: Severity of disease, plan to discharge home tomorrow with home health and palliative care.     Time spent: 35 minutes  Author: Marrion Coy, MD 08/31/2023 12:20 PM  For on call review [email or web address].

## 2023-08-31 NOTE — Progress Notes (Signed)
 Mobility Specialist - Progress Note   08/31/23 1400  Mobility  Activity Refused mobility     2nd attempt this date. Pt declined mobility twice today. Too fatigued after bath this AM and states she just finished ambulating when attempt this PM. Will attempt another date/time.    Filiberto Pinks Mobility Specialist 08/31/23, 2:28 PM

## 2023-08-31 NOTE — Plan of Care (Signed)

## 2023-09-01 DIAGNOSIS — J441 Chronic obstructive pulmonary disease with (acute) exacerbation: Secondary | ICD-10-CM | POA: Diagnosis not present

## 2023-09-01 DIAGNOSIS — E870 Hyperosmolality and hypernatremia: Secondary | ICD-10-CM | POA: Diagnosis not present

## 2023-09-01 DIAGNOSIS — J690 Pneumonitis due to inhalation of food and vomit: Secondary | ICD-10-CM | POA: Diagnosis not present

## 2023-09-01 DIAGNOSIS — A419 Sepsis, unspecified organism: Secondary | ICD-10-CM | POA: Diagnosis not present

## 2023-09-01 LAB — BASIC METABOLIC PANEL
Anion gap: 7 (ref 5–15)
BUN: 19 mg/dL (ref 8–23)
CO2: 25 mmol/L (ref 22–32)
Calcium: 8.4 mg/dL — ABNORMAL LOW (ref 8.9–10.3)
Chloride: 112 mmol/L — ABNORMAL HIGH (ref 98–111)
Creatinine, Ser: 0.77 mg/dL (ref 0.44–1.00)
GFR, Estimated: >60 mL/min (ref >60)
Glucose, Bld: 78 mg/dL (ref 70–99)
Potassium: 3.2 mmol/L — ABNORMAL LOW (ref 3.5–5.1)
Sodium: 144 mmol/L (ref 135–145)

## 2023-09-01 LAB — PHOSPHORUS: Phosphorus: 2.2 mg/dL — ABNORMAL LOW (ref 2.5–4.6)

## 2023-09-01 LAB — CBC
HCT: 25.2 % — ABNORMAL LOW (ref 36.0–46.0)
Hemoglobin: 8.1 g/dL — ABNORMAL LOW (ref 12.0–15.0)
MCH: 30 pg (ref 26.0–34.0)
MCHC: 32.1 g/dL (ref 30.0–36.0)
MCV: 93.3 fL (ref 80.0–100.0)
Platelets: 124 10*3/uL — ABNORMAL LOW (ref 150–400)
RBC: 2.7 MIL/uL — ABNORMAL LOW (ref 3.87–5.11)
RDW: 14.3 % (ref 11.5–15.5)
WBC: 10.8 10*3/uL — ABNORMAL HIGH (ref 4.0–10.5)
nRBC: 0 % (ref 0.0–0.2)

## 2023-09-01 LAB — MAGNESIUM: Magnesium: 2.1 mg/dL (ref 1.7–2.4)

## 2023-09-01 MED ORDER — AMIODARONE HCL 200 MG PO TABS: ORAL_TABLET | ORAL | 0 refills | Status: DC

## 2023-09-01 MED ORDER — SUCRALFATE 1 GM/10ML PO SUSP: 1.0000 g | Freq: Three times a day (TID) | ORAL | 0 refills | Status: DC

## 2023-09-01 MED ORDER — POTASSIUM CHLORIDE 20 MEQ PO PACK
40.0000 meq | PACK | ORAL | Status: AC
Start: 2023-09-01 — End: 2023-09-01
  Administered 2023-09-01 (×2): 40 meq via ORAL
  Filled 2023-09-01 (×2): qty 2

## 2023-09-01 MED ORDER — NYSTATIN 100000 UNIT/ML MT SUSP: 5.0000 mL | Freq: Four times a day (QID) | OROMUCOSAL | 0 refills | Status: DC

## 2023-09-01 MED ORDER — DRONABINOL 2.5 MG PO CAPS: 2.5000 mg | ORAL_CAPSULE | Freq: Two times a day (BID) | ORAL | 0 refills | Status: DC

## 2023-09-01 MED ORDER — POTASSIUM CHLORIDE 10 MEQ/100ML IV SOLN
10.0000 meq | INTRAVENOUS | Status: AC
  Administered 2023-09-01 (×2): 10 meq via INTRAVENOUS
  Filled 2023-09-01 (×2): qty 100

## 2023-09-01 MED ORDER — POTASSIUM PHOSPHATES 15 MMOLE/5ML IV SOLN
15.0000 mmol | Freq: Once | INTRAVENOUS | Status: AC
  Administered 2023-09-01: 15 mmol via INTRAVENOUS
  Filled 2023-09-01: qty 5

## 2023-09-01 MED ORDER — AMOXICILLIN-POT CLAVULANATE 400-57 MG/5ML PO SUSR: 800.0000 mg | Freq: Two times a day (BID) | ORAL | 0 refills | Status: DC

## 2023-09-01 MED ORDER — PREDNISONE 20 MG PO TABS: 40.0000 mg | ORAL_TABLET | Freq: Every day | ORAL | 0 refills | Status: DC

## 2023-09-01 MED ORDER — APIXABAN 2.5 MG PO TABS: 2.5000 mg | ORAL_TABLET | Freq: Two times a day (BID) | ORAL | 0 refills | Status: DC

## 2023-09-01 NOTE — Plan of Care (Signed)
 PMT shadowing. Notes reviewed. Plans in place for patient to discharge home today. Per notes daughter will be staying with her. PMT will sign off as goals are set.

## 2023-09-01 NOTE — Plan of Care (Signed)

## 2023-09-01 NOTE — TOC Transition Note (Addendum)
 Transition of Care [facility]) - Discharge Note   Patient Details  Name: Robin Graves MRN: [id number] Date of Birth: 1940/11/06  [facility] CM/SW Contact:  Rodney Langton, RN Phone Number: 09/01/2023, 2:06 PM   Clinical Narrative:     Patient with discharge orders, spoke with daughter and she is aware, will provide transportation home.  Inquired about DME that was recommended (wheelchair and walker), daughter report it is at the bedside, will confirm this is at the bedside prior to discharge.  Laurelyn Sickle with [facility] notified that patient will go home today.    Final next level of care: Home w Home Health Services Barriers to Discharge: Barriers Resolved   Patient Goals and CMS Choice Patient states their goals for this hospitalization and ongoing recovery are:: Home with [facility]          Discharge Placement                       Discharge Plan and Services Additional resources added to the After Visit Summary for                  DME Arranged: Dan Humphreys, Wheelchair manual         HH Arranged: PT, OT HH Agency: [facility] Date [facility] Agency Contacted: 09/01/23 Time HH Agency Contacted: [phone] Representative spoke with at [facility] Agency: Laurelyn Sickle  Social Drivers of Health (SDOH) Interventions SDOH Screenings   Food Insecurity: Patient Unable To Answer (08/26/2023)  Housing: Patient Unable To Answer (08/26/2023)  Transportation Needs: Patient Unable To Answer (08/26/2023)  Utilities: Patient Unable To Answer (08/26/2023)  Financial Resource Strain: Low Risk  (08/21/2023)   Received from [facility]  Social Connections: Unknown (08/26/2023)  Tobacco Use: Medium Risk (08/22/2023)     Readmission Risk Interventions     No data to display

## 2023-09-01 NOTE — Discharge Summary (Signed)
 Physician Discharge Summary   Patient: Robin Graves MRN: [id number] DOB: [birth year]  Admit date:     08/26/2023  Discharge date: 09/01/23  Discharge Physician: Marrion Coy   PCP: Barbette Reichmann, MD   Recommendations at discharge:   Follow-up with PCP in 1 week. Refer to palliative care.  May transition to hospice if condition deteriorates.  Discharge Diagnoses: Active Problems:   COPD (chronic obstructive pulmonary disease) (HCC)   Chronic diastolic CHF (congestive heart failure) (HCC)   Mass of upper lobe of right lung   Dementia with behavioral disturbance (HCC)   Protein-calorie malnutrition, severe (HCC)   COPD with acute exacerbation (HCC)   Hypokalemia   Hypophosphatemia   Thrombocytopenia (HCC)   Hypernatremia   Septic shock (HCC)   Aspiration pneumonia of both lower lobes due to gastric secretions (HCC)   Esophageal dysphagia  Resolved Problems:   * No resolved hospital problems. *  Hospital Course: Robin Graves is a 83 year old female with history of COPD, coronary disease, diastolic congestive heart failure, history of CVA, hypothyroidism, dementia, gastroesophageal reflux disease she was brought to hospital by family member, she had a significant hypotension and atrial fibrillation with RVR.  She was found down at home and not responding to calls for a few hours. Patient was given fluids, patient was also placed on phenylephrine.  Antibiotics was started. Patient became hemodynamically stable on 2/19.     Assessment and Plan: Septic shock secondary to aspiration pneumonia. Cardiogenic shock ruled out. Aspiration pneumonia bilateral lower lobes. Dysphagia. Acute hypoxemic respiratory failure secondary to aspiration pneumonia. Acute metabolic encephalopathy. Patient came to the hospital with tachycardia, tachypnea and hypotension.  She also had a severe leukocytosis with white cell count of 23.5, lactic acidosis of 2.4. Initial chest x-ray did not show acute  changes, repeat chest x-ray after fluids showed bilateral lower lobe diffuse opacities.  Patient also has history of dysphagia. Patient was also having significant confusion and agitation at the time of admission, seems to be better. Patient condition is consistent with aspiration pneumonia causing septic shock and acute respiratory failure. Blood cultures so far no growth, MRSA culture negative. She was treated with nystatin, transition to oral Augmentin for additional 2 days.     COPD exacerbation.  Probably POA. Patient had increased wheezing today, continue bronchodilator, added steroids. Bronchospasm is better.  Completed additional 2 doses of prednisone.   Acute kidney injury secondary to septic shock and dehydration. Hypernatremia secondary to dehydration. Severe hypokalemia. Hypophosphatemia. Hypomagnesemia. Normal anion gap metabolic acidosis. Patient had worsening hypernatremia and hypokalemia today.  This is due to poor p.o. intake and dehydration.  Renal function has improved, sodium level 1 has normalized.  Patient still has lower potassium, phosphate.  Replete through IV before discharge. Continue oral KCl at home dose.   Severe dysphagia likely due to esophagitis. Patient has significant lower chest pain with swallowing, has not been able to eat for about a week.  Patient most likely has a severe esophagitis, will start IV Protonix, oral sucralfate.  There is a concern for fungal esophagitis,  given nystatin.  Barium esophagus showed severe esophageal dysmotility.  Patient condition is improving after treatment.  Patient is able to tolerate diet without significant pain.  Will continue current treatment at discharge.   Elevated troponin secondary to septic shock. Seen by cardiology, no additional workup.   Thrombocytopenia secondary to septic shock. Anemia of chronic disease. Stable.  Hypoglycemia secondary to poor p.o. intake. Failure to thrive. Severe protein  calorie  malnutrition. Patient swallow much better, but appetite still poor.  Added Marinol, appetite also improving.  Continue Marinol.   Dementia History of CVA. Continue to follow.   Right upper lobe mass, most likely lung cancer.   Prognosis poor.   Patient long-term prognosis very poor, given multiple conditions, her life expectancy is between 6 months to a year.  Palliative care will see the patient as outpatient.  She also has high risk of aspiration pneumonia and terminal COPD with severe malnutrition.  As per discussion with daughter, patient can be transferred to hospice if condition deteriorates.       Consultants: Palliative Procedures performed: None  Disposition: Home health Diet recommendation:  Discharge Diet Orders (From admission, onward)     Start     Ordered   09/01/23 0000  Diet general       Comments: Dys 1 diet   09/01/23 1020           Dysphagia type 1 thin Liquid DISCHARGE MEDICATION: Allergies as of 09/01/2023       Reactions   Codeine    unknown   Beef-derived Drug Products Hives, Other (See Comments)   Swelling         Medication List     STOP taking these medications    pravastatin 80 MG tablet Commonly known as: PRAVACHOL       TAKE these medications    albuterol 108 (90 Base) MCG/ACT inhaler Commonly known as: VENTOLIN HFA Inhale 2 puffs into the lungs every 6 (six) hours as needed for wheezing or shortness of breath.   amiodarone 200 MG tablet Commonly known as: PACERONE Take 2 tablets (400 mg total) by mouth 2 (two) times daily for 4 days, THEN 1 tablet (200 mg total) daily. Start taking on: September 01, 2023   amoxicillin-clavulanate 400-57 MG/5ML suspension Commonly known as: AUGMENTIN Take 10 mLs (800 mg total) by mouth 2 (two) times daily for 3 days.   apixaban 2.5 MG Tabs tablet Commonly known as: ELIQUIS Take 1 tablet (2.5 mg total) by mouth 2 (two) times daily.   aspirin EC 81 MG tablet Take 81 mg by  mouth daily. AM   cyanocobalamin 1000 MCG/ML injection Commonly known as: VITAMIN B12 INJECT 1 ML AS DIRECTED MONTHLY   donepezil 5 MG tablet Commonly known as: ARICEPT Take 5 mg by mouth in the morning.   dronabinol 2.5 MG capsule Commonly known as: MARINOL Take 1 capsule (2.5 mg total) by mouth 2 (two) times daily before lunch and supper.   feeding supplement Liqd Take 237 mLs by mouth 2 (two) times daily between meals.   ferrous sulfate 325 (65 FE) MG tablet Take 1 tablet (325 mg total) by mouth daily with breakfast.   ipratropium-albuterol 0.5-2.5 (3) MG/3ML Soln Commonly known as: DUONEB Take 3 mLs by nebulization every 6 (six) hours as needed.   levothyroxine 75 MCG tablet Commonly known as: SYNTHROID Take 75 mcg by mouth daily before breakfast. Saturday-Sunday   levothyroxine 50 MCG tablet Commonly known as: SYNTHROID Take 50 mcg by mouth daily before breakfast. Monday- Friday Patient takes 50 mcg   nystatin 100000 UNIT/ML suspension Commonly known as: MYCOSTATIN Take 5 mLs (500,000 Units total) by mouth 4 (four) times daily for 5 days.   pantoprazole 40 MG tablet Commonly known as: Protonix Take 1 tablet (40 mg total) by mouth 2 (two) times daily.   potassium chloride 10 MEQ tablet Commonly known as: KLOR-CON M Take 10 mEq by mouth 2 (  two) times daily.   predniSONE 20 MG tablet Commonly known as: DELTASONE Take 2 tablets (40 mg total) by mouth daily with breakfast for 2 days. Start taking on: September 02, 2023   sucralfate 1 GM/10ML suspension Commonly known as: CARAFATE Take 10 mLs (1 g total) by mouth 4 (four) times daily -  with meals and at bedtime.               Durable Medical Equipment  (From admission, onward)           Start     Ordered   08/29/23 1608  For home use only DME standard manual wheelchair with seat cushion  Once       Comments: Patient suffers from aspiration pneumonia and severe sepsis which impairs their ability to  perform daily activities like bathing and toileting in the home.  A walker will not resolve issue with performing activities of daily living. A wheelchair will allow patient to safely perform daily activities. Patient can safely propel the wheelchair in the home or has a caregiver who can provide assistance. Length of need Lifetime. Accessories: elevating leg rests (ELRs), wheel locks, extensions and anti-tippers.   08/29/23 1608   08/29/23 1605  For home use only DME Walker rolling  Once       Question Answer Comment  Walker: With 5 Inch Wheels   Patient needs a walker to treat with the following condition Aspiration pneumonia (HCC)      08/29/23 1605              Discharge Care Instructions  (From admission, onward)           Start     Ordered   09/01/23 0000  Discharge wound care:       Comments: Pressure Injury 08/30/23 Buttocks Left;Upper Stage 2 -  Partial thickness loss of dermis presenting as a shallow open injury with a red, pink wound bed without slough. 0.2*0.2 Keep pressure off. Follow with pcp   09/01/23 1020            Follow-up Information     Paraschos, Alexander, MD. Go in 1 week(s).   Specialty: Cardiology Contact information: [address] [facility] [place] [address] [phone]         Barbette Reichmann, MD Follow up in 1 week(s).   Specialty: Internal Medicine Contact information: [address] [place] [address] [phone]                Discharge Exam: Robin Graves Weights   08/29/23 0500 08/30/23 0457 09/01/23 0439  Weight: 40.3 kg 40.5 kg 38.4 kg   General exam: Appears calm and comfortable, cachectic. Respiratory system: Rhonchi in the base. Respiratory effort normal. Cardiovascular system: S1 & S2 heard, RRR. No JVD, murmurs, rubs, gallops or clicks. No pedal edema. Gastrointestinal system: Abdomen is nondistended, soft and nontender. No organomegaly or masses  felt. Normal bowel sounds heard. Central nervous system: Alert and oriented x3. No focal neurological deficits. Extremities: Severe muscle atrophy. Skin: No rashes, lesions or ulcers Psychiatry: Judgement and insight appear normal. Mood & affect appropriate.    Condition at discharge: poor  The results of significant diagnostics from this hospitalization (including imaging, microbiology, ancillary and laboratory) are listed below for reference.   Imaging Studies: DG ESOPHAGUS W SINGLE CM (SOL OR THIN BA) Result Date: 08/29/2023 CLINICAL DATA:  History of aspiration pneumonia and dysphagia EXAM: ESOPHAGUS/BARIUM SWALLOW/TABLET STUDY TECHNIQUE: Single contrast  examination was performed using thin liquid barium. This exam was performed by Alwyn Ren NP, and was supervised and interpreted by Dr. Fredia Sorrow. FLUOROSCOPY: Radiation Exposure Index (as provided by the fluoroscopic device): 8.60 mGy Kerma COMPARISON:  None Available. FINDINGS: Swallowing: Vestibular penetration observed without visualized overt aspiration below the vocal cords. Pharynx: Unremarkable. Esophagus: Normal appearance. Esophageal motility: Moderate to severe dysmotility with tertiary contractions delayed transit of barium through the esophagus. Hiatal Hernia: None. Gastroesophageal reflux: None visualized. Ingested 13mm barium tablet: Patient unable to swallow barium tablet. Other: None. IMPRESSION: Vestibular penetration without visualized overt aspiration. Moderate to severe dysmotility with tertiary contractions and delayed transit of barium through the esophagus. Limited study due to patient's clinical status. Electronically Signed   By: Irish Lack M.D.   On: 08/29/2023 13:08   ECHOCARDIOGRAM COMPLETE Result Date: 08/26/2023    ECHOCARDIOGRAM REPORT   Patient Name:   Robin Graves Date of Exam: 08/26/2023 Medical Rec #:  [id number]   Height:       60.0 in Accession #:    [id number]  Weight:       92.8 lb Date of Birth:   [birth year]   BSA:          1.347 m Patient Age:    82 years    BP:           95/57 mmHg Patient Gender: F           HR:           92 bpm. Exam Location:  [facility] Procedure: 2D Echo, Cardiac Doppler and Color Doppler (Both Spectral and Color            Flow Doppler were utilized during procedure). Indications:     Shock R57.9  History:         Patient has no prior history of Echocardiogram examinations.                  COPD and Stroke.  Sonographer:     Cristela Blue Referring Phys:  [id number] CARALYN HUDSON Diagnosing Phys: Windell Norfolk  Sonographer Comments: Technically challenging study due to limited acoustic windows. Image acquisition challenging due to COPD. IMPRESSIONS  1. Technically difficult study.  2. Left ventricular ejection fraction, by estimation, is 60 to 65%. The left ventricle has normal function. The left ventricle has no regional wall motion abnormalities. There is mild left ventricular hypertrophy. Left ventricular diastolic parameters are indeterminate.  3. Right ventricular systolic function is normal. The right ventricular size is normal.  4. Moderate mitral valve regurgitation.  5. The aortic valve was not well visualized. There is moderate calcification of the aortic valve. Aortic valve regurgitation is not visualized. Mild aortic valve stenosis. FINDINGS  Left Ventricle: Left ventricular ejection fraction, by estimation, is 60 to 65%. The left ventricle has normal function. The left ventricle has no regional wall motion abnormalities. Strain imaging was not performed. The left ventricular internal cavity  size was normal in size. There is mild left ventricular hypertrophy. Left ventricular diastolic parameters are indeterminate. Right Ventricle: The right ventricular size is normal. No increase in right ventricular wall thickness. Right ventricular systolic function is normal. Left Atrium: Left atrial size was normal in size. Right Atrium: Right atrial size was normal in size. Pericardium: There  is no evidence of pericardial effusion. Mitral Valve: There is mild thickening of the mitral valve leaflet(s). Mild mitral annular calcification. Moderate mitral valve regurgitation. MV peak gradient, 4.2 mmHg. The mean mitral valve  gradient is 3.0 mmHg. Tricuspid Valve: The tricuspid valve is not well visualized. Tricuspid valve regurgitation is mild. Aortic Valve: Peak velocity 2.4 m/s, Mean gradient 13 mmHg, Dimensionless index 0.50. The aortic valve was not well visualized. There is moderate calcification of the aortic valve. Aortic valve regurgitation is not visualized. Mild aortic stenosis is present. Aortic valve mean gradient measures 9.2 mmHg. Aortic valve peak gradient measures 16.6 mmHg. Aortic valve area, by VTI measures 1.90 cm. Pulmonic Valve: The pulmonic valve was not well visualized. Pulmonic valve regurgitation is trivial. Aorta: The aortic root is normal in size and structure. Venous: The inferior vena cava was not well visualized. IAS/Shunts: The interatrial septum was not well visualized. Additional Comments: 3D imaging was not performed.  LEFT VENTRICLE PLAX 2D LVIDd:         2.20 cm   Diastology LVIDs:         1.50 cm   LV e' medial:    7.62 cm/s LV PW:         0.90 cm   LV E/e' medial:  11.5 LV IVS:        1.10 cm   LV e' lateral:   11.40 cm/s LVOT diam:     2.00 cm   LV E/e' lateral: 7.7 LV SV:         68 LV SV Index:   50 LVOT Area:     3.14 cm  RIGHT VENTRICLE RV Basal diam:  2.90 cm RV Mid diam:    3.20 cm RV S prime:     13.60 cm/s TAPSE (M-mode): 1.9 cm LEFT ATRIUM           Index        RIGHT ATRIUM          Index LA diam:      2.80 cm 2.08 cm/m   RA Area:     7.09 cm LA Vol (A2C): 9.1 ml  6.76 ml/m   RA Volume:   10.30 ml 7.65 ml/m LA Vol (A4C): 18.1 ml 13.44 ml/m  AORTIC VALVE AV Area (Vmax):    1.80 cm AV Area (Vmean):   1.96 cm AV Area (VTI):     1.90 cm AV Vmax:           204.00 cm/s AV Vmean:          137.750 cm/s AV VTI:            0.358 m AV Peak Grad:      16.6 mmHg  AV Mean Grad:      9.2 mmHg LVOT Vmax:         117.00 cm/s LVOT Vmean:        86.100 cm/s LVOT VTI:          0.216 m LVOT/AV VTI ratio: 0.60  AORTA Ao Root diam: 2.90 cm MITRAL VALVE                  TRICUSPID VALVE MV Area (PHT): 3.76 cm       TR Peak grad:   20.8 mmHg MV Area VTI:   2.94 cm       TR Vmax:        228.00 cm/s MV Peak grad:  4.2 mmHg MV Mean grad:  3.0 mmHg       SHUNTS MV Vmax:       1.02 m/s       Systemic VTI:  0.22 m MV Vmean:  78.2 cm/s      Systemic Diam: 2.00 cm MV Decel Time: 202 msec MR Peak grad:    99.2 mmHg MR Mean grad:    51.0 mmHg MR Vmax:         498.00 cm/s MR Vmean:        326.0 cm/s MR PISA:         1.01 cm MR PISA Eff ROA: 6 mm MR PISA Radius:  0.40 cm MV E velocity: 88.00 cm/s MV A velocity: 100.00 cm/s MV E/A ratio:  0.88 Windell Norfolk Electronically signed by Windell Norfolk Signature Date/Time: 08/26/2023/5:38:03 PM    Final    DG Chest Port 1 View Result Date: 08/26/2023 CLINICAL DATA:  Atrial fibrillation.  Unresponsive. EXAM: PORTABLE CHEST 1 VIEW COMPARISON:  08/22/2023 FINDINGS: Marked thoracolumbar scoliosis. Cardiopericardial silhouette is at upper limits of normal for size. Diffuse interstitial and patchy/nodular bilateral airspace disease appears progressive in the interval, potentially secondary to edema given the relatively rapid progression. Diffuse infection not excluded. Nodular right parahilar density seen previously is again noted. Bones are diffusely demineralized. Telemetry leads overlie the chest. IMPRESSION: 1. Progressive diffuse interstitial and patchy/nodular bilateral airspace disease, potentially secondary to edema given the relatively rapid progression. Diffuse infection not excluded. Close follow-up recommended to ensure resolution. 2. Nodular right parahilar density seen previously is again noted. Electronically Signed   By: Kennith Center M.D.   On: 08/26/2023 08:11   DG Chest 2 View Result Date: 08/22/2023 CLINICAL DATA:  Increasing  pain in mouth, esophagus, and stomach for 1 week, dysphagia EXAM: CHEST - 2 VIEW COMPARISON:  08/14/2023, 08/13/2023 FINDINGS: Frontal and lateral views of the chest demonstrate a stable cardiac silhouette. The right upper lobe mass seen on prior CT is faintly apparent adjacent to the right hilum on the frontal view, concerning for malignancy based on previous CT imaging. The subcentimeter left upper lobe nodule seen on prior CT is not well visualized by x-ray. Stable asymmetric right apical pleural thickening, likely benign scarring given long-term stability. No acute airspace disease, effusion, or pneumothorax. Stable thoracic compression deformities with prior vertebral augmentations. IMPRESSION: 1. Stable right upper lobe perihilar mass concerning for malignancy based on previous CT findings. Subcentimeter left upper lobe nodule seen on prior CT is not visualized by x-ray. PET scan again recommended if not performed in the interim. 2. No acute airspace disease. Electronically Signed   By: Sharlet Salina M.D.   On: 08/22/2023 15:34   CT Angio Chest PE W and/or Wo Contrast Result Date: 08/14/2023 CLINICAL DATA:  Shortness of breath, abdominal pain, positive flu test on 4 days ago. Also, PA and lateral chest yesterday demonstrated suspected mass in the retrosternal clear space on the lateral view only. EXAM: CT ANGIOGRAPHY CHEST WITH CONTRAST TECHNIQUE: Multidetector CT imaging of the chest was performed using the standard protocol during bolus administration of intravenous contrast. Multiplanar CT image reconstructions and MIPs were obtained to evaluate the vascular anatomy. RADIATION DOSE REDUCTION: This exam was performed according to the departmental dose-optimization program which includes automated exposure control, adjustment of the mA and/or kV according to patient size and/or use of iterative reconstruction technique. CONTRAST:  75mL OMNIPAQUE IOHEXOL 350 MG/ML SOLN COMPARISON:  PA and lateral chest  yesterday, and chest, abdomen and pelvis CT with IV contrast 04/29/2020. FINDINGS: Cardiovascular: The cardiac size is normal. There is no pericardial effusion. Three-vessel coronary artery calcifications greatest in the LAD, additional scattered calcification in the left main coronary artery. There is moderate calcification  and thickening of the aortic valve leaflets. Consider echocardiographic evaluation. The aorta is normal in caliber with mild tortuosity and moderate calcific plaques, with atherosclerosis in the great vessels. Pulmonary arteries are normal in caliber without evidence of thromboemboli. The pulmonary veins are slightly distended but no more than previously. Mediastinum/Nodes: No enlarged mediastinal, hilar, or axillary lymph nodes. Thyroid gland, trachea, and esophagus demonstrate no significant findings. There is a small hiatal hernia. Lungs/Pleura: There is right-greater-than-left biapical pleural-parenchymal scarring, with right apical volume loss and upward hilar retraction and left apical linear calcifications within the scarring. On the right scarring changes merge with coarsely nodular chronic scar-like opacities. There are mild centrilobular emphysematous changes in the lungs. Anteriorly in the base of the right upper lobe there is new demonstration of a lobular solid nodule with pleural stranding measuring 2.1 x 2 x 1.8 cm (measured on 5:56 and 7:32). This is highly worrisome for a primary bronchogenic neoplasm. PET-CT or tissue sampling recommended. In the left upper lobe a new nodule is also noted on 5:34, with slight cavitation and measuring 8 x 6 mm. This could be a neoplasm or an inflammatory nodule. In the left lower lobe, there is a chronic stable 4 mm nodule on 5:97. There are scattered linear scar-like opacities in the bases. There is no consolidation, effusion or further nodules. Upper Abdomen: No acute abnormality. Status post cholecystectomy with chronically prominent common  bile duct. Abdominal aortic atherosclerosis. Musculoskeletal: There are multilevel thoracic spine chronic compression fractures, with old kyphoplasty at T9 and 12. Osteopenia and degenerative change with thoracic kyphosis. No new or progressive thoracic compression fractures. The ribcage is intact. No destructive bone lesion. Bilateral breast implants noted with chronic collapse of the left implant. Review of the MIP images confirms the above findings. IMPRESSION: 1. No evidence of arterial dilatation or thromboembolism. 2. 2.1 x 2 x 1.8 cm lobular solid nodule with pleural stranding in the base of the right upper lobe anteriorly, highly worrisome for a primary bronchogenic neoplasm. PET-CT or tissue sampling recommended. 3. Additional new 8 x 6 mm cavitary nodule in the left upper lobe, could be a neoplasm or an inflammatory nodule. 4. Emphysema. 5. Aortic and coronary artery atherosclerosis. 6. Moderate calcification and thickening of the aortic valve leaflets. Consider echocardiographic evaluation. 7. Small hiatal hernia. 8. Osteopenia and degenerative change with multilevel thoracic spine chronic compression fractures, with old kyphoplasty at T9 and 12. 9. 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 Aortic Atherosclerosis (ICD10-I70.0) and Emphysema (ICD10-J43.9). Electronically Signed   By: Almira Bar M.D.   On: 08/14/2023 01:21   DG Chest 2 View Result Date: 08/13/2023 CLINICAL DATA:  Shortness of breath. EXAM: CHEST - 2 VIEW COMPARISON:  07/04/2013.  Chest CT dated 04/29/2020. FINDINGS: Enlarged cardiac silhouette. Hyperexpanded lungs. Interval nodular density overlying the anterior upper thorax on the lateral view, not seen on the frontal view. No significant change in right greater than left biapical pleural and parenchymal scarring. Multiple thoracic and lumbar compression deformities are again demonstrated with kyphoplasty at 2 levels. Diffuse osteopenia. Mild-to-moderate dextroconvex thoracolumbar  scoliosis. Cholecystectomy clips. Atheromatous aortic calcifications. IMPRESSION: 1. Interval nodular density overlying the anterior upper thorax on the lateral view, not seen on the frontal view. Recommend chest CT for further evaluation. 2. COPD. 3. Cardiomegaly. Electronically Signed   By: Beckie Salts M.D.   On: 08/13/2023 19:39    Microbiology: Results for orders placed or performed during the hospital encounter of 08/26/23  Culture, blood (routine x 2)  Status: None   Collection Time: 08/26/23  7:04 AM   Specimen: BLOOD  Result Value Ref Range Status   Specimen Description BLOOD BLOOD RIGHT ARM  Final   Special Requests   Final    BOTTLES DRAWN AEROBIC AND ANAEROBIC Blood Culture results may not be optimal due to an inadequate volume of blood received in culture bottles   Culture   Final    NO GROWTH 5 DAYS Performed at [facility], [address] [place], [place] [id number]    Report Status 08/31/2023 FINAL  Final  Culture, blood (routine x 2)     Status: None   Collection Time: 08/26/23  7:04 AM   Specimen: BLOOD  Result Value Ref Range Status   Specimen Description BLOOD BLOOD LEFT ARM  Final   Special Requests   Final    BOTTLES DRAWN AEROBIC ONLY Blood Culture results may not be optimal due to an inadequate volume of blood received in culture bottles   Culture   Final    NO GROWTH 5 DAYS Performed at [facility], [address] [place], [place] [address]    Report Status 08/31/2023 FINAL  Final  Resp panel by RT-PCR (RSV, Flu A&B, Covid) Anterior Nasal Swab     Status: None   Collection Time: 08/26/23  8:32 AM   Specimen: Anterior Nasal Swab  Result Value Ref Range Status   SARS Coronavirus 2 by RT PCR NEGATIVE NEGATIVE Final    Comment: (NOTE) SARS-CoV-2 target nucleic acids are NOT DETECTED.  The SARS-CoV-2 RNA is generally detectable in upper respiratory specimens during the acute phase of infection. The lowest concentration of SARS-CoV-2  viral copies this assay can detect is 138 copies/mL. A negative result does not preclude SARS-Cov-2 infection and should not be used as the sole basis for treatment or other patient management decisions. A negative result may occur with  improper specimen collection/handling, submission of specimen other than nasopharyngeal swab, presence of viral mutation(s) within the areas targeted by this assay, and inadequate number of viral copies(<138 copies/mL). A negative result must be combined with clinical observations, patient history, and epidemiological information. The expected result is Negative.  Fact Sheet for Patients:  BloggerCourse.com  Fact Sheet for Healthcare Providers:  SeriousBroker.it  This test is no t yet approved or cleared by the [place] FDA and  has been authorized for detection and/or diagnosis of SARS-CoV-2 by FDA under an Emergency Use Authorization (EUA). This EUA will remain  in effect (meaning this test can be used) for the duration of the COVID-19 declaration under Section 564(b)(1) of the Act, 21 U.S.C.section 360bbb-3(b)(1), unless the authorization is terminated  or revoked sooner.       Influenza A by PCR NEGATIVE NEGATIVE Final   Influenza B by PCR NEGATIVE NEGATIVE Final    Comment: (NOTE) The Xpert Xpress SARS-CoV-2/FLU/RSV plus assay is intended as an aid in the diagnosis of influenza from Nasopharyngeal swab specimens and should not be used as a sole basis for treatment. Nasal washings and aspirates are unacceptable for Xpert Xpress SARS-CoV-2/FLU/RSV testing.  Fact Sheet for Patients: BloggerCourse.com  Fact Sheet for Healthcare Providers: SeriousBroker.it  This test is not yet approved or cleared by the [place] FDA and has been authorized for detection and/or diagnosis of SARS-CoV-2 by FDA under an Emergency Use Authorization  (EUA). This EUA will remain in effect (meaning this test can be used) for the duration of the COVID-19 declaration under Section 564(b)(1) of the  Act, 21 U.S.C. section 360bbb-3(b)(1), unless the authorization is terminated or revoked.     Resp Syncytial Virus by PCR NEGATIVE NEGATIVE Final    Comment: (NOTE) Fact Sheet for Patients: BloggerCourse.com  Fact Sheet for Healthcare Providers: SeriousBroker.it  This test is not yet approved or cleared by the [place] FDA and has been authorized for detection and/or diagnosis of SARS-CoV-2 by FDA under an Emergency Use Authorization (EUA). This EUA will remain in effect (meaning this test can be used) for the duration of the COVID-19 declaration under Section 564(b)(1) of the Act, 21 U.S.C. section 360bbb-3(b)(1), unless the authorization is terminated or revoked.  Performed at [facility], [address] [place], [place] [address]   MRSA Next Gen by PCR, Nasal     Status: None   Collection Time: 08/26/23  4:10 PM   Specimen: Nasal Mucosa; Nasal Swab  Result Value Ref Range Status   MRSA by PCR Next Gen NOT DETECTED NOT DETECTED Final    Comment: (NOTE) The GeneXpert MRSA Assay (FDA approved for NASAL specimens only), is one component of a comprehensive MRSA colonization surveillance program. It is not intended to diagnose MRSA infection nor to guide or monitor treatment for MRSA infections. Test performance is not FDA approved in patients less than 50 years old. Performed at [facility], [address] [place], [place] [address]     Labs: CBC: Recent Labs  Lab 08/26/23 [phone] 08/27/23 0450 08/28/23 0645 08/30/23 8295 08/31/23 0527 09/01/23 0524  WBC 14.5* 9.3 10.9* 11.3* 10.6* 10.8*  NEUTROABS 13.2*  --   --   --   --   --   HGB 9.0* 9.6* 9.8* 9.3* 8.4* 8.1*  HCT 28.1* 30.6* 29.1* 27.0* 24.7* 25.2*  MCV 98.3 97.5 91.5 90.0 90.1 93.3  PLT  149* 137* 148* 124* 118* 124*   Basic Metabolic Panel: Recent Labs  Lab 08/28/23 0645 08/28/23 1614 08/29/23 0652 08/30/23 0632 08/31/23 0527 09/01/23 0524  NA 146*  --  145 148* 143 144  K 2.4* 5.1 4.2 2.9* 3.4* 3.2*  CL 118*  --  117* 115* 113* 112*  CO2 20*  --  23 26 25 25   GLUCOSE 99  --  105* 80 126* 78  BUN 22  --  13 11 13 19   CREATININE 0.76  --  0.71 0.72 0.71 0.77  CALCIUM 8.2*  --  8.3* 7.8* 8.1* 8.4*  MG 1.9  --  1.6* 2.1 2.0 2.1  PHOS 1.7*  --  1.5* 4.0 2.2* 2.2*   Liver Function Tests: Recent Labs  Lab 08/26/23 0704 08/27/23 0450 08/28/23 0645  AST 31  --   --   ALT 27  --   --   ALKPHOS 93  --   --   BILITOT 0.8  --   --   PROT 5.3*  --   --   ALBUMIN 1.8* 1.9* 1.7*   CBG: Recent Labs  Lab 08/26/23 1552  GLUCAP 94    Discharge time spent: greater than 30 minutes.  Signed: Marrion Coy, MD [facility] 09/01/2023

## 2023-09-02 ENCOUNTER — Other Ambulatory Visit: Payer: Medicare PPO

## 2023-09-03 ENCOUNTER — Ambulatory Visit
Admission: RE | Admit: 2023-09-03 | Discharge: 2023-09-03 | Disposition: A | Discharge status: Home / Self Care | Payer: Medicare PPO | Source: Ambulatory Visit | Attending: Specialist | Admitting: Specialist

## 2023-09-03 DIAGNOSIS — I959 Hypotension, unspecified: Secondary | ICD-10-CM | POA: Diagnosis not present

## 2023-09-03 DIAGNOSIS — J984 Other disorders of lung: Secondary | ICD-10-CM | POA: Insufficient documentation

## 2023-09-03 DIAGNOSIS — J432 Centrilobular emphysema: Secondary | ICD-10-CM | POA: Insufficient documentation

## 2023-09-03 DIAGNOSIS — R918 Other nonspecific abnormal finding of lung field: Secondary | ICD-10-CM | POA: Insufficient documentation

## 2023-09-03 DIAGNOSIS — E860 Dehydration: Secondary | ICD-10-CM | POA: Diagnosis not present

## 2023-09-03 LAB — GLUCOSE, CAPILLARY
Glucose-Capillary: 69 mg/dL — ABNORMAL LOW (ref 70–99)
Glucose-Capillary: 68 mg/dL — ABNORMAL LOW (ref 70–99)

## 2023-09-03 MED ORDER — FLUDEOXYGLUCOSE F - 18 (FDG) INJECTION
5.6900 | Freq: Once | INTRAVENOUS | Status: AC | PRN
  Administered 2023-09-03: 5.69 via INTRAVENOUS

## 2023-09-04 ENCOUNTER — Other Ambulatory Visit: Payer: Self-pay

## 2023-09-04 ENCOUNTER — Inpatient Hospital Stay
Admission: EM | Admit: 2023-09-04 | Discharge: 2023-09-11 | DRG: 314 | Disposition: A | Discharge status: Hospice | Payer: Medicare PPO | Attending: Student | Admitting: Student

## 2023-09-04 ENCOUNTER — Ambulatory Visit: Payer: No Typology Code available for payment source

## 2023-09-04 DIAGNOSIS — F03918 Unspecified dementia, unspecified severity, with other behavioral disturbance: Secondary | ICD-10-CM

## 2023-09-04 DIAGNOSIS — K209 Esophagitis, unspecified without bleeding: Secondary | ICD-10-CM | POA: Diagnosis present

## 2023-09-04 DIAGNOSIS — R531 Weakness: Principal | ICD-10-CM

## 2023-09-04 DIAGNOSIS — I5032 Chronic diastolic (congestive) heart failure: Secondary | ICD-10-CM

## 2023-09-04 DIAGNOSIS — Z8249 Family history of ischemic heart disease and other diseases of the circulatory system: Secondary | ICD-10-CM

## 2023-09-04 DIAGNOSIS — Z885 Allergy status to narcotic agent status: Secondary | ICD-10-CM

## 2023-09-04 DIAGNOSIS — Z681 Body mass index (BMI) 19 or less, adult: Secondary | ICD-10-CM

## 2023-09-04 DIAGNOSIS — Z515 Encounter for palliative care: Secondary | ICD-10-CM

## 2023-09-04 DIAGNOSIS — Z8673 Personal history of transient ischemic attack (TIA), and cerebral infarction without residual deficits: Secondary | ICD-10-CM

## 2023-09-04 DIAGNOSIS — E43 Unspecified severe protein-calorie malnutrition: Secondary | ICD-10-CM

## 2023-09-04 DIAGNOSIS — Z7951 Long term (current) use of inhaled steroids: Secondary | ICD-10-CM

## 2023-09-04 DIAGNOSIS — E890 Postprocedural hypothyroidism: Secondary | ICD-10-CM

## 2023-09-04 DIAGNOSIS — J9601 Acute respiratory failure with hypoxia: Secondary | ICD-10-CM | POA: Diagnosis not present

## 2023-09-04 DIAGNOSIS — Z87891 Personal history of nicotine dependence: Secondary | ICD-10-CM

## 2023-09-04 DIAGNOSIS — E860 Dehydration: Secondary | ICD-10-CM

## 2023-09-04 DIAGNOSIS — Z79890 Hormone replacement therapy (postmenopausal): Secondary | ICD-10-CM

## 2023-09-04 DIAGNOSIS — N179 Acute kidney failure, unspecified: Secondary | ICD-10-CM | POA: Diagnosis present

## 2023-09-04 DIAGNOSIS — Z961 Presence of intraocular lens: Secondary | ICD-10-CM | POA: Diagnosis present

## 2023-09-04 DIAGNOSIS — Z9842 Cataract extraction status, left eye: Secondary | ICD-10-CM

## 2023-09-04 DIAGNOSIS — Z1152 Encounter for screening for COVID-19: Secondary | ICD-10-CM

## 2023-09-04 DIAGNOSIS — Z9841 Cataract extraction status, right eye: Secondary | ICD-10-CM

## 2023-09-04 DIAGNOSIS — I959 Hypotension, unspecified: Principal | ICD-10-CM | POA: Diagnosis present

## 2023-09-04 DIAGNOSIS — Z66 Do not resuscitate: Secondary | ICD-10-CM | POA: Diagnosis present

## 2023-09-04 DIAGNOSIS — J690 Pneumonitis due to inhalation of food and vomit: Secondary | ICD-10-CM | POA: Diagnosis not present

## 2023-09-04 DIAGNOSIS — J449 Chronic obstructive pulmonary disease, unspecified: Secondary | ICD-10-CM

## 2023-09-04 DIAGNOSIS — Z90710 Acquired absence of both cervix and uterus: Secondary | ICD-10-CM

## 2023-09-04 DIAGNOSIS — I2510 Atherosclerotic heart disease of native coronary artery without angina pectoris: Secondary | ICD-10-CM | POA: Diagnosis present

## 2023-09-04 DIAGNOSIS — Z79899 Other long term (current) drug therapy: Secondary | ICD-10-CM

## 2023-09-04 DIAGNOSIS — L89322 Pressure ulcer of left buttock, stage 2: Secondary | ICD-10-CM | POA: Diagnosis present

## 2023-09-04 DIAGNOSIS — D649 Anemia, unspecified: Secondary | ICD-10-CM | POA: Diagnosis present

## 2023-09-04 DIAGNOSIS — K2100 Gastro-esophageal reflux disease with esophagitis, without bleeding: Secondary | ICD-10-CM | POA: Diagnosis present

## 2023-09-04 DIAGNOSIS — J440 Chronic obstructive pulmonary disease with acute lower respiratory infection: Secondary | ICD-10-CM | POA: Diagnosis present

## 2023-09-04 DIAGNOSIS — Z833 Family history of diabetes mellitus: Secondary | ICD-10-CM

## 2023-09-04 DIAGNOSIS — M549 Dorsalgia, unspecified: Secondary | ICD-10-CM | POA: Diagnosis present

## 2023-09-04 DIAGNOSIS — Z7901 Long term (current) use of anticoagulants: Secondary | ICD-10-CM

## 2023-09-04 DIAGNOSIS — J439 Emphysema, unspecified: Secondary | ICD-10-CM | POA: Diagnosis present

## 2023-09-04 DIAGNOSIS — I110 Hypertensive heart disease with heart failure: Secondary | ICD-10-CM | POA: Diagnosis present

## 2023-09-04 DIAGNOSIS — Z7982 Long term (current) use of aspirin: Secondary | ICD-10-CM

## 2023-09-04 DIAGNOSIS — Z9049 Acquired absence of other specified parts of digestive tract: Secondary | ICD-10-CM

## 2023-09-04 DIAGNOSIS — E785 Hyperlipidemia, unspecified: Secondary | ICD-10-CM | POA: Diagnosis present

## 2023-09-04 DIAGNOSIS — J441 Chronic obstructive pulmonary disease with (acute) exacerbation: Secondary | ICD-10-CM | POA: Diagnosis present

## 2023-09-04 DIAGNOSIS — J810 Acute pulmonary edema: Secondary | ICD-10-CM | POA: Diagnosis not present

## 2023-09-04 DIAGNOSIS — Z9102 Food additives allergy status: Secondary | ICD-10-CM

## 2023-09-04 DIAGNOSIS — Z9013 Acquired absence of bilateral breasts and nipples: Secondary | ICD-10-CM

## 2023-09-04 DIAGNOSIS — Z860100 Personal history of colon polyps, unspecified: Secondary | ICD-10-CM

## 2023-09-04 DIAGNOSIS — E039 Hypothyroidism, unspecified: Secondary | ICD-10-CM | POA: Diagnosis present

## 2023-09-04 DIAGNOSIS — I10 Essential (primary) hypertension: Secondary | ICD-10-CM

## 2023-09-04 DIAGNOSIS — I482 Chronic atrial fibrillation, unspecified: Secondary | ICD-10-CM | POA: Diagnosis not present

## 2023-09-04 LAB — CBC WITH DIFFERENTIAL/PLATELET
Abs Immature Granulocytes: 0.06 10*3/uL (ref 0.00–0.07)
Basophils Absolute: 0 10*3/uL (ref 0.0–0.1)
Basophils Relative: 0 %
Eosinophils Absolute: 0 10*3/uL (ref 0.0–0.5)
Eosinophils Relative: 0 %
HCT: 26.7 % — ABNORMAL LOW (ref 36.0–46.0)
Hemoglobin: 8.3 g/dL — ABNORMAL LOW (ref 12.0–15.0)
Immature Granulocytes: 1 %
Lymphocytes Relative: 5 %
Lymphs Abs: 0.4 10*3/uL — ABNORMAL LOW (ref 0.7–4.0)
MCH: 29.7 pg (ref 26.0–34.0)
MCHC: 31.1 g/dL (ref 30.0–36.0)
MCV: 95.7 fL (ref 80.0–100.0)
Monocytes Absolute: 0.4 10*3/uL (ref 0.1–1.0)
Monocytes Relative: 4 %
Neutro Abs: 8.4 10*3/uL — ABNORMAL HIGH (ref 1.7–7.7)
Neutrophils Relative %: 90 %
Platelets: 199 10*3/uL (ref 150–400)
RBC: 2.79 MIL/uL — ABNORMAL LOW (ref 3.87–5.11)
RDW: 14.5 % (ref 11.5–15.5)
WBC: 9.3 10*3/uL (ref 4.0–10.5)
nRBC: 0.4 % — ABNORMAL HIGH (ref 0.0–0.2)

## 2023-09-04 LAB — COMPREHENSIVE METABOLIC PANEL
ALT: 82 U/L — ABNORMAL HIGH (ref 0–44)
AST: 58 U/L — ABNORMAL HIGH (ref 15–41)
Albumin: 1.8 g/dL — ABNORMAL LOW (ref 3.5–5.0)
Alkaline Phosphatase: 164 U/L — ABNORMAL HIGH (ref 38–126)
Anion gap: 9 (ref 5–15)
BUN: 27 mg/dL — ABNORMAL HIGH (ref 8–23)
CO2: 26 mmol/L (ref 22–32)
Calcium: 8.9 mg/dL (ref 8.9–10.3)
Chloride: 112 mmol/L — ABNORMAL HIGH (ref 98–111)
Creatinine, Ser: 1.4 mg/dL — ABNORMAL HIGH (ref 0.44–1.00)
GFR, Estimated: 37 mL/min — ABNORMAL LOW (ref >60)
Glucose, Bld: 90 mg/dL (ref 70–99)
Potassium: 3.8 mmol/L (ref 3.5–5.1)
Sodium: 147 mmol/L — ABNORMAL HIGH (ref 135–145)
Total Bilirubin: 0.2 mg/dL (ref 0.0–1.2)
Total Protein: 6.8 g/dL (ref 6.5–8.1)

## 2023-09-04 LAB — BRAIN NATRIURETIC PEPTIDE: B Natriuretic Peptide: 328.5 pg/mL — ABNORMAL HIGH (ref 0.0–100.0)

## 2023-09-04 LAB — MAGNESIUM: Magnesium: 2 mg/dL (ref 1.7–2.4)

## 2023-09-04 MED ORDER — NYSTATIN 100000 UNIT/ML MT SUSP
5.0000 mL | Freq: Four times a day (QID) | OROMUCOSAL | Status: DC
  Administered 2023-09-04 – 2023-09-08 (×14): 500000 [IU] via ORAL
  Filled 2023-09-04 (×19): qty 5

## 2023-09-04 MED ORDER — METHOCARBAMOL 500 MG PO TABS
500.0000 mg | ORAL_TABLET | Freq: Three times a day (TID) | ORAL | Status: DC | PRN
Start: 2023-09-04 — End: 2023-09-11

## 2023-09-04 MED ORDER — PREDNISONE 20 MG PO TABS
40.0000 mg | ORAL_TABLET | Freq: Every day | ORAL | Status: DC
  Administered 2023-09-06: 40 mg via ORAL
  Filled 2023-09-04 (×2): qty 2

## 2023-09-04 MED ORDER — AMIODARONE HCL 200 MG PO TABS
400.0000 mg | ORAL_TABLET | Freq: Two times a day (BID) | ORAL | Status: AC
  Administered 2023-09-04: 400 mg via ORAL
  Filled 2023-09-04: qty 2

## 2023-09-04 MED ORDER — ASPIRIN 81 MG PO TBEC: 81.0000 mg | DELAYED_RELEASE_TABLET | Freq: Every day | ORAL | Status: DC

## 2023-09-04 MED ORDER — OXYCODONE-ACETAMINOPHEN 5-325 MG PO TABS: 1.0000 | ORAL_TABLET | ORAL | Status: DC | PRN

## 2023-09-04 MED ORDER — ENSURE ENLIVE PO LIQD
237.0000 mL | Freq: Two times a day (BID) | ORAL | Status: DC
  Administered 2023-09-07: 237 mL via ORAL

## 2023-09-04 MED ORDER — SODIUM CHLORIDE 0.9 % IV BOLUS
500.0000 mL | Freq: Once | INTRAVENOUS | Status: AC
  Administered 2023-09-04: 500 mL via INTRAVENOUS

## 2023-09-04 MED ORDER — ACETAMINOPHEN 325 MG PO TABS: 325.0000 mg | ORAL_TABLET | Freq: Four times a day (QID) | ORAL | Status: DC | PRN

## 2023-09-04 MED ORDER — AMIODARONE HCL 200 MG PO TABS
200.0000 mg | ORAL_TABLET | Freq: Every day | ORAL | Status: DC
  Administered 2023-09-06 – 2023-09-08 (×3): 200 mg via ORAL
  Filled 2023-09-04 (×4): qty 1

## 2023-09-04 MED ORDER — DEXTROSE-SODIUM CHLORIDE 5-0.9 % IV SOLN: INTRAVENOUS | Status: DC

## 2023-09-04 MED ORDER — FERROUS SULFATE 325 (65 FE) MG PO TABS
325.0000 mg | ORAL_TABLET | Freq: Every day | ORAL | Status: DC
  Administered 2023-09-06 – 2023-09-08 (×3): 325 mg via ORAL
  Filled 2023-09-04 (×4): qty 1

## 2023-09-04 MED ORDER — ONDANSETRON HCL 4 MG/2ML IJ SOLN: 4.0000 mg | Freq: Three times a day (TID) | INTRAMUSCULAR | Status: DC | PRN

## 2023-09-04 MED ORDER — DRONABINOL 2.5 MG PO CAPS
2.5000 mg | ORAL_CAPSULE | Freq: Two times a day (BID) | ORAL | Status: DC
  Administered 2023-09-06 – 2023-09-08 (×5): 2.5 mg via ORAL
  Filled 2023-09-04 (×6): qty 1

## 2023-09-04 MED ORDER — DEXTROSE-SODIUM CHLORIDE 5-0.45 % IV SOLN: INTRAVENOUS | Status: DC

## 2023-09-04 MED ORDER — SUCRALFATE 1 GM/10ML PO SUSP
1.0000 g | Freq: Three times a day (TID) | ORAL | Status: DC
  Administered 2023-09-05 – 2023-09-08 (×11): 1 g via ORAL
  Filled 2023-09-04 (×13): qty 10

## 2023-09-04 MED ORDER — DIPHENHYDRAMINE HCL 50 MG/ML IJ SOLN: 12.5000 mg | Freq: Three times a day (TID) | INTRAMUSCULAR | Status: DC | PRN

## 2023-09-04 MED ORDER — DONEPEZIL HCL 5 MG PO TABS
5.0000 mg | ORAL_TABLET | Freq: Every morning | ORAL | Status: DC
  Administered 2023-09-05: 5 mg via ORAL
  Filled 2023-09-04: qty 1

## 2023-09-04 MED ORDER — ALBUTEROL SULFATE (2.5 MG/3ML) 0.083% IN NEBU: 3.0000 mL | INHALATION_SOLUTION | RESPIRATORY_TRACT | Status: DC | PRN

## 2023-09-04 MED ORDER — DM-GUAIFENESIN ER 30-600 MG PO TB12: 1.0000 | ORAL_TABLET | Freq: Two times a day (BID) | ORAL | Status: DC | PRN

## 2023-09-04 MED ORDER — AMOXICILLIN-POT CLAVULANATE 400-57 MG/5ML PO SUSR
800.0000 mg | Freq: Two times a day (BID) | ORAL | Status: AC
  Filled 2023-09-04: qty 10

## 2023-09-04 MED ORDER — LEVOTHYROXINE SODIUM 50 MCG PO TABS
50.0000 ug | ORAL_TABLET | Freq: Every day | ORAL | Status: DC
  Administered 2023-09-05 – 2023-09-08 (×3): 50 ug via ORAL
  Filled 2023-09-04 (×4): qty 1

## 2023-09-04 MED ORDER — APIXABAN 2.5 MG PO TABS
2.5000 mg | ORAL_TABLET | Freq: Two times a day (BID) | ORAL | Status: DC
  Filled 2023-09-04: qty 1

## 2023-09-04 MED ORDER — PANTOPRAZOLE SODIUM 40 MG PO TBEC
40.0000 mg | DELAYED_RELEASE_TABLET | Freq: Two times a day (BID) | ORAL | Status: DC
  Administered 2023-09-04: 40 mg via ORAL
  Filled 2023-09-04: qty 1

## 2023-09-04 NOTE — Progress Notes (Signed)
 [facility] Liaison Note  Received a referral from [facility], Charlynn Court, LCSW to evaluate patient for the Hospice Home.  Patient is not appropriate for the Hospice Home at this time.  Patient's daughter and hospital team aware.  Daughter may be interested in having Hospice services set up in her home located in [place].  TOC to reach out to her to discuss.    Please don't hesitate to call with any Hospice related questions or concerns.    Thank you for the opportunity to participate in this patient's care. [facility] Liaison [phone]

## 2023-09-04 NOTE — TOC Initial Note (Signed)
 Transition of Care [facility]) - Initial/Assessment Note    Patient Details  Name: Robin Graves MRN: [id number] Date of Birth: [birth year]  [facility] CM/SW Contact:    Margarito Liner, LCSW Phone Number: 09/04/2023, 4:31 PM  Clinical Narrative:  Patient does not qualify for the hospice home. Per [facility] liaison, daughter is interested in bringing her to her home in [place], [place] with hospice. CSW called daughter and confirmed. No agency preference. CSW called several agencies that do not serve [place]. Two of the agencies recommended [facility]. CSW made referral and faxed clinicals for review. Daughter said she would likely need a hospital bed and BSC. She already has a RW and wheelchair. Daughter's address is [address] [place], [place], [place] [address]. Daughter will transport at discharge.                Expected Discharge Plan: Home w Hospice Care Barriers to Discharge: Other (must enter comment) (Home hospice setup.)   Patient Goals and CMS Choice            Expected Discharge Plan and Services     Post Acute Care Choice: Hospice, Durable Medical Equipment Living arrangements for the past 2 months: Single Family Home                                      Prior Living Arrangements/Services Living arrangements for the past 2 months: Single Family Home Lives with:: Self Patient language and need for interpreter reviewed:: Yes Do you feel safe going back to the place where you live?: Yes      Need for Family Participation in Patient Care: Yes (Comment) Care giver support system in place?: Yes (comment) Current home services: DME Criminal Activity/Legal Involvement Pertinent to Current Situation/Hospitalization: No - Comment as needed  Activities of Daily Living      Permission Sought/Granted Permission sought to share information with : Family Supports    Share Information with NAME: Jerrol Banana  Permission granted to share info w  AGENCY: Home Hospice Agencies  Permission granted to share info w Relationship: Daughter  Permission granted to share info w Contact Information: [phone]  Emotional Assessment         Alcohol / Substance Use: Not Applicable Psych Involvement: No (comment)  Admission diagnosis:  sent by dr Kennith Maes dehydration Patient Active Problem List   Diagnosis Date Noted   Esophageal dysphagia 08/29/2023   Hypokalemia 08/28/2023   Hypophosphatemia 08/28/2023   Thrombocytopenia (HCC) 08/28/2023   Hypernatremia 08/28/2023   Septic shock (HCC) 08/28/2023   Aspiration pneumonia of both lower lobes due to gastric secretions (HCC) 08/28/2023   Diarrhea 08/23/2023   Esophagitis 08/22/2023   COPD (chronic obstructive pulmonary disease) (HCC) 08/22/2023   Dementia with behavioral disturbance (HCC) 08/22/2023   Protein-calorie malnutrition, severe (HCC) 08/22/2023   Leukocytosis 08/22/2023   Chronic diastolic CHF (congestive heart failure) (HCC) 08/14/2023   Pancytopenia (HCC) 08/14/2023   COPD with acute exacerbation (HCC) 08/14/2023   COPD with acute bronchitis (HCC) 08/14/2023   Influenza A 08/14/2023   Mass of upper lobe of right lung 08/14/2023   Tobacco use disorder 08/27/2016   S/P cardiac catheterization 03/01/2016   History of Graves' disease 02/22/2016   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), [place] - [address] [place]) [place] [address] Phone: [phone] Fax: [phone]     Social Drivers of Health  (SDOH) Social History: SDOH Screenings   Food Insecurity: Patient Unable To Answer (08/26/2023)  Housing: Patient Unable To Answer (08/26/2023)  Transportation Needs: Patient Unable To Answer (08/26/2023)  Utilities: Patient Unable To Answer (08/26/2023)  Financial Resource Strain: Low Risk  (08/21/2023)   Received from [facility]  Social Connections: Unknown (08/26/2023)  Tobacco Use: Medium Risk (09/04/2023)   SDOH Interventions:     Readmission Risk Interventions     No data to display

## 2023-09-04 NOTE — H&P (Signed)
 History and Physical    VEGAS COFFIN ZOX:[id number] DOB: 1941-05-14 DOA: 09/04/2023  Referring MD/NP/PA:   PCP: Barbette Reichmann, MD   Patient coming from:  The patient is coming from home.     Chief Complaint: weakness and hypotension  HPI: Robin Graves is a 83 y.o. female with medical history significant of  COPD, HLD, CAD, diastolic CHF, stroke, hypothyroidism, dementia, GERD, anemia, hypothyroidism (s/p of thyroidectomy due to Graves' disease), migraine headache, lung mass, recent esophagitis, aspiration pneumonia, recently diagnosed A-fib on Eliquis, who presents with weakness and hypotension.  Patient was recently hospitalized from 2/17 - 2/13 due to septic shock secondary to aspiration pneumonia.  Patient also has possible fungal esophagitis.  Patient is discharged on Augmentin and nystatin.  Per her daughter at the bedside, patient has been feeling weak.  It is generalized weakness.  No unilateral numbness or tingling in extremities.  Patient has poor appetite, decreased oral intake.  Patient was in PCPs office for follow-up and was found to have hypotension.  Her blood pressure is 83/56 which improved to 111/53 after giving 500 cc normal saline bolus in ED.  Patient has mild dry cough, and some chest discomfort, no SOB.  No fever or chills.  Patient does not have nausea, vomiting, diarrhea or abdominal pain.  No symptoms of UTI.  Patient complains of back pain. Note note, per EDP, pt was agreeable with hospice evaluation.  [name] with hospice team evaluated the patient and did not feel that pt currently met criteria for hospice house.    Data reviewed independently and ED Course: pt was found to have WBC 9.3, sodium 147, AKI with creatinine 1.40, BUN 27, GFR 37, (recent baseline creatinine 0.77 on 09/01/2023), temperature 97.5, heart rate 73, RR 27, oxygen saturation 91-98% on room air.  Patient is placed on telemetry bed for observation.   EKG: I have personally reviewed.  Sinus  rhythm, QTc 527, LAE, LVH, early R wave progression   Review of Systems:   General: no fevers, chills, no body weight gain, has poor appetite, has fatigue HEENT: no blurry vision, hearing changes or sore throat Respiratory: no dyspnea, has dry coughing, no wheezing CV: has chest discomfort, no palpitations GI: no nausea, vomiting, abdominal pain, diarrhea, constipation GU: no dysuria, burning on urination, increased urinary frequency, hematuria  Ext: no leg edema Neuro: no unilateral weakness, numbness, or tingling, no vision change or hearing loss.  Skin: no rash, no skin tear. MSK: No muscle spasm, no deformity, no limitation of range of movement in spin. Has back pain Heme: No easy bruising.  Travel history: No recent long distant travel.   Allergy:  Allergies  Allergen Reactions   Codeine     unknown   Beef-Derived Drug Products Hives and Other (See Comments)    Swelling     Past Medical History:  Diagnosis Date   Anemia    Arthritis    BACK   Chicken pox    Colon polyps    COPD (chronic obstructive pulmonary disease) (HCC)    Diverticulosis    Emphysema of lung (HCC)    GERD (gastroesophageal reflux disease)    Graves disease    Graves' disease with exophthalmos    REMOVED ON THYROID MEDS   Headache    MIGRAINES, 1 EVERY DAY OR LESS OFTEN   HH (hiatus hernia)    Hyperlipidemia    Pernicious anemia    Stroke [facility])    MINI STROKES 1995 A FEW BEFORE  1995   Wears dentures    UPPER AND LOWER    Past Surgical History:  Procedure Laterality Date   ABDOMINAL HYSTERECTOMY  1974   AUGMENTATION MAMMAPLASTY Bilateral 1976   silicone   AUGMENTATION MAMMAPLASTY Bilateral 1996   saline   BACK SURGERY  JUNE,4,2015   HURT AT WORK   BREAST SURGERY Bilateral , 1973   history of leakage   CATARACT EXTRACTION W/PHACO Right 04/20/2015   Procedure: CATARACT EXTRACTION PHACO AND INTRAOCULAR LENS PLACEMENT (IOC);  Surgeon: Lockie Mola, MD;  Location: [facility]  SURGERY CNTR;  Service: Ophthalmology;  Laterality: Right;   CATARACT EXTRACTION W/PHACO Left 01/23/2017   Procedure: CATARACT EXTRACTION PHACO AND INTRAOCULAR LENS PLACEMENT (IOC)  Left;  Surgeon: Lockie Mola, MD;  Location[facility];  Service: Ophthalmology;  Laterality: Left;   CHOLECYSTECTOMY     COLONOSCOPY WITH PROPOFOL N/A 05/16/2015   Procedure: COLONOSCOPY WITH PROPOFOL;  Surgeon: Elnita Maxwell, MD;  Location: [facility] ENDOSCOPY;  Service: Endoscopy;  Laterality: N/A;   ESOPHAGOGASTRODUODENOSCOPY (EGD) WITH PROPOFOL N/A 05/16/2015   Procedure: ESOPHAGOGASTRODUODENOSCOPY (EGD) WITH PROPOFOL;  Surgeon: Elnita Maxwell, MD;  Location: [facility] ENDOSCOPY;  Service: Endoscopy;  Laterality: N/A;   EYE SURGERY     KYPHOPLASTY  12/10/2013   T12   KYPHOPLASTY N/A 11/08/2016   Procedure: KYPHOPLASTY- T9;  Surgeon: Kennedy Bucker, MD;  Location: [facility] ORS;  Service: Orthopedics;  Laterality: N/A;   MASTECTOMY Bilateral 1970's   subcutaneous mastectomy done for preventative reasons with reconstruction   thyroid ablation      Social History:  reports that she quit smoking about 15 years ago. Her smoking use included cigarettes. She started smoking about 19 years ago. She has a 1 pack-year smoking history. She has never used smokeless tobacco. She reports that she does not drink alcohol and does not use drugs.  Family History:  Family History  Problem Relation Age of Onset   Coronary artery disease Mother    Stroke Father    Heart attack Father    Cancer Brother        colon   Stroke Brother    Heart attack Brother    Diabetes Brother      Prior to Admission medications   Medication Sig Start Date End Date Taking? Authorizing Provider  albuterol (VENTOLIN HFA) 108 (90 Base) MCG/ACT inhaler Inhale 2 puffs into the lungs every 6 (six) hours as needed for wheezing or shortness of breath. 08/15/23   Pennie Banter, DO  amiodarone (PACERONE) 200 MG tablet Take 2 tablets (400  mg total) by mouth 2 (two) times daily for 4 days, THEN 1 tablet (200 mg total) daily. 09/01/23 11/04/23  Marrion Coy, MD  amoxicillin-clavulanate (AUGMENTIN) 400-57 MG/5ML suspension Take 10 mLs (800 mg total) by mouth 2 (two) times daily for 3 days. 09/01/23 09/04/23  Marrion Coy, MD  apixaban (ELIQUIS) 2.5 MG TABS tablet Take 1 tablet (2.5 mg total) by mouth 2 (two) times daily. 09/01/23   Marrion Coy, MD  aspirin EC 81 MG tablet Take 81 mg by mouth daily. AM    [provider]  cyanocobalamin (,VITAMIN B-12,) 1000 MCG/ML injection INJECT 1 ML AS DIRECTED MONTHLY 10/03/15   Steele Sizer, MD  donepezil (ARICEPT) 5 MG tablet Take 5 mg by mouth in the morning. 07/16/23 07/15/24  [provider]  dronabinol (MARINOL) 2.5 MG capsule Take 1 capsule (2.5 mg total) by mouth 2 (two) times daily before lunch and supper. 09/01/23   Chipper Herb,  Dekui, MD  feeding supplement (ENSURE ENLIVE / ENSURE PLUS) LIQD Take 237 mLs by mouth 2 (two) times daily between meals. 08/24/23   Darlin Priestly, MD  ferrous sulfate 325 (65 FE) MG tablet Take 1 tablet (325 mg total) by mouth daily with breakfast. 06/30/15   Steele Sizer, MD  ipratropium-albuterol (DUONEB) 0.5-2.5 (3) MG/3ML SOLN Take 3 mLs by nebulization every 6 (six) hours as needed. 08/23/23   Darlin Priestly, MD  levothyroxine (SYNTHROID) 50 MCG tablet Take 50 mcg by mouth daily before breakfast. Monday- Friday Patient takes 50 mcg 07/12/23   [provider]  levothyroxine (SYNTHROID, LEVOTHROID) 75 MCG tablet Take 75 mcg by mouth daily before breakfast. Saturday-Sunday 10/08/16   [provider]  nystatin (MYCOSTATIN) 100000 UNIT/ML suspension Take 5 mLs (500,000 Units total) by mouth 4 (four) times daily for 5 days. 09/01/23 09/06/23  Marrion Coy, MD  pantoprazole (PROTONIX) 40 MG tablet Take 1 tablet (40 mg total) by mouth 2 (two) times daily. 08/23/23   Darlin Priestly, MD  potassium chloride (K-DUR,KLOR-CON) 10 MEQ tablet Take 10 mEq by mouth 2 (two)  times daily.    [provider]  predniSONE (DELTASONE) 20 MG tablet Take 2 tablets (40 mg total) by mouth daily with breakfast for 2 days. 09/02/23 09/04/23  Marrion Coy, MD  sucralfate (CARAFATE) 1 GM/10ML suspension Take 10 mLs (1 g total) by mouth 4 (four) times daily -  with meals and at bedtime. 09/01/23   Marrion Coy, MD    Physical Exam: Vitals:   09/04/23 1830 09/04/23 1900 09/04/23 1917 09/04/23 1930  BP: 119/64 (!) 114/55  127/70  Pulse: 74 76  95  Resp: 20 19  (!) 26  Temp:   97.9 F (36.6 C)   TempSrc:   Oral   SpO2: 94% 99%  94%  Weight:      Height:       General: Not in acute distress.  Dry mucous membrane.  Thin body habitus HEENT:       Eyes: PERRL, EOMI, no jaundice       ENT: No discharge from the ears and nose, no pharynx injection, no tonsillar enlargement.        Neck: No JVD, no bruit, no mass felt. Heme: No neck lymph node enlargement. Cardiac: S1/S2, RRR, No murmurs, No gallops or rubs. Respiratory: No rales, wheezing, rhonchi or rubs. GI: Soft, nondistended, nontender, no rebound pain, no organomegaly, BS present. GU: No hematuria Ext: No pitting leg edema bilaterally. 1+DP/PT pulse bilaterally. Musculoskeletal: No joint deformities, No joint redness or warmth, no limitation of ROM in spin. Skin: No rashes.  Neuro: Alert, oriented X3, cranial nerves II-XII grossly intact, moves all extremities normally. Psych: Patient is not psychotic, no suicidal or hemocidal ideation.  Labs on Admission: I have personally reviewed following labs and imaging studies  CBC: Recent Labs  Lab 08/30/23 0632 08/31/23 0527 09/01/23 0524 09/04/23 1252  WBC 11.3* 10.6* 10.8* 9.3  NEUTROABS  --   --   --  8.4*  HGB 9.3* 8.4* 8.1* 8.3*  HCT 27.0* 24.7* 25.2* 26.7*  MCV 90.0 90.1 93.3 95.7  PLT 124* 118* 124* 199   Basic Metabolic Panel: Recent Labs  Lab 08/29/23 0652 08/30/23 0632 08/31/23 0527 09/01/23 0524 09/04/23 1252  NA 145 148* 143 144 147*  K  4.2 2.9* 3.4* 3.2* 3.8  CL 117* 115* 113* 112* 112*  CO2 23 26 25 25 26   GLUCOSE 105* 80 126* 78 90  BUN 13 11 13 19  27*  CREATININE 0.71 0.72 0.71 0.77 1.40*  CALCIUM 8.3* 7.8* 8.1* 8.4* 8.9  MG 1.6* 2.1 2.0 2.1  --   PHOS 1.5* 4.0 2.2* 2.2*  --    GFR: Estimated Creatinine Clearance: 18.1 mL/min (A) (by C-G formula based on SCr of 1.4 mg/dL (H)). Liver Function Tests: Recent Labs  Lab 09/04/23 1252  AST 58*  ALT 82*  ALKPHOS 164*  BILITOT 0.2  PROT 6.8  ALBUMIN 1.8*   No results for input(s): "LIPASE", "AMYLASE" in the last 168 hours. No results for input(s): "AMMONIA" in the last 168 hours. Coagulation Profile: No results for input(s): "INR", "PROTIME" in the last 168 hours. Cardiac Enzymes: No results for input(s): "CKTOTAL", "CKMB", "CKMBINDEX", "TROPONINI" in the last 168 hours. BNP (last 3 results) No results for input(s): "PROBNP" in the last 8760 hours. HbA1C: No results for input(s): "HGBA1C" in the last 72 hours. CBG: Recent Labs  Lab 08/30/23 0807 09/03/23 1330  GLUCAP 69* 68*   Lipid Profile: No results for input(s): "CHOL", "HDL", "LDLCALC", "TRIG", "CHOLHDL", "LDLDIRECT" in the last 72 hours. Thyroid Function Tests: No results for input(s): "TSH", "T4TOTAL", "FREET4", "T3FREE", "THYROIDAB" in the last 72 hours. Anemia Panel: No results for input(s): "VITAMINB12", "FOLATE", "FERRITIN", "TIBC", "IRON", "RETICCTPCT" in the last 72 hours. Urine analysis:    Component Value Date/Time   COLORURINE YELLOW (A) 08/26/2023 1236   APPEARANCEUR HAZY (A) 08/26/2023 1236   APPEARANCEUR Clear 12/11/2012 0347   LABSPEC 1.026 08/26/2023 1236   LABSPEC 1.011 12/11/2012 0347   PHURINE 5.0 08/26/2023 1236   GLUCOSEU NEGATIVE 08/26/2023 1236   GLUCOSEU Negative 12/11/2012 0347   HGBUR MODERATE (A) 08/26/2023 1236   BILIRUBINUR NEGATIVE 08/26/2023 1236   BILIRUBINUR Negative 12/11/2012 0347   KETONESUR NEGATIVE 08/26/2023 1236   PROTEINUR 100 (A) 08/26/2023 1236    NITRITE NEGATIVE 08/26/2023 1236   LEUKOCYTESUR NEGATIVE 08/26/2023 1236   LEUKOCYTESUR 2+ 12/11/2012 0347   Sepsis Labs: @LABRCNTIP (procalcitonin:4,lacticidven:4) ) Recent Results (from the past 240 hours)  Culture, blood (routine x 2)     Status: None   Collection Time: 08/26/23  7:04 AM   Specimen: BLOOD  Result Value Ref Range Status   Specimen Description BLOOD BLOOD RIGHT ARM  Final   Special Requests   Final    BOTTLES DRAWN AEROBIC AND ANAEROBIC Blood Culture results may not be optimal due to an inadequate volume of blood received in culture bottles   Culture   Final    NO GROWTH 5 DAYS Performed at [facility], [address] [place], [place] [address]    Report Status 08/31/2023 FINAL  Final  Culture, blood (routine x 2)     Status: None   Collection Time: 08/26/23  7:04 AM   Specimen: BLOOD  Result Value Ref Range Status   Specimen Description BLOOD BLOOD LEFT ARM  Final   Special Requests   Final    BOTTLES DRAWN AEROBIC ONLY Blood Culture results may not be optimal due to an inadequate volume of blood received in culture bottles   Culture   Final    NO GROWTH 5 DAYS Performed at [facility], [address] [place], [place] [address]    Report Status 08/31/2023 FINAL  Final  Resp panel by RT-PCR (RSV, Flu A&B, Covid) Anterior Nasal Swab     Status: None   Collection Time: 08/26/23  8:32 AM   Specimen: Anterior Nasal Swab  Result Value Ref Range Status  SARS Coronavirus 2 by RT PCR NEGATIVE NEGATIVE Final    Comment: (NOTE) SARS-CoV-2 target nucleic acids are NOT DETECTED.  The SARS-CoV-2 RNA is generally detectable in upper respiratory specimens during the acute phase of infection. The lowest concentration of SARS-CoV-2 viral copies this assay can detect is 138 copies/mL. A negative result does not preclude SARS-Cov-2 infection and should not be used as the sole basis for treatment or other patient management decisions. A negative  result may occur with  improper specimen collection/handling, submission of specimen other than nasopharyngeal swab, presence of viral mutation(s) within the areas targeted by this assay, and inadequate number of viral copies(<138 copies/mL). A negative result must be combined with clinical observations, patient history, and epidemiological information. The expected result is Negative.  Fact Sheet for Patients:  BloggerCourse.com  Fact Sheet for Healthcare Providers:  SeriousBroker.it  This test is no t yet approved or cleared by the [place] FDA and  has been authorized for detection and/or diagnosis of SARS-CoV-2 by FDA under an Emergency Use Authorization (EUA). This EUA will remain  in effect (meaning this test can be used) for the duration of the COVID-19 declaration under Section 564(b)(1) of the Act, 21 U.S.C.section 360bbb-3(b)(1), unless the authorization is terminated  or revoked sooner.       Influenza A by PCR NEGATIVE NEGATIVE Final   Influenza B by PCR NEGATIVE NEGATIVE Final    Comment: (NOTE) The Xpert Xpress SARS-CoV-2/FLU/RSV plus assay is intended as an aid in the diagnosis of influenza from Nasopharyngeal swab specimens and should not be used as a sole basis for treatment. Nasal washings and aspirates are unacceptable for Xpert Xpress SARS-CoV-2/FLU/RSV testing.  Fact Sheet for Patients: BloggerCourse.com  Fact Sheet for Healthcare Providers: SeriousBroker.it  This test is not yet approved or cleared by the [place] FDA and has been authorized for detection and/or diagnosis of SARS-CoV-2 by FDA under an Emergency Use Authorization (EUA). This EUA will remain in effect (meaning this test can be used) for the duration of the COVID-19 declaration under Section 564(b)(1) of the Act, 21 U.S.C. section 360bbb-3(b)(1), unless the authorization is  terminated or revoked.     Resp Syncytial Virus by PCR NEGATIVE NEGATIVE Final    Comment: (NOTE) Fact Sheet for Patients: BloggerCourse.com  Fact Sheet for Healthcare Providers: SeriousBroker.it  This test is not yet approved or cleared by the [place] FDA and has been authorized for detection and/or diagnosis of SARS-CoV-2 by FDA under an Emergency Use Authorization (EUA). This EUA will remain in effect (meaning this test can be used) for the duration of the COVID-19 declaration under Section 564(b)(1) of the Act, 21 U.S.C. section 360bbb-3(b)(1), unless the authorization is terminated or revoked.  Performed at [facility], [address] [place], [place] [address]   MRSA Next Gen by PCR, Nasal     Status: None   Collection Time: 08/26/23  4:10 PM   Specimen: Nasal Mucosa; Nasal Swab  Result Value Ref Range Status   MRSA by PCR Next Gen NOT DETECTED NOT DETECTED Final    Comment: (NOTE) The GeneXpert MRSA Assay (FDA approved for NASAL specimens only), is one component of a comprehensive MRSA colonization surveillance program. It is not intended to diagnose MRSA infection nor to guide or monitor treatment for MRSA infections. Test performance is not FDA approved in patients less than 11 years old. Performed at [facility], [address], [place], [place] [address]      Radiological Exams  on Admission:   Assessment/Plan Principal Problem:   Dehydration Active Problems:   Hypotension   Esophagitis   Aspiration pneumonia (HCC)   Chronic diastolic CHF (congestive heart failure) (HCC)   Atrial fibrillation, chronic (HCC)   Hypertension   CAD (coronary artery disease)   COPD (chronic obstructive pulmonary disease) (HCC)   Hypothyroidism   Dementia with behavioral disturbance (HCC)   Protein-calorie malnutrition, severe (HCC)   Assessment and Plan:  Dehydration: Patient is clinically  dry on examination, clinically dehydrated.  Sodium 147. -Placed on telemetry bed for observation -IV fluid: 500 cc normal saline, then D5-half-normal saline at 75 cc/h -Encourage oral intake -Continue dronabinol  Hypotension: Blood pressure 83/56 which quickly improved to 08/09/2009/53 after giving 500 cc normal saline bolus.  Likely due to volume depletion and dehydration.  Clinically not septic.  No leukocytosis. -Continue IV fluid as above  Recent esophagitis: Possibly fungal esophagitis. -Continue nystatin until 09/06/2023  Recent aspiration pneumonia [facility]): No fever or leukocytosis. -let pt finish the last dose of Augmentin today -Continue prednisone 40 mg daily  Chronic diastolic CHF (congestive heart failure) (HCC): 2D echo on 08/26/2023 showed EF 60-65%.  Patient is clinically dry. -Watch volume status closely  Atrial fibrillation, chronic (HCC) -Continue amiodarone -Eliquis  Hypertension -Has hypotension now  CAD (coronary artery disease): -Pravastatin, aspirin  COPD (chronic obstructive pulmonary disease) (HCC) -Bronchodilators as needed Mucinex -Continue prednisone 40 mg daily  Hypothyroidism -Synthroid  Dementia with behavioral disturbance (HCC) -Fall precaution -Donepezil  Protein-calorie malnutrition, severe (HCC): Body weight 37.6 kg, BMI 17.96 -Ensure -Continue dronabinol       DVT ppx: on Eliquis  Code Status: DNR per pt and her daughter  Family Communication:  Yes, patient's daughter at bed side.     Disposition Plan:  Anticipate discharge back to previous environment  Consults called: None  Admission status and Level of care: Telemetry Medical:    for obs     Dispo: The patient is from: Home              Anticipated d/c is to: Home              Anticipated d/c date is: 1 day              Patient currently is not medically stable to d/c.    Severity of Illness:  The appropriate patient status for this patient is OBSERVATION.  Observation status is judged to be reasonable and necessary in order to provide the required intensity of service to ensure the patient's safety. The patient's presenting symptoms, physical exam findings, and initial radiographic and laboratory data in the context of their medical condition is felt to place them at decreased risk for further clinical deterioration. Furthermore, it is anticipated that the patient will be medically stable for discharge from the hospital within 2 midnights of admission.        Date of Service 09/04/2023    Lorretta Harp [facility]   If 7PM-7AM, please contact night-coverage [email or web address] 09/04/2023, 10:00 PM

## 2023-09-04 NOTE — ED Provider Notes (Signed)
 [facility] Provider Note    Event Date/Time   First MD Initiated Contact with Patient 09/04/23 1233     (approximate)   History   Follow-up   HPI  Robin Graves is a 83 year old female with history of COPD, CAD, CHF, CVA, dementia presenting to the emergency department for evaluation of weakness.  Patient accompanied by daughter who provides collateral history.  Recently discharged from the hospital, but has been generally weak and not eating at home.  They saw their primary care doctor in follow-up today he was concerned about patient's ability to safely stay at home and recommended ER presentation with evaluation for possible inpatient hospice.  I did confirm goals of care with daughter.  She reports that the patient is a DNR/DNI.  She is agreeable with IV fluids, limited interventions as appropriate, but is also comfortable with transition to comfort care/hospice in the setting of clinical decline.  I reviewed her discharge summary from 09/01/2023.  Patient recently admitted after presenting with A-fib and hypotension. Was found to be in septic shock secondary to aspiration pneumonia with COPD exacerbation.  Patient was noted to have poor long-term prognosis with plans for outpatient palliative care.       Physical Exam   Triage Vital Signs: ED Triage Vitals [09/04/23 1227]  Encounter Vitals Group     BP (!) 83/56     Systolic BP Percentile      Diastolic BP Percentile      Pulse Rate 94     Resp 17     Temp (!) 97.5 F (36.4 C)     Temp Source Oral     SpO2 91 %     Weight 83 lb (37.6 kg)     Height 4\' 9"  (1.448 m)     Head Circumference      Peak Flow      Pain Score 7     Pain Loc      Pain Education      Exclude from Growth Chart     Most recent vital signs: Vitals:   09/04/23 1516 09/04/23 1517  BP:    Pulse: 76 73  Resp: 20 20  Temp:    SpO2: 96% 98%     General: Awake, interactive  CV:  Regular rate, good peripheral  perfusion.  Resp:  Unlabored respirations, lungs clear to auscultation Abd:  Nondistended, soft, nontender Neuro:  Symmetric facial movement, fluid speech, generalized weakness   ED Results / Procedures / Treatments   Labs (all labs ordered are listed, but only abnormal results are displayed) Labs Reviewed  CBC WITH DIFFERENTIAL/PLATELET  COMPREHENSIVE METABOLIC PANEL     EKG EKG independently reviewed interpreted by myself (ER attending) demonstrates:    RADIOLOGY Imaging independently reviewed and interpreted by myself demonstrates:    PROCEDURES:  Critical Care performed: No  Procedures   MEDICATIONS ORDERED IN ED: Medications  sodium chloride 0.9 % bolus 500 mL (0 mLs Intravenous Stopped 09/04/23 1512)     IMPRESSION / MDM / ASSESSMENT AND PLAN / ED COURSE  I reviewed the triage vital signs and the nursing notes.  Differential diagnosis includes, but is not limited to, dehydration, ongoing weakness from recent acute illness, clinical history not suggestive of CHF or COPD exacerbation  Patient's presentation is most consistent with acute presentation with potential threat to life or bodily function.  83 year old female presenting to the ER for evaluation of weakness.  Hypotensive on presentation.  Extensive goals  of care discussion with family.  Daughter, [name] is healthcare power of attorney.  She would like to maximize comfort for the patient.  She was agreeable with hospice evaluation.  [name] with hospice team evaluated the patient and did not feel that she currently met criteria for hospice house.  Patient's primary care doctor was concerned about patient's ability be safely at home with her hypotension.  I did discuss limited medical interventions including IV fluids, labs, treatment for dehydration with admission with daughter.  She is agreeable with this plan.  Remains DNR/DNI.  Spoke with hospitalist team, but they did request that labs be performed and  reconsultation after these are done.  Signed out to oncoming physician pending lab work and anticipated admission.      FINAL CLINICAL IMPRESSION(S) / ED DIAGNOSES   Final diagnoses:  Generalized weakness     Rx / DC Orders   ED Discharge Orders     None        Note:  This document was prepared using Dragon voice recognition software and may include unintentional dictation errors.   Trinna Post, MD 09/04/23 [phone]

## 2023-09-04 NOTE — ED Triage Notes (Signed)
 Pt arrived via POV from PCP office. Per daughter pt went and seen Dr. Lajuana Carry office and he thinks that pt was d/c from [facility] to soon as pt is still weak and BP is low.

## 2023-09-05 DIAGNOSIS — J690 Pneumonitis due to inhalation of food and vomit: Secondary | ICD-10-CM | POA: Diagnosis present

## 2023-09-05 DIAGNOSIS — J440 Chronic obstructive pulmonary disease with acute lower respiratory infection: Secondary | ICD-10-CM | POA: Diagnosis present

## 2023-09-05 DIAGNOSIS — I5032 Chronic diastolic (congestive) heart failure: Secondary | ICD-10-CM | POA: Diagnosis present

## 2023-09-05 DIAGNOSIS — I482 Chronic atrial fibrillation, unspecified: Secondary | ICD-10-CM | POA: Diagnosis present

## 2023-09-05 DIAGNOSIS — N179 Acute kidney failure, unspecified: Secondary | ICD-10-CM | POA: Diagnosis present

## 2023-09-05 DIAGNOSIS — E860 Dehydration: Secondary | ICD-10-CM | POA: Diagnosis present

## 2023-09-05 DIAGNOSIS — Z66 Do not resuscitate: Secondary | ICD-10-CM | POA: Diagnosis present

## 2023-09-05 DIAGNOSIS — J9601 Acute respiratory failure with hypoxia: Secondary | ICD-10-CM | POA: Diagnosis not present

## 2023-09-05 DIAGNOSIS — I2510 Atherosclerotic heart disease of native coronary artery without angina pectoris: Secondary | ICD-10-CM | POA: Diagnosis present

## 2023-09-05 DIAGNOSIS — L89322 Pressure ulcer of left buttock, stage 2: Secondary | ICD-10-CM | POA: Diagnosis present

## 2023-09-05 DIAGNOSIS — Z681 Body mass index (BMI) 19 or less, adult: Secondary | ICD-10-CM | POA: Diagnosis not present

## 2023-09-05 DIAGNOSIS — E785 Hyperlipidemia, unspecified: Secondary | ICD-10-CM | POA: Diagnosis present

## 2023-09-05 DIAGNOSIS — F03918 Unspecified dementia, unspecified severity, with other behavioral disturbance: Secondary | ICD-10-CM | POA: Diagnosis present

## 2023-09-05 DIAGNOSIS — I959 Hypotension, unspecified: Secondary | ICD-10-CM | POA: Diagnosis present

## 2023-09-05 DIAGNOSIS — Z515 Encounter for palliative care: Secondary | ICD-10-CM | POA: Diagnosis not present

## 2023-09-05 DIAGNOSIS — Z1152 Encounter for screening for COVID-19: Secondary | ICD-10-CM | POA: Diagnosis not present

## 2023-09-05 DIAGNOSIS — E890 Postprocedural hypothyroidism: Secondary | ICD-10-CM | POA: Diagnosis present

## 2023-09-05 DIAGNOSIS — D649 Anemia, unspecified: Secondary | ICD-10-CM | POA: Diagnosis present

## 2023-09-05 DIAGNOSIS — E43 Unspecified severe protein-calorie malnutrition: Secondary | ICD-10-CM | POA: Diagnosis present

## 2023-09-05 DIAGNOSIS — J810 Acute pulmonary edema: Secondary | ICD-10-CM | POA: Diagnosis not present

## 2023-09-05 DIAGNOSIS — I110 Hypertensive heart disease with heart failure: Secondary | ICD-10-CM | POA: Diagnosis present

## 2023-09-05 DIAGNOSIS — J439 Emphysema, unspecified: Secondary | ICD-10-CM | POA: Diagnosis present

## 2023-09-05 DIAGNOSIS — J441 Chronic obstructive pulmonary disease with (acute) exacerbation: Secondary | ICD-10-CM | POA: Diagnosis present

## 2023-09-05 DIAGNOSIS — Z961 Presence of intraocular lens: Secondary | ICD-10-CM | POA: Diagnosis present

## 2023-09-05 LAB — COMPREHENSIVE METABOLIC PANEL
ALT: 56 U/L — ABNORMAL HIGH (ref 0–44)
AST: 34 U/L (ref 15–41)
Albumin: <1.5 g/dL — ABNORMAL LOW (ref 3.5–5.0)
Alkaline Phosphatase: 111 U/L (ref 38–126)
Anion gap: 7 (ref 5–15)
BUN: 27 mg/dL — ABNORMAL HIGH (ref 8–23)
CO2: 24 mmol/L (ref 22–32)
Calcium: 8.2 mg/dL — ABNORMAL LOW (ref 8.9–10.3)
Chloride: 114 mmol/L — ABNORMAL HIGH (ref 98–111)
Creatinine, Ser: 1.28 mg/dL — ABNORMAL HIGH (ref 0.44–1.00)
GFR, Estimated: 42 mL/min — ABNORMAL LOW (ref >60)
Glucose, Bld: 127 mg/dL — ABNORMAL HIGH (ref 70–99)
Potassium: 3.5 mmol/L (ref 3.5–5.1)
Sodium: 145 mmol/L (ref 135–145)
Total Bilirubin: 0.5 mg/dL (ref 0.0–1.2)
Total Protein: 5.4 g/dL — ABNORMAL LOW (ref 6.5–8.1)

## 2023-09-05 LAB — CBC
HCT: 21.3 % — ABNORMAL LOW (ref 36.0–46.0)
Hemoglobin: 6.7 g/dL — ABNORMAL LOW (ref 12.0–15.0)
MCH: 29.9 pg (ref 26.0–34.0)
MCHC: 31.5 g/dL (ref 30.0–36.0)
MCV: 95.1 fL (ref 80.0–100.0)
Platelets: 171 10*3/uL (ref 150–400)
RBC: 2.24 MIL/uL — ABNORMAL LOW (ref 3.87–5.11)
RDW: 14.4 % (ref 11.5–15.5)
WBC: 7.2 10*3/uL (ref 4.0–10.5)
nRBC: 0.3 % — ABNORMAL HIGH (ref 0.0–0.2)

## 2023-09-05 LAB — IRON AND TIBC
Iron: 14 ug/dL — ABNORMAL LOW (ref 28–170)
Saturation Ratios: 12 % (ref 10.4–31.8)
TIBC: 120 ug/dL — ABNORMAL LOW (ref 250–450)
UIBC: 106 ug/dL

## 2023-09-05 LAB — PHOSPHORUS: Phosphorus: 3.5 mg/dL (ref 2.5–4.6)

## 2023-09-05 LAB — ABO/RH: ABO/RH(D): B POS

## 2023-09-05 LAB — VITAMIN D 25 HYDROXY (VIT D DEFICIENCY, FRACTURES): Vit D, 25-Hydroxy: 46.6 ng/mL (ref 30–100)

## 2023-09-05 LAB — HEMOGLOBIN AND HEMATOCRIT, BLOOD
HCT: 20.2 % — ABNORMAL LOW (ref 36.0–46.0)
Hemoglobin: 6.4 g/dL — ABNORMAL LOW (ref 12.0–15.0)

## 2023-09-05 LAB — PREPARE RBC (CROSSMATCH)

## 2023-09-05 MED ORDER — GUAIFENESIN ER 600 MG PO TB12
600.0000 mg | ORAL_TABLET | Freq: Two times a day (BID) | ORAL | Status: DC
  Administered 2023-09-05 – 2023-09-08 (×6): 600 mg via ORAL
  Filled 2023-09-05 (×6): qty 1

## 2023-09-05 MED ORDER — FUROSEMIDE 10 MG/ML IJ SOLN
40.0000 mg | Freq: Once | INTRAMUSCULAR | Status: AC
  Administered 2023-09-05: 40 mg via INTRAVENOUS
  Filled 2023-09-05: qty 4

## 2023-09-05 MED ORDER — IPRATROPIUM-ALBUTEROL 0.5-2.5 (3) MG/3ML IN SOLN
3.0000 mL | Freq: Four times a day (QID) | RESPIRATORY_TRACT | Status: DC
  Administered 2023-09-05 – 2023-09-06 (×5): 3 mL via RESPIRATORY_TRACT
  Filled 2023-09-05 (×6): qty 3

## 2023-09-05 MED ORDER — SODIUM CHLORIDE 0.9% IV SOLUTION: Freq: Once | INTRAVENOUS | Status: AC

## 2023-09-05 MED ORDER — PANTOPRAZOLE SODIUM 40 MG IV SOLR
40.0000 mg | Freq: Two times a day (BID) | INTRAVENOUS | Status: DC
  Administered 2023-09-05 – 2023-09-08 (×7): 40 mg via INTRAVENOUS
  Filled 2023-09-05 (×7): qty 10

## 2023-09-05 NOTE — Progress Notes (Signed)
  [facility] Liaison Note      Daughter requested that patient be re-evaluated for the Hospice Home.  Will re-evaluate in the next day or so to see how she is doing after the blood transfusion.  Hospital team and daughter aware.    Please call with any Hospice related questions or concerns.  Thank you for the opportunity to participate in this patient's care  [facility] Liaison [phone]

## 2023-09-05 NOTE — Plan of Care (Signed)
  Problem: Education: Goal: Knowledge of General Education information will improve Description: Including pain rating scale, medication(s)/side effects and non-pharmacologic comfort measures Outcome: Progressing   Problem: Health Behavior/Discharge Planning: Goal: Ability to manage health-related needs will improve Outcome: Progressing   Problem: Clinical Measurements: Goal: Ability to maintain clinical measurements within normal limits will improve Outcome: Progressing Goal: Will remain free from infection Outcome: Progressing Goal: Diagnostic test results will improve Outcome: Progressing Goal: Respiratory complications will improve Outcome: Progressing Goal: Cardiovascular complication will be avoided Outcome: Progressing   Problem: Nutrition: Goal: Adequate nutrition will be maintained Outcome: Progressing   Problem: Coping: Goal: Level of anxiety will decrease Outcome: Progressing   Problem: Elimination: Goal: Will not experience complications related to bowel motility Outcome: Progressing Goal: Will not experience complications related to urinary retention Outcome: Progressing   Problem: Safety: Goal: Ability to remain free from injury will improve Outcome: Progressing   

## 2023-09-05 NOTE — Plan of Care (Signed)

## 2023-09-05 NOTE — Progress Notes (Signed)
 [facility] Progress Note  Patient: Robin Graves    ZOX:[id number]  DOA: 09/04/2023     Date of Service: the patient was seen and examined on 09/05/2023  Chief Complaint  Patient presents with   Follow-up   Brief hospital course: Robin Graves is a 83 y.o. female with medical history significant of  COPD, HLD, CAD, diastolic CHF, stroke, hypothyroidism, dementia, GERD, anemia, hypothyroidism (s/p of thyroidectomy due to Graves' disease), migraine headache, lung mass, recent esophagitis, aspiration pneumonia, recently diagnosed A-fib on Eliquis, who presents with weakness and hypotension.   Patient was recently hospitalized from 2/17 - 2/13 due to septic shock secondary to aspiration pneumonia.  Patient also has possible fungal esophagitis.  Patient is discharged on Augmentin and nystatin.  Per her daughter at the bedside, patient has been feeling weak.  It is generalized weakness.  No unilateral numbness or tingling in extremities.  Patient has poor appetite, decreased oral intake.  Patient was in PCPs office for follow-up and was found to have hypotension.  Her blood pressure is 83/56 which improved to 111/53 after giving 500 cc normal saline bolus in ED.   Patient has mild dry cough, and some chest discomfort, no SOB.  No fever or chills.  Patient does not have nausea, vomiting, diarrhea or abdominal pain.  No symptoms of UTI.  Patient complains of back pain. Note note, per EDP, pt was agreeable with hospice evaluation.  [name] with hospice team evaluated the patient and did not feel that pt currently met criteria for hospice house.      Data reviewed independently and ED Course: pt was found to have WBC 9.3, sodium 147, AKI with creatinine 1.40, BUN 27, GFR 37, (recent baseline creatinine 0.77 on 09/01/2023), temperature 97.5, heart rate 73, RR 27, oxygen saturation 91-98% on room air.  Patient is placed on telemetry bed for observation.   EKG: I have personally reviewed.  Sinus rhythm,  QTc 527, LAE, LVH, early R wave progression  2/27 discussed with patient's daughter at bedside, she would not continue blood draws, okay for 1 unit of PRBC transfusion, no more checking hemoglobin.  Continue only necessary treatment, no extra measures.  Keep her comfortable and hospice medical facility placement.  Assessment and Plan:  Acute hypoxic tori failure secondary to pulmonary edema most likely due to fluid resuscitation secondary to hypotension and dehydration IV fluid discontinued Continue supplemental O2 inhalation and gradually wean off Lasix 40 mg one-time dose given  Symptomatic anemia, unknown cause, no obvious bleeding Discontinued Eliquis and aspirin 1 unit PRBC infusion given on 2/27 No more blood draws, no more transfusion.   Dehydration and hypotension, resolved after IV fluid resuscitation Continue to monitor vital signs and hydration status  Recent esophagitis: Possibly fungal esophagitis. -Continue nystatin until 09/06/2023   Recent aspiration pneumonia [facility]): No fever or leukocytosis. -let pt finish the last dose of Augmentin today -Continue prednisone 40 mg daily   Chronic diastolic CHF (congestive heart failure) (HCC): 2D echo on 08/26/2023 showed EF 60-65%.  Patient is clinically dry. -Watch volume status closely   Atrial fibrillation, chronic (HCC) -Continue amiodarone -DC'd Eliquis due to low Hb   Hypertension -Presented with low blood pressure Monitor BP off antihypertensive medications for now    CAD (coronary artery disease): -d/c'd Pravastatin due to comfort care, DC'd aspirin due to Hb   COPD (chronic obstructive pulmonary disease) (HCC) -Bronchodilators as needed Mucinex -Continue prednisone 40 mg daily Continue DuoNebs every 6 hourly scheduled, transition to  as needed after improvement Started Mucinex 600 mg p.o. twice daily   Hypothyroidism -Synthroid   Dementia with behavioral disturbance (HCC) -Fall precaution -DC'd donepezil  due to comfort measures   Protein-calorie malnutrition, severe (HCC): Body weight 37.6 kg, BMI 17.96 -Ensure -Continue dronabinol   Body mass index is 18.86 kg/m.  Interventions:  Pressure Injury 08/30/23 Buttocks Left;Upper Stage 2 -  Partial thickness loss of dermis presenting as a shallow open injury with a red, pink wound bed without slough. 0.2*0.2 (Active)  08/30/23 1730  Location: Buttocks  Location Orientation: Left;Upper  Staging: Stage 2 -  Partial thickness loss of dermis presenting as a shallow open injury with a red, pink wound bed without slough.  Wound Description (Comments): 0.2*0.2  Present on Admission: No  Dressing Type Foam - Lift dressing to assess site every shift 09/01/23 0730     Diet: Dysphagia 2 diet DVT Prophylaxis: SCDs due to low hemoglobin  Advance goals of care discussion: DNR/DNI-limited  Family Communication: family was present at bedside, at the time of interview.  The pt provided permission to discuss medical plan with the family. Opportunity was given to ask question and all questions were answered satisfactorily.   Disposition:  Pt is from home, admitted with hypotensive, low hemoglobin, transition to comfort care, awaiting for hospice inpatient facility placement, which precludes a safe discharge. Discharge to hospice, when bed will be available.  Subjective: No significant events overnight, patient was very short of breath on supplemental O2 relation.  Denied any specific complaints, no bleeding.  Denied any chest pain or palpitations, no abdominal pain. Patient's daughter was at bedside, goals of care discussed.  She was okay for 1 unit of PRBC transfusion, no more blood draws.  Only continue necessary medications, no heroic measures.  She would like her to go to hospice inpatient facility.  Physical Exam: General: Mild to moderate respiratory distress affect anxious  Eyes: PERRLA ENT: Oral Mucosa Clear, moist  Neck: no JVD,   Cardiovascular: S1 and S2 Present, no Murmur,  Respiratory: Equal air entry bilaterally, bilateral crackles and minimal wheezes Abdomen: Bowel Sound present, Soft and no tenderness,  Skin: no rashes Extremities: no Pedal edema, no calf tenderness Neurologic: without any new focal findings Gait not checked due to patient safety concerns  Vitals:   09/05/23 1023 09/05/23 1045 09/05/23 1347 09/05/23 1432  BP: (!) 107/56 (!) 111/59 118/63   Pulse: 84 75 80   Resp:  (!) 22    Temp: 97.6 F (36.4 C) 98.4 F (36.9 C) 98.4 F (36.9 C)   TempSrc: Oral Oral Oral   SpO2: 97% 98%  95%  Weight:      Height:        Intake/Output Summary (Last 24 hours) at 09/05/2023 1444 Last data filed at 09/05/2023 1347 Gross per 24 hour  Intake 422.33 ml  Output --  Net 422.33 ml   Filed Weights   09/04/23 1227 09/05/23 0500  Weight: 37.6 kg 43.8 kg    Data Reviewed: I have personally reviewed and interpreted daily labs, tele strips, imagings as discussed above. I reviewed all nursing notes, pharmacy notes, vitals, pertinent old records I have discussed plan of care as described above with RN and patient/family.  CBC: Recent Labs  Lab 08/30/23 0632 08/31/23 0527 09/01/23 0524 09/04/23 1252 09/05/23 0414 09/05/23 0813  WBC 11.3* 10.6* 10.8* 9.3 7.2  --   NEUTROABS  --   --   --  8.4*  --   --  HGB 9.3* 8.4* 8.1* 8.3* 6.7* 6.4*  HCT 27.0* 24.7* 25.2* 26.7* 21.3* 20.2*  MCV 90.0 90.1 93.3 95.7 95.1  --   PLT 124* 118* 124* 199 171  --    Basic Metabolic Panel: Recent Labs  Lab 08/30/23 0632 08/31/23 0527 09/01/23 0524 09/04/23 1252 09/04/23 1542 09/05/23 0414  NA 148* 143 144 147*  --  145  K 2.9* 3.4* 3.2* 3.8  --  3.5  CL 115* 113* 112* 112*  --  114*  CO2 26 25 25 26   --  24  GLUCOSE 80 126* 78 90  --  127*  BUN 11 13 19  27*  --  27*  CREATININE 0.72 0.71 0.77 1.40*  --  1.28*  CALCIUM 7.8* 8.1* 8.4* 8.9  --  8.2*  MG 2.1 2.0 2.1  --  2.0  --   PHOS 4.0 2.2* 2.2*  --    --  3.5    Studies: No results found.  Scheduled Meds:  amiodarone  200 mg Oral Daily   dronabinol  2.5 mg Oral BID AC   feeding supplement  237 mL Oral BID BM   ferrous sulfate  325 mg Oral Q breakfast   guaiFENesin  600 mg Oral BID   ipratropium-albuterol  3 mL Nebulization Q6H   levothyroxine  50 mcg Oral QAC breakfast   nystatin  5 mL Oral QID   pantoprazole (PROTONIX) IV  40 mg Intravenous Q12H   predniSONE  40 mg Oral Q breakfast   sucralfate  1 g Oral TID WC & HS   Continuous Infusions: PRN Meds: acetaminophen, albuterol, diphenhydrAMINE, methocarbamol, oxyCODONE-acetaminophen  Time spent: 55 minutes  Author: Gillis Santa. MD [facility] 09/05/2023 2:44 PM  To reach On-call, see care teams to locate the attending and reach out to them via [email or web address]. If 7PM-7AM, please contact night-coverage If you still have difficulty reaching the attending provider, please page the [facility] (Director on Call) for [facility] on amion for assistance.

## 2023-09-06 DIAGNOSIS — E860 Dehydration: Secondary | ICD-10-CM | POA: Diagnosis not present

## 2023-09-06 LAB — TYPE AND SCREEN
ABO/RH(D): B POS
Antibody Screen: NEGATIVE
Unit division: 0

## 2023-09-06 LAB — BPAM RBC
Blood Product Expiration Date: 202503292359
ISSUE DATE / TIME: 202502271013
Unit Type and Rh: 7300

## 2023-09-06 MED ORDER — IPRATROPIUM-ALBUTEROL 0.5-2.5 (3) MG/3ML IN SOLN
3.0000 mL | Freq: Three times a day (TID) | RESPIRATORY_TRACT | Status: DC
  Administered 2023-09-07: 3 mL via RESPIRATORY_TRACT
  Filled 2023-09-06: qty 3

## 2023-09-06 MED ORDER — MIDODRINE HCL 5 MG PO TABS
10.0000 mg | ORAL_TABLET | Freq: Three times a day (TID) | ORAL | Status: DC
  Administered 2023-09-06 – 2023-09-08 (×6): 10 mg via ORAL
  Filled 2023-09-06 (×6): qty 2

## 2023-09-06 MED ORDER — PREDNISONE 20 MG PO TABS: 20.0000 mg | ORAL_TABLET | Freq: Every day | ORAL | Status: DC

## 2023-09-06 MED ORDER — PREDNISONE 10 MG PO TABS: 10.0000 mg | ORAL_TABLET | Freq: Every day | ORAL | Status: DC

## 2023-09-06 MED ORDER — FUROSEMIDE 10 MG/ML IJ SOLN
20.0000 mg | Freq: Two times a day (BID) | INTRAMUSCULAR | Status: AC
  Administered 2023-09-06: 20 mg via INTRAVENOUS
  Filled 2023-09-06 (×2): qty 2

## 2023-09-06 MED ORDER — MIDODRINE HCL 5 MG PO TABS
5.0000 mg | ORAL_TABLET | Freq: Once | ORAL | Status: AC
  Administered 2023-09-06: 5 mg via ORAL
  Filled 2023-09-06: qty 1

## 2023-09-06 MED ORDER — FUROSEMIDE 10 MG/ML IJ SOLN: 20.0000 mg | Freq: Two times a day (BID) | INTRAMUSCULAR | Status: DC

## 2023-09-06 MED ORDER — PREDNISONE 20 MG PO TABS
30.0000 mg | ORAL_TABLET | Freq: Every day | ORAL | Status: DC
  Administered 2023-09-07 – 2023-09-08 (×2): 30 mg via ORAL
  Filled 2023-09-06 (×2): qty 1

## 2023-09-06 NOTE — Plan of Care (Addendum)
 Patient is alert and oriented X 3. Pt blood pressure was 79/57 mm of hg , got midodrine as order. Rechecked blood pressure was 76/44 mm of hg. MD made aware.  Gave additional 5 mg of midodrine as order.  She is in oxygen 2 lit /min via nasal cannula.Plan of care ongoing.   Problem: Education: Goal: Knowledge of General Education information will improve Description: Including pain rating scale, medication(s)/side effects and non-pharmacologic comfort measures Outcome: Progressing   Problem: Health Behavior/Discharge Planning: Goal: Ability to manage health-related needs will improve Outcome: Progressing   Problem: Clinical Measurements: Goal: Ability to maintain clinical measurements within normal limits will improve Outcome: Progressing Goal: Will remain free from infection Outcome: Progressing Goal: Diagnostic test results will improve Outcome: Progressing Goal: Respiratory complications will improve Outcome: Progressing Goal: Cardiovascular complication will be avoided Outcome: Progressing   Problem: Activity: Goal: Risk for activity intolerance will decrease Outcome: Progressing   Problem: Coping: Goal: Level of anxiety will decrease Outcome: Progressing   Problem: Elimination: Goal: Will not experience complications related to bowel motility Outcome: Progressing Goal: Will not experience complications related to urinary retention Outcome: Progressing

## 2023-09-06 NOTE — Progress Notes (Signed)
 [facility] Liaison Note  Follow up on new referral for inpatient hospice who was found to not meet criteria for hospice Inpatient Unit [facility]).  Patient had blood transfusion yesterday and then made comfort care.    HLT to continue to follow to see how she progresses on comfort care.  Norris Cross, RN Nurse Liaison [phone]

## 2023-09-06 NOTE — Plan of Care (Signed)
  Problem: Education: Goal: Knowledge of General Education information will improve Description: Including pain rating scale, medication(s)/side effects and non-pharmacologic comfort measures Outcome: Progressing   Problem: Health Behavior/Discharge Planning: Goal: Ability to manage health-related needs will improve Outcome: Progressing   Problem: Clinical Measurements: Goal: Ability to maintain clinical measurements within normal limits will improve Outcome: Progressing Goal: Will remain free from infection Outcome: Progressing Goal: Diagnostic test results will improve Outcome: Progressing Goal: Respiratory complications will improve Outcome: Progressing Goal: Cardiovascular complication will be avoided Outcome: Progressing   Problem: Nutrition: Goal: Adequate nutrition will be maintained Outcome: Progressing   Problem: Coping: Goal: Level of anxiety will decrease Outcome: Progressing   Problem: Pain Managment: Goal: General experience of comfort will improve and/or be controlled Outcome: Progressing   Problem: Safety: Goal: Ability to remain free from injury will improve Outcome: Progressing

## 2023-09-06 NOTE — Progress Notes (Signed)
   09/06/23 1604  Assess: MEWS Score  Temp 97.9 F (36.6 C)  BP (!) 79/57  MAP (mmHg) 66  Pulse Rate 84  Resp 20  SpO2 95 %  O2 Device Nasal Cannula  O2 Flow Rate (L/min) 2 L/min  Assess: MEWS Score  MEWS Temp 0  MEWS Systolic 2  MEWS Pulse 0  MEWS RR 0  MEWS LOC 0  MEWS Score 2  MEWS Score Color Yellow  Assess: if the MEWS score is Yellow or Red  Were vital signs accurate and taken at a resting state? Yes  Does the patient meet 2 or more of the SIRS criteria? Yes  Does the patient have a confirmed or suspected source of infection? No  MEWS guidelines implemented  Yes, yellow  Treat  MEWS Interventions Considered administering scheduled or prn medications/treatments as ordered  Take Vital Signs  Increase Vital Sign Frequency  Yellow: Q2hr x1, continue Q4hrs until patient remains green for 12hrs  Escalate  MEWS: Escalate Yellow: Discuss with charge nurse and consider notifying provider and/or RRT  Notify: Charge Nurse/RN  Name of Charge Nurse/RN Notified RN Brandi  Provider Notification  Provider Name/Title [phone]  Date Provider Notified 09/06/23  Time Provider Notified 1604  Method of Notification Page [facility] CHART)  Notification Reason Other (Comment) (pt blood pressure is 79/57 and yellow MEWS)  Provider response See new orders  Date of Provider Response 09/06/23  Time of Provider Response 1605  Assess: SIRS CRITERIA  SIRS Temperature  0  SIRS Respirations  0  SIRS Pulse 0  SIRS WBC 0  SIRS Score Sum  0

## 2023-09-06 NOTE — Progress Notes (Signed)
 [facility] Progress Note  Patient: Robin Graves    MVH:[id number]  DOA: 09/04/2023     Date of Service: the patient was seen and examined on 09/06/2023  Chief Complaint  Patient presents with   Follow-up   Brief hospital course: KALLY CADDEN is a 83 y.o. female with medical history significant of  COPD, HLD, CAD, diastolic CHF, stroke, hypothyroidism, dementia, GERD, anemia, hypothyroidism (s/p of thyroidectomy due to Graves' disease), migraine headache, lung mass, recent esophagitis, aspiration pneumonia, recently diagnosed A-fib on Eliquis, who presents with weakness and hypotension.   Patient was recently hospitalized from 2/17 - 2/13 due to septic shock secondary to aspiration pneumonia.  Patient also has possible fungal esophagitis.  Patient is discharged on Augmentin and nystatin.  Per her daughter at the bedside, patient has been feeling weak.  It is generalized weakness.  No unilateral numbness or tingling in extremities.  Patient has poor appetite, decreased oral intake.  Patient was in PCPs office for follow-up and was found to have hypotension.  Her blood pressure is 83/56 which improved to 111/53 after giving 500 cc normal saline bolus in ED.   Patient has mild dry cough, and some chest discomfort, no SOB.  No fever or chills.  Patient does not have nausea, vomiting, diarrhea or abdominal pain.  No symptoms of UTI.  Patient complains of back pain. Note note, per EDP, pt was agreeable with hospice evaluation.  [name] with hospice team evaluated the patient and did not feel that pt currently met criteria for hospice house.      Data reviewed independently and ED Course: pt was found to have WBC 9.3, sodium 147, AKI with creatinine 1.40, BUN 27, GFR 37, (recent baseline creatinine 0.77 on 09/01/2023), temperature 97.5, heart rate 73, RR 27, oxygen saturation 91-98% on room air.  Patient is placed on telemetry bed for observation.   EKG: I have personally reviewed.  Sinus rhythm,  QTc 527, LAE, LVH, early R wave progression  2/27 discussed with patient's daughter at bedside, she would not continue blood draws, okay for 1 unit of PRBC transfusion, no more checking hemoglobin.  Continue only necessary treatment, no extra measures.  Keep her comfortable and hospice medical facility placement.  Assessment and Plan:  Acute hypoxic tori failure secondary to pulmonary edema most likely due to fluid resuscitation secondary to hypotension and dehydration IV fluid discontinued Continue supplemental O2 inhalation and gradually wean off Lasix 40 mg one-time dose given 2/28 Lasix 20 mg IV twice daily x 2 doses order placed   Symptomatic anemia, unknown cause, no obvious bleeding Discontinued Eliquis and aspirin 1 unit PRBC infusion given on 2/27 No more blood draws, no more transfusion.   Dehydration and hypotension, resolved after IV fluid resuscitation Continue to monitor vital signs and hydration status  Recent esophagitis: Possibly fungal esophagitis. -Continue nystatin until 09/06/2023   Recent aspiration pneumonia [facility]): No fever or leukocytosis. -let pt finish the last dose of Augmentin today -Continue prednisone 40 mg daily   Chronic diastolic CHF (congestive heart failure) (HCC): 2D echo on 08/26/2023 showed EF 60-65%.  Patient is clinically dry. -Watch volume status closely   Atrial fibrillation, chronic (HCC) -Continue amiodarone -DC'd Eliquis due to low Hb   Hypertension -Presented with low blood pressure Monitor BP off antihypertensive medications for now    CAD (coronary artery disease): -d/c'd Pravastatin due to comfort care, DC'd aspirin due to Hb   COPD (chronic obstructive pulmonary disease) (HCC) -Bronchodilators as needed Mucinex -  s/p prednisone 40 mg daily, gradually taper off prednisone Continue DuoNebs every 6 hourly scheduled, transition to as needed after improvement Started Mucinex 600 mg p.o. twice  daily   Hypothyroidism -Synthroid   Dementia with behavioral disturbance (HCC) -Fall precaution -DC'd donepezil due to comfort measures   Protein-calorie malnutrition, severe (HCC): Body weight 37.6 kg, BMI 17.96 -Ensure -Continue dronabinol   Body mass index is 18.86 kg/m.  Interventions:  Pressure Injury 08/30/23 Buttocks Left;Upper Stage 2 -  Partial thickness loss of dermis presenting as a shallow open injury with a red, pink wound bed without slough. 0.2*0.2 (Active)  08/30/23 1730  Location: Buttocks  Location Orientation: Left;Upper  Staging: Stage 2 -  Partial thickness loss of dermis presenting as a shallow open injury with a red, pink wound bed without slough.  Wound Description (Comments): 0.2*0.2  Present on Admission: No  Dressing Type Foam - Lift dressing to assess site every shift 09/06/23 0930     Diet: Dysphagia 2 diet DVT Prophylaxis: SCDs due to low hemoglobin  Advance goals of care discussion: DNR/DNI-limited  Family Communication: family was present at bedside, at the time of interview.  The pt provided permission to discuss medical plan with the family. Opportunity was given to ask question and all questions were answered satisfactorily.   Disposition:  Pt is from home, admitted with hypotension, low hemoglobin, transition to comfort care, awaiting for hospice inpatient facility placement, which precludes a safe discharge. Discharge to hospice, when bed will be available.  Subjective: No significant events overnight, patient still very short of breath but unable to express her feelings, she is always asking for going home.  Denied any chest pain or palpitations, no new complaints  Physical Exam: General: Mild to moderate respiratory distress affect anxious  Eyes: PERRLA ENT: Oral Mucosa Clear, moist  Neck: no JVD,  Cardiovascular: S1 and S2 Present, no Murmur,  Respiratory: Equal air entry bilaterally, bilateral crackles and minimal  wheezes Abdomen: Bowel Sound present, Soft and no tenderness,  Skin: no rashes Extremities: no Pedal edema, no calf tenderness Neurologic: without any new focal findings Gait not checked due to patient safety concerns  Vitals:   09/05/23 2110 09/06/23 0219 09/06/23 0733 09/06/23 0735  BP: (!) 101/50  (!) 100/56   Pulse:   85   Resp:   17   Temp:   97.6 F (36.4 C)   TempSrc:      SpO2:  96% 97% 95%  Weight:      Height:        Intake/Output Summary (Last 24 hours) at 09/06/2023 1249 Last data filed at 09/05/2023 1520 Gross per 24 hour  Intake 508.49 ml  Output --  Net 508.49 ml   Filed Weights   09/04/23 1227 09/05/23 0500  Weight: 37.6 kg 43.8 kg    Data Reviewed: I have personally reviewed and interpreted daily labs, tele strips, imagings as discussed above. I reviewed all nursing notes, pharmacy notes, vitals, pertinent old records I have discussed plan of care as described above with RN and patient/family.  CBC: Recent Labs  Lab 08/31/23 0527 09/01/23 0524 09/04/23 1252 09/05/23 0414 09/05/23 0813  WBC 10.6* 10.8* 9.3 7.2  --   NEUTROABS  --   --  8.4*  --   --   HGB 8.4* 8.1* 8.3* 6.7* 6.4*  HCT 24.7* 25.2* 26.7* 21.3* 20.2*  MCV 90.1 93.3 95.7 95.1  --   PLT 118* 124* 199 171  --    Basic  Metabolic Panel: Recent Labs  Lab 08/31/23 0527 09/01/23 0524 09/04/23 1252 09/04/23 1542 09/05/23 0414  NA 143 144 147*  --  145  K 3.4* 3.2* 3.8  --  3.5  CL 113* 112* 112*  --  114*  CO2 25 25 26   --  24  GLUCOSE 126* 78 90  --  127*  BUN 13 19 27*  --  27*  CREATININE 0.71 0.77 1.40*  --  1.28*  CALCIUM 8.1* 8.4* 8.9  --  8.2*  MG 2.0 2.1  --  2.0  --   PHOS 2.2* 2.2*  --   --  3.5    Studies: No results found.  Scheduled Meds:  amiodarone  200 mg Oral Daily   dronabinol  2.5 mg Oral BID AC   feeding supplement  237 mL Oral BID BM   ferrous sulfate  325 mg Oral Q breakfast   furosemide  20 mg Intravenous BID   guaiFENesin  600 mg Oral BID    ipratropium-albuterol  3 mL Nebulization Q6H   levothyroxine  50 mcg Oral QAC breakfast   nystatin  5 mL Oral QID   pantoprazole (PROTONIX) IV  40 mg Intravenous Q12H   [START ON 09/07/2023] predniSONE  30 mg Oral Q breakfast   Followed by   Melene Muller ON 09/10/2023] predniSONE  20 mg Oral Q breakfast   Followed by   Melene Muller ON ] predniSONE  10 mg Oral Q breakfast   sucralfate  1 g Oral TID WC & HS   Continuous Infusions: PRN Meds: acetaminophen, albuterol, diphenhydrAMINE, methocarbamol, oxyCODONE-acetaminophen  Time spent: 40 minutes  Author: Gillis Santa. MD [facility] 09/06/2023 12:49 PM  To reach On-call, see care teams to locate the attending and reach out to them via [email or web address]. If 7PM-7AM, please contact night-coverage If you still have difficulty reaching the attending provider, please page the [facility] (Director on Call) for [facility] on amion for assistance.

## 2023-09-07 DIAGNOSIS — E860 Dehydration: Secondary | ICD-10-CM | POA: Diagnosis not present

## 2023-09-07 MED ORDER — BUDESONIDE 0.25 MG/2ML IN SUSP
0.2500 mg | Freq: Two times a day (BID) | RESPIRATORY_TRACT | Status: DC
  Administered 2023-09-07 – 2023-09-11 (×9): 0.25 mg via RESPIRATORY_TRACT
  Filled 2023-09-07 (×9): qty 2

## 2023-09-07 MED ORDER — IPRATROPIUM-ALBUTEROL 0.5-2.5 (3) MG/3ML IN SOLN
3.0000 mL | Freq: Two times a day (BID) | RESPIRATORY_TRACT | Status: DC
  Administered 2023-09-07 – 2023-09-08 (×2): 3 mL via RESPIRATORY_TRACT
  Filled 2023-09-07 (×2): qty 3

## 2023-09-07 MED ORDER — ARFORMOTEROL TARTRATE 15 MCG/2ML IN NEBU
15.0000 ug | INHALATION_SOLUTION | Freq: Two times a day (BID) | RESPIRATORY_TRACT | Status: DC
  Administered 2023-09-07 – 2023-09-11 (×9): 15 ug via RESPIRATORY_TRACT
  Filled 2023-09-07 (×10): qty 2

## 2023-09-07 NOTE — Progress Notes (Signed)
 [facility] Progress Note  Patient: Robin Graves    ZOX:[id number]  DOA: 09/04/2023     Date of Service: the patient was seen and examined on 09/07/2023  Chief Complaint  Patient presents with   Follow-up   Brief hospital course: Robin Graves is a 83 y.o. female with medical history significant of  COPD, HLD, CAD, diastolic CHF, stroke, hypothyroidism, dementia, GERD, anemia, hypothyroidism (s/p of thyroidectomy due to Graves' disease), migraine headache, lung mass, recent esophagitis, aspiration pneumonia, recently diagnosed A-fib on Eliquis, who presents with weakness and hypotension.   Patient was recently hospitalized from 2/17 - 2/13 due to septic shock secondary to aspiration pneumonia.  Patient also has possible fungal esophagitis.  Patient is discharged on Augmentin and nystatin.  Per her daughter at the bedside, patient has been feeling weak.  It is generalized weakness.  No unilateral numbness or tingling in extremities.  Patient has poor appetite, decreased oral intake.  Patient was in PCPs office for follow-up and was found to have hypotension.  Her blood pressure is 83/56 which improved to 111/53 after giving 500 cc normal saline bolus in ED.   Patient has mild dry cough, and some chest discomfort, no SOB.  No fever or chills.  Patient does not have nausea, vomiting, diarrhea or abdominal pain.  No symptoms of UTI.  Patient complains of back pain. Note note, per EDP, pt was agreeable with hospice evaluation.  [name] with hospice team evaluated the patient and did not feel that pt currently met criteria for hospice house.      Data reviewed independently and ED Course: pt was found to have WBC 9.3, sodium 147, AKI with creatinine 1.40, BUN 27, GFR 37, (recent baseline creatinine 0.77 on 09/01/2023), temperature 97.5, heart rate 73, RR 27, oxygen saturation 91-98% on room air.  Patient is placed on telemetry bed for observation.   EKG: I have personally reviewed.  Sinus rhythm,  QTc 527, LAE, LVH, early R wave progression  2/27 discussed with patient's daughter at bedside, she would not continue blood draws, okay for 1 unit of PRBC transfusion, no more checking hemoglobin.  Continue only necessary treatment, no extra measures.  Keep her comfortable and hospice medical facility placement.  Assessment and Plan:  Acute hypoxic tori failure secondary to pulmonary edema most likely due to fluid resuscitation secondary to hypotension and dehydration IV fluid discontinued Continue supplemental O2 inhalation and gradually wean off Lasix 40 mg one-time dose given 2/28 Lasix 20 mg IV twice daily x 2 doses order placed 3/1 started Brovana and Pulmicort nebulizer twice daily  Symptomatic anemia, unknown cause, no obvious bleeding Discontinued Eliquis and aspirin S/p 1 unit PRBC infusion given on 2/27 No more blood draws, no more transfusion.   Dehydration and hypotension, resolved after IV fluid resuscitation Continue to monitor vital signs and hydration status  Recent esophagitis: Possibly fungal esophagitis. -Continue nystatin until 09/06/2023   Recent aspiration pneumonia [facility]): No fever or leukocytosis. -let pt finish the last dose of Augmentin today -Continue prednisone 40 mg daily   Chronic diastolic CHF (congestive heart failure) (HCC): 2D echo on 08/26/2023 showed EF 60-65%.  Patient is clinically dry. -Watch volume status closely   Atrial fibrillation, chronic (HCC) -Continue amiodarone -DC'd Eliquis due to low Hb   Hypertension -Presented with low blood pressure Monitor BP off antihypertensive medications for now Hypotension Started midodrine 10 mg p.o. 3 times daily    CAD (coronary artery disease): -d/c'd Pravastatin due to comfort care,  DC'd aspirin due to Hb   COPD (chronic obstructive pulmonary disease) (HCC) -Bronchodilators as needed Mucinex -s/p prednisone 40 mg daily, gradually taper off prednisone Continue DuoNebs every 6 hourly  scheduled, transition to as needed after improvement Started Mucinex 600 mg p.o. twice daily   Hypothyroidism -Synthroid   Dementia with behavioral disturbance (HCC) -Fall precaution -DC'd donepezil due to comfort measures   Protein-calorie malnutrition, severe (HCC): Body weight 37.6 kg, BMI 17.96 -Ensure -Continue dronabinol   Body mass index is 18.86 kg/m.  Interventions:  Pressure Injury 08/30/23 Buttocks Left;Upper Stage 2 -  Partial thickness loss of dermis presenting as a shallow open injury with a red, pink wound bed without slough. 0.2*0.2 (Active)  08/30/23 1730  Location: Buttocks  Location Orientation: Left;Upper  Staging: Stage 2 -  Partial thickness loss of dermis presenting as a shallow open injury with a red, pink wound bed without slough.  Wound Description (Comments): 0.2*0.2  Present on Admission: No  Dressing Type Foam - Lift dressing to assess site every shift 09/07/23 0900     Diet: Dysphagia 2 diet DVT Prophylaxis: SCDs due to low hemoglobin  Advance goals of care discussion: DNR/DNI-limited  Family Communication: family was not present at bedside, at the time of interview.  The pt provided permission to discuss medical plan with the family. Opportunity was given to ask question and all questions were answered satisfactorily.   Disposition:  Pt is from home, admitted with hypotension, low hemoglobin, transition to comfort care, awaiting for hospice inpatient facility placement, which precludes a safe discharge. Discharge to hospice, when bed will be available.  Subjective: No significant events overnight, patient has no insight, stated that she is feeling all right.  Still has shortness of breath and wearing oxygen via nasal cannula. Mild to moderate shortness of breath noticed  Physical Exam: General: Mild to moderate respiratory distress affect anxious  Eyes: PERRLA ENT: Oral Mucosa Clear, moist  Neck: no JVD,  Cardiovascular: S1 and S2  Present, no Murmur,  Respiratory: Equal air entry bilaterally, bilateral crackles and  wheezes Abdomen: Bowel Sound present, Soft and no tenderness,  Skin: no rashes Extremities: no Pedal edema, no calf tenderness Neurologic: without any new focal findings Gait not checked due to patient safety concerns  Vitals:   09/06/23 1604 09/06/23 1804 09/07/23 0436 09/07/23 0757  BP: (!) 79/57 (!) 76/44 (!) 91/55   Pulse: 84 82 75   Resp: 20 18 18    Temp: 97.9 F (36.6 C) 98 F (36.7 C) 98 F (36.7 C)   TempSrc:  Oral Oral   SpO2: 95% 90% 99% 94%  Weight:      Height:       No intake or output data in the 24 hours ending 09/07/23 1233  Filed Weights   09/04/23 1227 09/05/23 0500  Weight: 37.6 kg 43.8 kg    Data Reviewed: I have personally reviewed and interpreted daily labs, tele strips, imagings as discussed above. I reviewed all nursing notes, pharmacy notes, vitals, pertinent old records I have discussed plan of care as described above with RN and patient/family.  CBC: Recent Labs  Lab 09/01/23 0524 09/04/23 1252 09/05/23 0414 09/05/23 0813  WBC 10.8* 9.3 7.2  --   NEUTROABS  --  8.4*  --   --   HGB 8.1* 8.3* 6.7* 6.4*  HCT 25.2* 26.7* 21.3* 20.2*  MCV 93.3 95.7 95.1  --   PLT 124* 199 171  --    Basic Metabolic Panel: Recent  Labs  Lab 09/01/23 0524 09/04/23 1252 09/04/23 1542 09/05/23 0414  NA 144 147*  --  145  K 3.2* 3.8  --  3.5  CL 112* 112*  --  114*  CO2 25 26  --  24  GLUCOSE 78 90  --  127*  BUN 19 27*  --  27*  CREATININE 0.77 1.40*  --  1.28*  CALCIUM 8.4* 8.9  --  8.2*  MG 2.1  --  2.0  --   PHOS 2.2*  --   --  3.5    Studies: No results found.  Scheduled Meds:  amiodarone  200 mg Oral Daily   dronabinol  2.5 mg Oral BID AC   feeding supplement  237 mL Oral BID BM   ferrous sulfate  325 mg Oral Q breakfast   guaiFENesin  600 mg Oral BID   ipratropium-albuterol  3 mL Nebulization BID   levothyroxine  50 mcg Oral QAC breakfast    midodrine  10 mg Oral TID WC   nystatin  5 mL Oral QID   pantoprazole (PROTONIX) IV  40 mg Intravenous Q12H   predniSONE  30 mg Oral Q breakfast   Followed by   Melene Muller ON 09/10/2023] predniSONE  20 mg Oral Q breakfast   Followed by   Melene Muller ON ] predniSONE  10 mg Oral Q breakfast   sucralfate  1 g Oral TID WC & HS   Continuous Infusions: PRN Meds: acetaminophen, albuterol, diphenhydrAMINE, methocarbamol, oxyCODONE-acetaminophen  Time spent: 35 minutes  Author: Gillis Santa. MD [facility] 09/07/2023 12:33 PM  To reach On-call, see care teams to locate the attending and reach out to them via [email or web address]. If 7PM-7AM, please contact night-coverage If you still have difficulty reaching the attending provider, please page the [facility] (Director on Call) for [facility] on amion for assistance.

## 2023-09-07 NOTE — TOC Progression Note (Signed)
 Transition of Care [facility]) - Progression Note    Patient Details  Name: Robin Graves MRN: [id number] Date of Birth: October 15, 1940  Transition of Care [facility]) CM/SW Contact  Hetty Ely, RN Phone Number: 09/07/2023, 4:19 PM  Clinical Narrative:  Patient did not meet criteria for IP Hospice, called Daughter who is requesting another evaluation and feels that the MD evaluation was supportive of IP Hospice. Daughter also says patient is not stable enough to travel to [place] MD notified for guidance.     Expected Discharge Plan: Home w Hospice Care Barriers to Discharge: Other (must enter comment) (Home hospice setup.)  Expected Discharge Plan and Services     Post Acute Care Choice: Hospice, Durable Medical Equipment Living arrangements for the past 2 months: Single Family Home                                       Social Determinants of Health (SDOH) Interventions SDOH Screenings   Food Insecurity: Patient Unable To Answer (09/05/2023)  Housing: Unknown (09/05/2023)  Transportation Needs: No Transportation Needs (09/05/2023)  Utilities: Patient Unable To Answer (09/05/2023)  Financial Resource Strain: Low Risk  (08/21/2023)   Received from [facility]  Social Connections: Patient Declined (09/05/2023)  Tobacco Use: Medium Risk (09/04/2023)    Readmission Risk Interventions     No data to display

## 2023-09-07 NOTE — Progress Notes (Signed)
 [facility] Liaison Note  Met with Ms. Fawnda Kithcart at bedside.  She was sitting up in bed watching TV and finishing her icecream.  She states she doesn't have much of an appetite but she will eat when she goes home and ask me when that will be.  No family present.  Patient is not appropriate for IPU.  We can serve patient at home with hospice or at a LTC facility.  Medical Care team updated.   Norris Cross, RN Nurse Liaison [phone]

## 2023-09-07 NOTE — Care Management Important Message (Signed)
 Important Message  Patient Details  Name: Robin Graves MRN: [id number] Date of Birth: 1941/01/09   Important Message Given:  Yes - Medicare IM     Cristela Blue, CMA 09/07/2023, 9:37 AM

## 2023-09-07 NOTE — Plan of Care (Signed)

## 2023-09-08 DIAGNOSIS — E860 Dehydration: Secondary | ICD-10-CM | POA: Diagnosis not present

## 2023-09-08 MED ORDER — BISACODYL 5 MG PO TBEC
10.0000 mg | DELAYED_RELEASE_TABLET | Freq: Once | ORAL | Status: AC
  Administered 2023-09-08: 10 mg via ORAL
  Filled 2023-09-08: qty 2

## 2023-09-08 MED ORDER — GLYCOPYRROLATE 1 MG PO TABS: 1.0000 mg | ORAL_TABLET | ORAL | Status: DC | PRN

## 2023-09-08 MED ORDER — POLYETHYLENE GLYCOL 3350 17 G PO PACK
17.0000 g | PACK | Freq: Two times a day (BID) | ORAL | Status: DC
  Administered 2023-09-08 (×2): 17 g via ORAL
  Filled 2023-09-08 (×3): qty 1

## 2023-09-08 MED ORDER — BISACODYL 5 MG PO TBEC
10.0000 mg | DELAYED_RELEASE_TABLET | Freq: Every day | ORAL | Status: DC
  Administered 2023-09-08: 10 mg via ORAL
  Filled 2023-09-08: qty 2

## 2023-09-08 MED ORDER — ACETAMINOPHEN 650 MG RE SUPP: 650.0000 mg | Freq: Four times a day (QID) | RECTAL | Status: DC | PRN

## 2023-09-08 MED ORDER — GLYCOPYRROLATE 0.2 MG/ML IJ SOLN
0.2000 mg | INTRAMUSCULAR | Status: DC | PRN
  Administered 2023-09-10: 0.2 mg via INTRAVENOUS
  Filled 2023-09-08: qty 1

## 2023-09-08 MED ORDER — POLYVINYL ALCOHOL 1.4 % OP SOLN
1.0000 [drp] | Freq: Four times a day (QID) | OPHTHALMIC | Status: DC | PRN
Start: 2023-09-08 — End: 2023-09-11

## 2023-09-08 MED ORDER — MORPHINE SULFATE (PF) 2 MG/ML IV SOLN
2.0000 mg | INTRAVENOUS | Status: DC | PRN
Start: 2023-09-08 — End: 2023-09-08

## 2023-09-08 MED ORDER — BISACODYL 10 MG RE SUPP: 10.0000 mg | Freq: Every day | RECTAL | Status: DC | PRN

## 2023-09-08 MED ORDER — MORPHINE SULFATE (PF) 2 MG/ML IV SOLN
2.0000 mg | INTRAVENOUS | Status: DC | PRN
  Administered 2023-09-09 – 2023-09-10 (×3): 2 mg via INTRAVENOUS
  Filled 2023-09-08 (×3): qty 1

## 2023-09-08 MED ORDER — ACETAMINOPHEN 325 MG PO TABS: 650.0000 mg | ORAL_TABLET | Freq: Four times a day (QID) | ORAL | Status: DC | PRN

## 2023-09-08 MED ORDER — GLYCOPYRROLATE 0.2 MG/ML IJ SOLN: 0.2000 mg | INTRAMUSCULAR | Status: DC | PRN

## 2023-09-08 NOTE — Progress Notes (Signed)
 [facility] Progress Note  Patient: Robin Graves    HCW:[id number]  DOA: 09/04/2023     Date of Service: the patient was seen and examined on 09/08/2023  Chief Complaint  Patient presents with   Follow-up   Brief hospital course: Robin Graves is a 83 y.o. female with medical history significant of  COPD, HLD, CAD, diastolic CHF, stroke, hypothyroidism, dementia, GERD, anemia, hypothyroidism (s/p of thyroidectomy due to Graves' disease), migraine headache, lung mass, recent esophagitis, aspiration pneumonia, recently diagnosed A-fib on Eliquis, who presents with weakness and hypotension.   Patient was recently hospitalized from 2/17 - 2/13 due to septic shock secondary to aspiration pneumonia.  Patient also has possible fungal esophagitis.  Patient is discharged on Augmentin and nystatin.  Per her daughter at the bedside, patient has been feeling weak.  It is generalized weakness.  No unilateral numbness or tingling in extremities.  Patient has poor appetite, decreased oral intake.  Patient was in PCPs office for follow-up and was found to have hypotension.  Her blood pressure is 83/56 which improved to 111/53 after giving 500 cc normal saline bolus in ED.   Patient has mild dry cough, and some chest discomfort, no SOB.  No fever or chills.  Patient does not have nausea, vomiting, diarrhea or abdominal pain.  No symptoms of UTI.  Patient complains of back pain. Note note, per EDP, pt was agreeable with hospice evaluation.  Robin Graves with hospice team evaluated the patient and did not feel that pt currently met criteria for hospice house.      Data reviewed independently and ED Course: pt was found to have WBC 9.3, sodium 147, AKI with creatinine 1.40, BUN 27, GFR 37, (recent baseline creatinine 0.77 on 09/01/2023), temperature 97.5, heart rate 73, RR 27, oxygen saturation 91-98% on room air.  Patient is placed on telemetry bed for observation.   EKG: I have personally reviewed.  Sinus rhythm,  QTc 527, LAE, LVH, early R wave progression  2/27 discussed with patient's daughter at bedside, she would not continue blood draws, okay for 1 unit of PRBC transfusion, no more checking hemoglobin.  Continue only necessary treatment, no extra measures.  Keep her comfortable and hospice medical facility placement.  3/2 d/w patient's daughter over the phone and she would like her to be comfortable, discontinued all medications and started on comfort measure management.  Assessment and Plan:  Comfort measures only started on 09/08/23 Discontinued all unnecessary medications Continue breathing treatments and oxygen Started morphine IV as needed Comfort measures orders placed, RN to pronounce death.   Acute hypoxic respiratory failure secondary to acute pulmonary edema most likely due to fluid resuscitation secondary to hypotension and dehydration IV fluid discontinued Continue supplemental O2 inhalation S/p Lasix 40 mg x 1 and on 2/28 Lasix 20 mg IV twice daily x 1 dose 3/1 started Brovana and Pulmicort nebulizer twice daily   Symptomatic anemia, unknown cause, no obvious bleeding Discontinued Eliquis and aspirin S/p 1 unit PRBC infusion given on 2/27 No more blood draws, no more transfusion.   Dehydration and hypotension, resolved after IV fluid resuscitation Continue comfort measures now  Recent esophagitis: Possibly fungal esophagitis. -s/p nystatin until 09/06/2023 Continue comfort measures now   Recent aspiration pneumonia [facility]): No fever or leukocytosis. -s/p Augmentin, completed course. S/p prednisone taper, discontinued on 3/2 Continue comfort measures now   Chronic diastolic CHF (congestive heart failure) (HCC): 2D echo on 08/26/2023 showed EF 60-65%.  Patient is clinically dry. Continue comfort  measures now   Atrial fibrillation, chronic (HCC) -d/c'd amiodarone on 3/2 -DC'd Eliquis due to low Hb Continue comfort measures now   Hypertension -Presented with low blood  pressure, so currently off antihypertensive medications  Hypotension S/p midodrine 10 mg p.o. 3 times daily Continue comfort measures now   CAD (coronary artery disease): -d/c'd Pravastatin due to comfort care, DC'd aspirin due to Hb Continue comfort measures now   COPD (chronic obstructive pulmonary disease) (HCC) -Bronchodilators as needed Mucinex -s/p prednisone 40 mg daily, prednisone taper discontinued on 3/2 S/p Mucinex, DC'd on 3/2 Continue breathing treatments and supplemental O2 relation for comfort care   Hypothyroidism: d/c'd Synthroid on 3/2, Continue comfort measures now   Dementia with behavioral disturbance (HCC) -Fall precaution -DC'd donepezil due to comfort measures   Protein-calorie malnutrition, severe (HCC): Body weight 37.6 kg, BMI 17.96 -Ensure S/p dronabinol, d/c'd on 3/2, Continue comfort measures now   Body mass index is 18.86 kg/m.  Interventions:  Pressure Injury 08/30/23 Buttocks Left;Upper Stage 2 -  Partial thickness loss of dermis presenting as a shallow open injury with a red, pink wound bed without slough. 0.2*0.2 (Active)  08/30/23 1730  Location: Buttocks  Location Orientation: Left;Upper  Staging: Stage 2 -  Partial thickness loss of dermis presenting as a shallow open injury with a red, pink wound bed without slough.  Wound Description (Comments): 0.2*0.2  Present on Admission: No  Dressing Type Foam - Lift dressing to assess site every shift 09/08/23 0830     Diet: Dysphagia 2 diet DVT Prophylaxis: SCDs due to low hemoglobin  Advance goals of care discussion: Comfort measures only  Family Communication: family was not present at bedside, at the time of interview.  The pt provided permission to discuss medical plan with the family. Opportunity was given to ask question and all questions were answered satisfactorily.   Disposition:  Pt is from home, admitted with hypotension, low hemoglobin, transition to comfort measures  only. As per hospice care patient does not qualify for inpatient hospice facility. 3/2 discussed with patient's daughter, she agreed with the comfort measures only, so discontinued all medications and kept on comfort measures only.  We will continue to monitor and see how is her condition, whether she is stable to transfer to home with hospice in [place] or not?   Subjective: No significant events overnight, patient has no insight, stated that she is feeling all right.  Still has shortness of breath and wearing oxygen via nasal cannula. Mild to moderate shortness of breath noticed  Physical Exam: General: Mild to moderate respiratory distress affect anxious  Eyes: PERRLA ENT: Oral Mucosa Clear, moist  Neck: no JVD,  Cardiovascular: S1 and S2 Present, no Murmur,  Respiratory: Equal air entry bilaterally, bilateral crackles and  wheezes Abdomen: Bowel Sound present, Soft and no tenderness,  Skin: no rashes Extremities: no Pedal edema, no calf tenderness Neurologic: without any new focal findings Gait not checked due to patient safety concerns  Vitals:   09/07/23 2143 09/08/23 0143 09/08/23 0734 09/08/23 0753  BP: (!) 103/44 (!) 91/55  (!) 102/46  Pulse: 75 74  70  Resp: 17 16    Temp: 97.8 F (36.6 C) 98 F (36.7 C)  97.7 F (36.5 C)  TempSrc: Oral Oral    SpO2: 95% 96% 95% 100%  Weight:      Height:        Intake/Output Summary (Last 24 hours) at 09/08/2023 1252 Last data filed at 09/07/2023 1500 Gross per  24 hour  Intake 0 ml  Output --  Net 0 ml    Filed Weights   09/04/23 1227 09/05/23 0500  Weight: 37.6 kg 43.8 kg    Data Reviewed: I have personally reviewed and interpreted daily labs, tele strips, imagings as discussed above. I reviewed all nursing notes, pharmacy notes, vitals, pertinent old records I have discussed plan of care as described above with RN and patient/family.  CBC: Recent Labs  Lab 09/04/23 1252 09/05/23 0414 09/05/23 0813  WBC 9.3 7.2  --    NEUTROABS 8.4*  --   --   HGB 8.3* 6.7* 6.4*  HCT 26.7* 21.3* 20.2*  MCV 95.7 95.1  --   PLT 199 171  --    Basic Metabolic Panel: Recent Labs  Lab 09/04/23 1252 09/04/23 1542 09/05/23 0414  NA 147*  --  145  K 3.8  --  3.5  CL 112*  --  114*  CO2 26  --  24  GLUCOSE 90  --  127*  BUN 27*  --  27*  CREATININE 1.40*  --  1.28*  CALCIUM 8.9  --  8.2*  MG  --  2.0  --   PHOS  --   --  3.5    Studies: No results found.  Scheduled Meds:  arformoterol  15 mcg Nebulization BID   bisacodyl  10 mg Oral QHS   budesonide (PULMICORT) nebulizer solution  0.25 mg Nebulization BID   feeding supplement  237 mL Oral BID BM   nystatin  5 mL Oral QID   polyethylene glycol  17 g Oral BID   Continuous Infusions: PRN Meds: acetaminophen **OR** acetaminophen, acetaminophen, albuterol, bisacodyl, diphenhydrAMINE, glycopyrrolate **OR** glycopyrrolate **OR** glycopyrrolate, methocarbamol, morphine injection, oxyCODONE-acetaminophen, polyvinyl alcohol  Time spent: 55 minutes  Author: Gillis Santa. MD [facility] 09/08/2023 12:52 PM  To reach On-call, see care teams to locate the attending and reach out to them via [email or web address]. If 7PM-7AM, please contact night-coverage If you still have difficulty reaching the attending provider, please page the [facility] (Director on Call) for [facility] on amion for assistance.

## 2023-09-08 NOTE — TOC Progression Note (Addendum)
 Transition of Care [facility]) - Progression Note    Patient Details  Name: Robin Graves MRN: [id number] Date of Birth: 06-10-1941  [facility] CM/SW Contact  Maree Krabbe, LCSW Phone Number: 09/08/2023, 3:08 PM  Clinical Narrative:   Spoke with pt's daughter and she would like to use a different hospice company at this time. She states is has to be in [place]. SW did a Financial controller on [employer]. SW made a referral to [facility] in [place]. 02 will have to be ordered through the hospice company selected.    Expected Discharge Plan: Home w Hospice Care Barriers to Discharge: Other (must enter comment) (Home hospice setup.)  Expected Discharge Plan and Services     Post Acute Care Choice: Hospice, Durable Medical Equipment Living arrangements for the past 2 months: Single Family Home                                       Social Determinants of Health (SDOH) Interventions SDOH Screenings   Food Insecurity: Patient Unable To Answer (09/05/2023)  Housing: Unknown (09/05/2023)  Transportation Needs: No Transportation Needs (09/05/2023)  Utilities: Patient Unable To Answer (09/05/2023)  Financial Resource Strain: Low Risk  (08/21/2023)   Received from [facility]  Social Connections: Patient Declined (09/05/2023)  Tobacco Use: Medium Risk (09/04/2023)    Readmission Risk Interventions     No data to display

## 2023-09-08 NOTE — Plan of Care (Signed)

## 2023-09-09 DIAGNOSIS — E860 Dehydration: Secondary | ICD-10-CM | POA: Diagnosis not present

## 2023-09-09 NOTE — Progress Notes (Signed)
 [facility] Progress Note  Patient: Robin Graves    ZOX:[id number]  DOA: 09/04/2023     Date of Service: the patient was seen and examined on 09/09/2023  Chief Complaint  Patient presents with   Follow-up   Brief hospital course: Robin Graves is a 83 y.o. female with medical history significant of  COPD, HLD, CAD, diastolic CHF, stroke, hypothyroidism, dementia, GERD, anemia, hypothyroidism (s/p of thyroidectomy due to Graves' disease), migraine headache, lung mass, recent esophagitis, aspiration pneumonia, recently diagnosed A-fib on Eliquis, who presents with weakness and hypotension.   Patient was recently hospitalized from 2/17 - 2/13 due to septic shock secondary to aspiration pneumonia.  Patient also has possible fungal esophagitis.  Patient is discharged on Augmentin and nystatin.  Per her daughter at the bedside, patient has been feeling weak.  It is generalized weakness.  No unilateral numbness or tingling in extremities.  Patient has poor appetite, decreased oral intake.  Patient was in PCPs office for follow-up and was found to have hypotension.  Her blood pressure is 83/56 which improved to 111/53 after giving 500 cc normal saline bolus in ED.   Patient has mild dry cough, and some chest discomfort, no SOB.  No fever or chills.  Patient does not have nausea, vomiting, diarrhea or abdominal pain.  No symptoms of UTI.  Patient complains of back pain. Note note, per EDP, pt was agreeable with hospice evaluation.  Robin Graves with hospice team evaluated the patient and did not feel that pt currently met criteria for hospice house.      Data reviewed independently and ED Course: pt was found to have WBC 9.3, sodium 147, AKI with creatinine 1.40, BUN 27, GFR 37, (recent baseline creatinine 0.77 on 09/01/2023), temperature 97.5, heart rate 73, RR 27, oxygen saturation 91-98% on room air.  Patient is placed on telemetry bed for observation.   EKG: I have personally reviewed.  Sinus rhythm,  QTc 527, LAE, LVH, early R wave progression  2/27 discussed with patient's daughter at bedside, she would not continue blood draws, okay for 1 unit of PRBC transfusion, no more checking hemoglobin.  Continue only necessary treatment, no extra measures.  Keep her comfortable and hospice medical facility placement.  3/2 d/w patient's daughter over the phone and she would like her to be comfortable, discontinued all medications and started on comfort measure management.  Assessment and Plan:  Comfort measures only started on 09/08/23 Discontinued all unnecessary medications Continue breathing treatments and oxygen Started morphine IV as needed Comfort measures orders placed, RN to pronounce death.   Acute hypoxic respiratory failure secondary to acute pulmonary edema most likely due to fluid resuscitation secondary to hypotension and dehydration IV fluid discontinued Continue supplemental O2 inhalation S/p Lasix 40 mg x 1 and on 2/28 Lasix 20 mg IV twice daily x 1 dose 3/1 started Brovana and Pulmicort nebulizer twice daily   Symptomatic anemia, unknown cause, no obvious bleeding Discontinued Eliquis and aspirin S/p 1 unit PRBC infusion given on 2/27 No more blood draws, no more transfusion.   Dehydration and hypotension, resolved after IV fluid resuscitation Continue comfort measures now  Recent esophagitis: Possibly fungal esophagitis. -s/p nystatin until 09/06/2023 Continue comfort measures now   Recent aspiration pneumonia [facility]): No fever or leukocytosis. -s/p Augmentin, completed course. S/p prednisone taper, discontinued on 3/2 Continue comfort measures now   Chronic diastolic CHF (congestive heart failure) (HCC): 2D echo on 08/26/2023 showed EF 60-65%.  Patient is clinically dry. Continue comfort  measures now   Atrial fibrillation, chronic (HCC) -d/c'd amiodarone on 3/2 -DC'd Eliquis due to low Hb Continue comfort measures now   Hypertension -Presented with low blood  pressure, so currently off antihypertensive medications  Hypotension S/p midodrine 10 mg p.o. 3 times daily Continue comfort measures now   CAD (coronary artery disease): -d/c'd Pravastatin due to comfort care, DC'd aspirin due to Hb Continue comfort measures now   COPD (chronic obstructive pulmonary disease) (HCC) -Bronchodilators as needed Mucinex -s/p prednisone 40 mg daily, prednisone taper discontinued on 3/2 S/p Mucinex, DC'd on 3/2 Continue breathing treatments and supplemental O2 relation for comfort care   Hypothyroidism: d/c'd Synthroid on 3/2, Continue comfort measures now   Dementia with behavioral disturbance (HCC) -Fall precaution -DC'd donepezil due to comfort measures   Protein-calorie malnutrition, severe (HCC): Body weight 37.6 kg, BMI 17.96 -Ensure S/p dronabinol, d/c'd on 3/2, Continue comfort measures now   Body mass index is 17.27 kg/m.  Interventions:  Pressure Injury 08/30/23 Buttocks Left;Upper Stage 2 -  Partial thickness loss of dermis presenting as a shallow open injury with a red, pink wound bed without slough. 0.2*0.2 (Active)  08/30/23 1730  Location: Buttocks  Location Orientation: Left;Upper  Staging: Stage 2 -  Partial thickness loss of dermis presenting as a shallow open injury with a red, pink wound bed without slough.  Wound Description (Comments): 0.2*0.2  Present on Admission: No  Dressing Type Foam - Lift dressing to assess site every shift 09/09/23 0830     Diet: Dysphagia 2 diet DVT Prophylaxis: SCDs due to low hemoglobin  Advance goals of care discussion: Comfort measures only  Family Communication: family was not present at bedside, at the time of interview.  The pt provided permission to discuss medical plan with the family. Opportunity was given to ask question and all questions were answered satisfactorily.   Disposition:  Pt is from home, admitted with hypotension, low hemoglobin, transition to comfort measures  only. As per hospice care patient does not qualify for inpatient hospice facility. 3/2 discussed with patient's daughter, she agreed with the comfort measures only, so discontinued all medications and kept on comfort measures only.  We will continue to monitor and see how is her condition, whether she is stable to transfer to home with hospice in [place] or not?   Subjective: No significant events overnight, patient still has significant shortness of breath but she does not realize it.  Denies any shortness of breath, no chest pain, no nasal complaints.  She is breathing heavily, denied any pain.   Physical Exam: General: Mild to moderate respiratory distress affect anxious  Eyes: PERRLA ENT: Oral Mucosa Clear, moist  Neck: no JVD,  Cardiovascular: S1 and S2 Present, no Murmur,  Respiratory: Equal air entry bilaterally, bilateral crackles and  wheezes Abdomen: Bowel Sound present, Soft and no tenderness,  Skin: no rashes Extremities: no Pedal edema, no calf tenderness Neurologic: without any new focal findings Gait not checked due to patient safety concerns  Vitals:   09/08/23 2026 09/08/23 2033 09/09/23 0500 09/09/23 0737  BP:  (!) 110/49    Pulse:  63    Resp:      Temp:  (!) 97 F (36.1 C)    TempSrc:      SpO2: 96% 100%  99%  Weight:   40.1 kg   Height:        Intake/Output Summary (Last 24 hours) at 09/09/2023 1425 Last data filed at 09/08/2023 1500 Gross per 24 hour  Intake 0 ml  Output --  Net 0 ml    Filed Weights   09/04/23 1227 09/05/23 0500 09/09/23 0500  Weight: 37.6 kg 43.8 kg 40.1 kg    Data Reviewed: I have personally reviewed and interpreted daily labs, tele strips, imagings as discussed above. I reviewed all nursing notes, pharmacy notes, vitals, pertinent old records I have discussed plan of care as described above with RN and patient/family.  CBC: Recent Labs  Lab 09/04/23 1252 09/05/23 0414 09/05/23 0813  WBC 9.3 7.2  --   NEUTROABS 8.4*  --   --    HGB 8.3* 6.7* 6.4*  HCT 26.7* 21.3* 20.2*  MCV 95.7 95.1  --   PLT 199 171  --    Basic Metabolic Panel: Recent Labs  Lab 09/04/23 1252 09/04/23 1542 09/05/23 0414  NA 147*  --  145  K 3.8  --  3.5  CL 112*  --  114*  CO2 26  --  24  GLUCOSE 90  --  127*  BUN 27*  --  27*  CREATININE 1.40*  --  1.28*  CALCIUM 8.9  --  8.2*  MG  --  2.0  --   PHOS  --   --  3.5    Studies: No results found.  Scheduled Meds:  arformoterol  15 mcg Nebulization BID   bisacodyl  10 mg Oral QHS   budesonide (PULMICORT) nebulizer solution  0.25 mg Nebulization BID   feeding supplement  237 mL Oral BID BM   nystatin  5 mL Oral QID   polyethylene glycol  17 g Oral BID   Continuous Infusions: PRN Meds: acetaminophen **OR** acetaminophen, acetaminophen, albuterol, bisacodyl, diphenhydrAMINE, glycopyrrolate **OR** glycopyrrolate **OR** glycopyrrolate, methocarbamol, morphine injection, oxyCODONE-acetaminophen, polyvinyl alcohol  Time spent: 35 minutes  Author: Gillis Santa. MD [facility] 09/09/2023 2:25 PM  To reach On-call, see care teams to locate the attending and reach out to them via [email or web address]. If 7PM-7AM, please contact night-coverage If you still have difficulty reaching the attending provider, please page the [facility] (Director on Call) for [facility] on amion for assistance.

## 2023-09-09 NOTE — Progress Notes (Signed)
 [facility] Liaison Note  Daughter informed that patient can be re-evaluated for the Hospice Home this week.  Let daughter know that if patient is not IPU appropriate, and patient is not stable to travel to her home in [place], then she may want to speak with [facility] about LTC facility placement with Hospice follow up.  Daughter plans to reach out to [facility] to discuss  Please call with any Hospice related questions or concerns  Thank you for the opportunity to participate in this patient's care  [facility] Liaison [phone]

## 2023-09-10 DIAGNOSIS — E860 Dehydration: Secondary | ICD-10-CM | POA: Diagnosis not present

## 2023-09-10 MED ORDER — GUAIFENESIN ER 600 MG PO TB12
600.0000 mg | ORAL_TABLET | Freq: Four times a day (QID) | ORAL | Status: DC
  Administered 2023-09-10: 600 mg via ORAL
  Filled 2023-09-10: qty 1

## 2023-09-10 MED ORDER — GUAIFENESIN 100 MG/5ML PO LIQD
20.0000 mL | Freq: Four times a day (QID) | ORAL | Status: DC
  Filled 2023-09-10 (×3): qty 20

## 2023-09-10 NOTE — Progress Notes (Signed)
   09/10/23 1415  Spiritual Encounters  Type of Visit Initial  Care provided to: Pt and family  Conversation partners present during encounter Nurse  Reason for visit Routine spiritual support  OnCall Visit No   Chaplain visited comfort care patient and offered a compassionate presence and empathy to patient and her sister.    Rev. Rana M. Earlene Plater, MDiv Chaplain Resident [facility]

## 2023-09-10 NOTE — [place] FL2 (Signed)
 [place] MEDICAID FL2 LEVEL OF CARE FORM     IDENTIFICATION  Patient Name: Robin Graves Birthdate: [birth year] Sex: female Admission Date (Current Location): 09/04/2023  [facility] and [place] Number:  Chiropodist and Address:  [facility], [address], [place], [place] [address]      Provider Number: [id number]  Attending Physician Name and Address:  Gillis Santa, MD  Relative Name and Phone Number:  Jerrol Banana (Daughter)  [phone] (Mobile)    Current Level of Care: Hospital Recommended Level of Care: Skilled Nursing Facility Prior Approval Number:    Date Approved/Denied:   PASRR Number: [id number]  Discharge Plan: SNF    Current Diagnoses: Patient Active Problem List   Diagnosis Date Noted   Symptomatic anemia 09/05/2023   Dehydration 09/04/2023   Atrial fibrillation, chronic (HCC) 09/04/2023   Aspiration pneumonia (HCC) 09/04/2023   Hypotension 09/04/2023   Esophageal dysphagia 08/29/2023   Hypokalemia 08/28/2023   Hypophosphatemia 08/28/2023   Thrombocytopenia (HCC) 08/28/2023   Hypernatremia 08/28/2023   Septic shock (HCC) 08/28/2023   Aspiration pneumonia of both lower lobes due to gastric secretions (HCC) 08/28/2023   Diarrhea 08/23/2023   Esophagitis 08/22/2023   COPD (chronic obstructive pulmonary disease) (HCC) 08/22/2023   Dementia with behavioral disturbance (HCC) 08/22/2023   Protein-calorie malnutrition, severe (HCC) 08/22/2023   Leukocytosis 08/22/2023   Chronic diastolic CHF (congestive heart failure) (HCC) 08/14/2023   Pancytopenia (HCC) 08/14/2023   COPD with acute exacerbation (HCC) 08/14/2023   COPD with acute bronchitis (HCC) 08/14/2023   Influenza A 08/14/2023   Mass of upper lobe of right lung 08/14/2023   Tobacco use disorder 08/27/2016   S/P cardiac catheterization 03/01/2016   History of Graves' disease 02/22/2016   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   SOB (shortness of breath) on  exertion 09/01/2015   Pernicious anemia 06/30/2015   Hypertension 01/06/2015   CAD (coronary artery disease) 01/06/2015   Hypothyroidism 01/06/2015   Hyperlipidemia 01/06/2015   Congestive heart failure (HCC) 02/17/2014   Status post kyphoplasty 12/25/2013   Wedge compression fracture of T12 vertebra (HCC) 12/09/2013    Orientation RESPIRATION BLADDER Height & Weight     Self  O2 (2L nasal cannula) Incontinent Weight: 89 lb 15.2 oz (40.8 kg) Height:  5' (152.4 cm)  BEHAVIORAL SYMPTOMS/MOOD NEUROLOGICAL BOWEL NUTRITION STATUS      Incontinent Diet (see d/c summary)  AMBULATORY STATUS COMMUNICATION OF NEEDS Skin   Extensive Assist Verbally PU Stage and Appropriate Care (pressure injury left upper buttocks stage 2)                       Personal Care Assistance Level of Assistance  Bathing, Feeding, Dressing Bathing Assistance: Maximum assistance Feeding assistance: Limited assistance Dressing Assistance: Maximum assistance     Functional Limitations Info  Sight, Hearing, Speech Sight Info: Impaired Hearing Info: Adequate Speech Info: Adequate    SPECIAL CARE FACTORS FREQUENCY  PT (By licensed PT), OT (By licensed OT)     PT Frequency: 5x/week OT Frequency: 5x/week            Contractures Contractures Info: Not present    Additional Factors Info  Code Status, Allergies Code Status Info: DNR Allergies Info: Beef-derived Drug Products           Current Medications (09/10/2023):  This is the current hospital active medication list Current Facility-Administered Medications  Medication Dose Route Frequency Provider Last Rate Last Admin   acetaminophen (TYLENOL) tablet 650 mg  650 mg Oral Q6H PRN Gillis Santa, MD       Or   acetaminophen (TYLENOL) suppository 650 mg  650 mg Rectal Q6H PRN Gillis Santa, MD       acetaminophen (TYLENOL) tablet 325 mg  325 mg Oral Q6H PRN Lorretta Harp, MD       albuterol (PROVENTIL) (2.5 MG/3ML) 0.083% nebulizer solution 3 mL  3  mL Inhalation Q4H PRN Lorretta Harp, MD       arformoterol [facility]) nebulizer solution 15 mcg  15 mcg Nebulization BID Gillis Santa, MD   15 mcg at 09/10/23 0720   bisacodyl (DULCOLAX) EC tablet 10 mg  10 mg Oral QHS Gillis Santa, MD   10 mg at 09/08/23 2136   bisacodyl (DULCOLAX) suppository 10 mg  10 mg Rectal Daily PRN Gillis Santa, MD       budesonide (PULMICORT) nebulizer solution 0.25 mg  0.25 mg Nebulization BID Gillis Santa, MD   0.25 mg at 09/10/23 0720   diphenhydrAMINE (BENADRYL) injection 12.5 mg  12.5 mg Intravenous Q8H PRN Lorretta Harp, MD       feeding supplement (ENSURE ENLIVE / ENSURE PLUS) liquid 237 mL  237 mL Oral BID BM Lorretta Harp, MD   237 mL at 09/07/23 1355   glycopyrrolate (ROBINUL) tablet 1 mg  1 mg Oral Q4H PRN Gillis Santa, MD       Or   glycopyrrolate (ROBINUL) injection 0.2 mg  0.2 mg Subcutaneous Q4H PRN Gillis Santa, MD       Or   glycopyrrolate (ROBINUL) injection 0.2 mg  0.2 mg Intravenous Q4H PRN Gillis Santa, MD       methocarbamol (ROBAXIN) tablet 500 mg  500 mg Oral Q8H PRN Lorretta Harp, MD       morphine (PF) 2 MG/ML injection 2 mg  2 mg Intravenous Q2H PRN Gillis Santa, MD   2 mg at 09/09/23 1736   nystatin (MYCOSTATIN) 100000 UNIT/ML suspension 500,000 Units  5 mL Oral QID Lorretta Harp, MD   500,000 Units at 09/08/23 2136   oxyCODONE-acetaminophen (PERCOCET/ROXICET) 5-325 MG per tablet 1 tablet  1 tablet Oral Q4H PRN Lorretta Harp, MD       polyethylene glycol (MIRALAX / GLYCOLAX) packet 17 g  17 g Oral BID Gillis Santa, MD   17 g at 09/08/23 2136   polyvinyl alcohol (LIQUIFILM TEARS) 1.4 % ophthalmic solution 1 drop  1 drop Both Eyes QID PRN Gillis Santa, MD         Discharge Medications: Please see discharge summary for a list of discharge medications.  Relevant Imaging Results:  Relevant Lab Results:   Additional Information SSN [id number] [place], [place]

## 2023-09-10 NOTE — TOC Progression Note (Signed)
 Transition of Care [facility]) - Progression Note    Patient Details  Name: Robin Graves MRN: [id number] Date of Birth: 1941/07/02  Transition of [facility]) CM/SW Contact  Erin Sons, [place] Phone Number: 09/10/2023, 11:53 AM  Clinical Narrative:     [facility] in [place] is requesting additional clinicals; Those clinicals have been faxed.   CSW spoke with pt's daughter who states pt now has medicaid and provides number for [id number]. She would like to pursue LTC SNF with hospice locally. If a SNF cannot be secured she is agreeable to hospice at home in [place] with [facility]. CSW explained barrier to SNF including medicaid taking time to swith to LTC medicaid. CSW explained SNF's may be more likely to offer bed if pt comes under rehab at first. Daughter is agreeable to this plan if needed. Fl2 completed and bed requests sent in hub.   Expected Discharge Plan: Home w Hospice Care Barriers to Discharge: Other (must enter comment) (Home hospice setup.)  Expected Discharge Plan and Services     Post Acute Care Choice: Hospice, Durable Medical Equipment Living arrangements for the past 2 months: Single Family Home                                       Social Determinants of Health (SDOH) Interventions SDOH Screenings   Food Insecurity: Patient Unable To Answer (09/05/2023)  Housing: Unknown (09/05/2023)  Transportation Needs: No Transportation Needs (09/05/2023)  Utilities: Patient Unable To Answer (09/05/2023)  Financial Resource Strain: Low Risk  (08/21/2023)   Received from [facility]  Social Connections: Patient Declined (09/05/2023)  Tobacco Use: Medium Risk (09/04/2023)    Readmission Risk Interventions     No data to display

## 2023-09-10 NOTE — Progress Notes (Addendum)
 [facility] Liaison Note  Daughter asked that [facility] Liaison re-evaluate patient for the Hospice Home.  Will re-evaluate tomorrow.    Please call  with any Hospice related questions or concerns  Thank you for the opportunity to participate in this patient's care  [facility] Liaison [phone]

## 2023-09-10 NOTE — Progress Notes (Signed)
 [facility] Progress Note  Patient: Robin Graves    NGE:[id number]  DOA: 09/04/2023     Date of Service: the patient was seen and examined on 09/10/2023  Chief Complaint  Patient presents with   Follow-up   Brief hospital course: Robin Graves is a 83 y.o. female with medical history significant of  COPD, HLD, CAD, diastolic CHF, stroke, hypothyroidism, dementia, GERD, anemia, hypothyroidism (s/p of thyroidectomy due to Graves' disease), migraine headache, lung mass, recent esophagitis, aspiration pneumonia, recently diagnosed A-fib on Eliquis, who presents with weakness and hypotension.   Patient was recently hospitalized from 2/17 - 2/13 due to septic shock secondary to aspiration pneumonia.  Patient also has possible fungal esophagitis.  Patient is discharged on Augmentin and nystatin.  Per her daughter at the bedside, patient has been feeling weak.  It is generalized weakness.  No unilateral numbness or tingling in extremities.  Patient has poor appetite, decreased oral intake.  Patient was in PCPs office for follow-up and was found to have hypotension.  Her blood pressure is 83/56 which improved to 111/53 after giving 500 cc normal saline bolus in ED.   Patient has mild dry cough, and some chest discomfort, no SOB.  No fever or chills.  Patient does not have nausea, vomiting, diarrhea or abdominal pain.  No symptoms of UTI.  Patient complains of back pain. Note note, per EDP, pt was agreeable with hospice evaluation.  [name] with hospice team evaluated the patient and did not feel that pt currently met criteria for hospice house.      Data reviewed independently and ED Course: pt was found to have WBC 9.3, sodium 147, AKI with creatinine 1.40, BUN 27, GFR 37, (recent baseline creatinine 0.77 on 09/01/2023), temperature 97.5, heart rate 73, RR 27, oxygen saturation 91-98% on room air.  Patient is placed on telemetry bed for observation.   EKG: I have personally reviewed.  Sinus rhythm,  QTc 527, LAE, LVH, early R wave progression  2/27 discussed with patient's daughter at bedside, she would not continue blood draws, okay for 1 unit of PRBC transfusion, no more checking hemoglobin.  Continue only necessary treatment, no extra measures.  Keep her comfortable and hospice medical facility placement.  3/2 d/w patient's daughter over the phone and she would like her to be comfortable, discontinued all medications and started on comfort measure management.  Assessment and Plan:  Comfort measures only started on 09/08/23 Discontinued all unnecessary medications Continue breathing treatments and oxygen Started morphine IV as needed Comfort measures orders placed, RN to pronounce death.   Acute hypoxic respiratory failure secondary to acute pulmonary edema most likely due to fluid resuscitation secondary to hypotension and dehydration IV fluid discontinued Continue supplemental O2 inhalation S/p Lasix 40 mg x 1 and on 2/28 Lasix 20 mg IV twice daily x 1 dose 3/1 started Brovana and Pulmicort nebulizer twice daily Mucinex twice daily  Symptomatic anemia, unknown cause, no obvious bleeding Discontinued Eliquis and aspirin S/p 1 unit PRBC infusion given on 2/27 No more blood draws, no more transfusion.   Dehydration and hypotension, resolved after IV fluid resuscitation Continue comfort measures now  Recent esophagitis: Possibly fungal esophagitis. -s/p nystatin until 09/06/2023 Continue comfort measures now   Recent aspiration pneumonia [facility]): No fever or leukocytosis. -s/p Augmentin, completed course. S/p prednisone taper, discontinued on 3/2 Continue comfort measures now   Chronic diastolic CHF (congestive heart failure) (HCC): 2D echo on 08/26/2023 showed EF 60-65%.  Patient is clinically dry.  Continue comfort measures now   Atrial fibrillation, chronic (HCC) -d/c'd amiodarone on 3/2 -DC'd Eliquis due to low Hb Continue comfort measures now    Hypertension -Presented with low blood pressure, so currently off antihypertensive medications  Hypotension S/p midodrine 10 mg p.o. 3 times daily Continue comfort measures now   CAD (coronary artery disease): -d/c'd Pravastatin due to comfort care, DC'd aspirin due to Hb Continue comfort measures now   COPD (chronic obstructive pulmonary disease) (HCC) -Bronchodilators as needed Mucinex -s/p prednisone 40 mg daily, prednisone taper discontinued on 3/2 S/p Mucinex, DC'd on 3/2 Continue breathing treatments and supplemental O2 relation for comfort care   Hypothyroidism: d/c'd Synthroid on 3/2, Continue comfort measures now   Dementia with behavioral disturbance (HCC) -Fall precaution -DC'd donepezil due to comfort measures   Protein-calorie malnutrition, severe (HCC): Body weight 37.6 kg, BMI 17.96 -Ensure S/p dronabinol, d/c'd on 3/2, Continue comfort measures now   Body mass index is 17.57 kg/m.  Interventions:  Pressure Injury 08/30/23 Buttocks Left;Upper Stage 2 -  Partial thickness loss of dermis presenting as a shallow open injury with a red, pink wound bed without slough. 0.2*0.2 (Active)  08/30/23 1730  Location: Buttocks  Location Orientation: Left;Upper  Staging: Stage 2 -  Partial thickness loss of dermis presenting as a shallow open injury with a red, pink wound bed without slough.  Wound Description (Comments): 0.2*0.2  Present on Admission: No  Dressing Type Foam - Lift dressing to assess site every shift 09/10/23 0800     Diet: Dysphagia 2 diet DVT Prophylaxis: SCDs due to low hemoglobin  Advance goals of care discussion: Comfort measures only  Family Communication: family was not present at bedside, at the time of interview.  The pt provided permission to discuss medical plan with the family. Opportunity was given to ask question and all questions were answered satisfactorily.   Disposition:  Pt is from home, admitted with hypotension, low  hemoglobin, transition to comfort measures only. As per hospice care patient does not qualify for inpatient hospice facility. 3/2 discussed with patient's daughter, she agreed with the comfort measures only, so discontinued all medications and kept on comfort measures only.  We will continue to monitor and see how is her condition, whether she is stable to transfer to home with hospice in [place] or not?   Subjective: No significant events overnight, patient was breathing heavily and has productive cough but she does not realize it and stated that she is ready to go home. As per RN she is refusing all medications and has no p.o. intake, just drinking few sips of water.  Physical Exam: General: Mild to moderate respiratory distress affect anxious  Eyes: PERRLA ENT: Oral Mucosa Clear, moist  Neck: no JVD,  Cardiovascular: S1 and S2 Present, no Murmur,  Respiratory: Equal air entry bilaterally, bilateral crackles and  wheezes Abdomen: Bowel Sound present, Soft and no tenderness,  Skin: no rashes Extremities: no Pedal edema, no calf tenderness Neurologic: without any new focal findings Gait not checked due to patient safety concerns  Vitals:   09/09/23 0737 09/09/23 2020 09/10/23 0417 09/10/23 0720  BP:      Pulse:    (!) 102  Resp:    (!) 24  Temp:      TempSrc:      SpO2: 99% 95%  95%  Weight:   40.8 kg   Height:       No intake or output data in the 24 hours ending 09/10/23 1340  Filed Weights   09/05/23 0500 09/09/23 0500 09/10/23 0417  Weight: 43.8 kg 40.1 kg 40.8 kg    Data Reviewed: I have personally reviewed and interpreted daily labs, tele strips, imagings as discussed above. I reviewed all nursing notes, pharmacy notes, vitals, pertinent old records I have discussed plan of care as described above with RN and patient/family.  CBC: Recent Labs  Lab 09/04/23 1252 09/05/23 0414 09/05/23 0813  WBC 9.3 7.2  --   NEUTROABS 8.4*  --   --   HGB 8.3* 6.7* 6.4*  HCT  26.7* 21.3* 20.2*  MCV 95.7 95.1  --   PLT 199 171  --    Basic Metabolic Panel: Recent Labs  Lab 09/04/23 1252 09/04/23 1542 09/05/23 0414  NA 147*  --  145  K 3.8  --  3.5  CL 112*  --  114*  CO2 26  --  24  GLUCOSE 90  --  127*  BUN 27*  --  27*  CREATININE 1.40*  --  1.28*  CALCIUM 8.9  --  8.2*  MG  --  2.0  --   PHOS  --   --  3.5    Studies: No results found.  Scheduled Meds:  arformoterol  15 mcg Nebulization BID   bisacodyl  10 mg Oral QHS   budesonide (PULMICORT) nebulizer solution  0.25 mg Nebulization BID   feeding supplement  237 mL Oral BID BM   guaiFENesin  600 mg Oral Q6H   Or   guaiFENesin  20 mL Oral Q6H   nystatin  5 mL Oral QID   polyethylene glycol  17 g Oral BID   Continuous Infusions: PRN Meds: acetaminophen **OR** acetaminophen, acetaminophen, albuterol, bisacodyl, diphenhydrAMINE, glycopyrrolate **OR** glycopyrrolate **OR** glycopyrrolate, methocarbamol, morphine injection, oxyCODONE-acetaminophen, polyvinyl alcohol  Time spent: 35 minutes  Author: Gillis Santa. MD [facility] 09/10/2023 1:40 PM  To reach On-call, see care teams to locate the attending and reach out to them via [email or web address]. If 7PM-7AM, please contact night-coverage If you still have difficulty reaching the attending provider, please page the [facility] (Director on Call) for [facility] on amion for assistance.

## 2023-09-11 DIAGNOSIS — E860 Dehydration: Secondary | ICD-10-CM | POA: Diagnosis not present

## 2023-09-11 MED ORDER — BUDESONIDE 0.25 MG/2ML IN SUSP: 0.2500 mg | Freq: Two times a day (BID) | RESPIRATORY_TRACT | Status: DC

## 2023-09-11 MED ORDER — ACETAMINOPHEN 325 MG PO TABS: 325.0000 mg | ORAL_TABLET | Freq: Four times a day (QID) | ORAL | Status: DC | PRN

## 2023-09-11 MED ORDER — BISACODYL 5 MG PO TBEC: 10.0000 mg | DELAYED_RELEASE_TABLET | Freq: Every day | ORAL | Status: DC

## 2023-09-11 MED ORDER — ARFORMOTEROL TARTRATE 15 MCG/2ML IN NEBU: 15.0000 ug | INHALATION_SOLUTION | Freq: Two times a day (BID) | RESPIRATORY_TRACT | Status: DC

## 2023-09-11 MED ORDER — GUAIFENESIN 100 MG/5ML PO LIQD: 20.0000 mL | Freq: Four times a day (QID) | ORAL | Status: DC

## 2023-09-11 MED ORDER — BISACODYL 10 MG RE SUPP: 10.0000 mg | Freq: Every day | RECTAL | Status: DC | PRN

## 2023-09-11 NOTE — TOC Transition Note (Signed)
 Transition of Care [facility]) - Discharge Note   Patient Details  Name: Robin Graves MRN: [id number] Date of Birth: September 10, 1940  Transition of Care [facility] CM/SW Contact:  Erin Sons, LCSW Phone Number: 09/11/2023, 12:01 PM   Clinical Narrative:     Pt now meets criteria for Hospice Home. Pt's daughter has completed consents and Hospice Home is arranging transport. RN called report to [phone]. Transport forms are on the chart. CSW will sign off for now as social work intervention is no longer needed. Please consult [place] again if new needs arise.   Final next level of care: Hospice Medical Facility Barriers to Discharge: No Barriers Identified       Discharge Plan and Services Additional resources added to the After Visit Summary for       Post Acute Care Choice: Hospice, Durable Medical Equipment                               Social Drivers of Health (SDOH) Interventions SDOH Screenings   Food Insecurity: Patient Unable To Answer (09/05/2023)  Housing: Unknown (09/05/2023)  Transportation Needs: No Transportation Needs (09/05/2023)  Utilities: Patient Unable To Answer (09/05/2023)  Financial Resource Strain: Low Risk  (08/21/2023)   Received from [facility]  Social Connections: Patient Declined (09/05/2023)  Tobacco Use: Medium Risk (09/04/2023)     Readmission Risk Interventions     No data to display

## 2023-09-11 NOTE — Progress Notes (Signed)
 Patient report given to RN peggy. All questions are answered via phone. No any questions at this time. She is going to hospice facility with peripheral I/v line.

## 2023-09-11 NOTE — Plan of Care (Signed)
  Problem: Education: Goal: Knowledge of General Education information will improve Description: Including pain rating scale, medication(s)/side effects and non-pharmacologic comfort measures Outcome: Progressing   Problem: Clinical Measurements: Goal: Respiratory complications will improve Outcome: Progressing   Problem: Elimination: Goal: Will not experience complications related to bowel motility Outcome: Progressing   Problem: Safety: Goal: Ability to remain free from injury will improve Outcome: Progressing   

## 2023-09-11 NOTE — Progress Notes (Signed)
 [facility] Liaison Note  Patient approved for admission to the Hospice Home today.  Patient and family agreeable.  Consents have been signed by daughter for admission.  EMS contacted to transport patient to the Hospice Home this afternoon.    Thank you for the opportunity to participate in this patient's care.  [facility] Liaison [phone]

## 2023-09-11 NOTE — Discharge Summary (Signed)
 [facility] Discharge Summary   Patient: Robin Graves VWU:[id number]  PCP: Barbette Reichmann, MD  Date of admission: 09/04/2023   Date of discharge:  09/11/2023     Discharge Diagnoses:  Principal Problem:   Dehydration Active Problems:   Hypotension   Esophagitis   Aspiration pneumonia (HCC)   Chronic diastolic CHF (congestive heart failure) (HCC)   Atrial fibrillation, chronic (HCC)   Hypertension   CAD (coronary artery disease)   COPD (chronic obstructive pulmonary disease) (HCC)   Hypothyroidism   Dementia with behavioral disturbance (HCC)   Protein-calorie malnutrition, severe (HCC)   Symptomatic anemia   Admitted From: Home Disposition: Hospice/medical facility  Recommendations for Outpatient Follow-up:  Hospice care Follow up LABS/TEST:  None   Follow-up Information     Barbette Reichmann, MD Follow up.   Specialty: Internal Medicine Why: Hospital follow up Contact information: [address] [place] [address] [phone]                Diet recommendation: Dysphagia type 2 Thin Liquid  Activity: The patient is advised to gradually reintroduce usual activities, as tolerated  Discharge Condition: stable  Code Status: DNR/DNI, comfort care  History of present illness: As per the H and P dictated on admission.  Hospital Course:  Robin Graves is a 83 y.o. female with medical history significant of  COPD, HLD, CAD, diastolic CHF, stroke, hypothyroidism, dementia, GERD, anemia, hypothyroidism (s/p of thyroidectomy due to Graves' disease), migraine headache, lung mass, recent esophagitis, aspiration pneumonia, recently diagnosed A-fib on Eliquis, who presents with weakness and hypotension.   Patient was recently hospitalized from 2/17 - 2/13 due to septic shock secondary to aspiration pneumonia.  Patient also has possible fungal esophagitis.  Patient is discharged on Augmentin and nystatin.  Per her daughter at the  bedside, patient has been feeling weak.  It is generalized weakness.  No unilateral numbness or tingling in extremities.  Patient has poor appetite, decreased oral intake.  Patient was in PCPs office for follow-up and was found to have hypotension.  Her blood pressure is 83/56 which improved to 111/53 after giving 500 cc normal saline bolus in ED.   Patient has mild dry cough, and some chest discomfort, no SOB.  No fever or chills.  Patient does not have nausea, vomiting, diarrhea or abdominal pain.  No symptoms of UTI.  Patient complains of back pain. Note note, per EDP, pt was agreeable with hospice evaluation.  [name] with hospice team evaluated the patient and did not feel that pt currently met criteria for hospice house.      Data reviewed independently and ED Course: pt was found to have WBC 9.3, sodium 147, AKI with creatinine 1.40, BUN 27, GFR 37, (recent baseline creatinine 0.77 on 09/01/2023), temperature 97.5, heart rate 73, RR 27, oxygen saturation 91-98% on room air.  Patient is placed on telemetry bed for observation.   EKG: I have personally reviewed.  Sinus rhythm, QTc 527, LAE, LVH, early R wave progression   2/27 discussed with patient's daughter at bedside, she would not continue blood draws, okay for 1 unit of PRBC transfusion, no more checking hemoglobin.  Continue only necessary treatment, no extra measures.  Keep her comfortable and hospice medical facility placement.   3/2 d/w patient's daughter over the phone and she would like her to be comfortable, discontinued all medications and started on comfort measure management.   Assessment and Plan:   Comfort measures only started  on 09/08/23 Discontinued all unnecessary medications Continue breathing treatments and oxygen Started morphine IV as needed Comfort measures orders placed, RN to pronounce death.   During hospital stay patient was managed as below.  # Acute hypoxic respiratory failure secondary to acute pulmonary  edema most likely due to fluid resuscitation secondary to hypotension and dehydration, IV fluid discontinued Continue supplemental O2 inhalation S/p Lasix 40 mg x 1 and on 2/28 Lasix 20 mg IV twice daily x 1 dose. On 3/1 started Brovana and Pulmicort nebulizer twice daily Mucinex twice daily # Symptomatic anemia, unknown cause, no obvious bleeding. Discontinued Eliquis and aspirin S/p 1 unit PRBC infusion given on 2/27, No more blood draws, no more transfusion. # Dehydration and hypotension, resolved after IV fluid resuscitation. Continue comfort measures now # Recent esophagitis: Possibly fungal esophagitis. s/p nystatin until 09/06/2023, Continue comfort measures now # Recent aspiration pneumonia: No fever or leukocytosis. -s/p Augmentin, completed course. S/p prednisone taper, discontinued on 3/2. Continue comfort measures now # Chronic diastolic CHF (congestive heart failure): 2D echo on 08/26/2023 showed EF 60-65%.  Patient is clinically dry. Continue comfort measures now # Atrial fibrillation, chronic: d/c'd amiodarone on 3/2 DC'd Eliquis due to low Hb. Continue comfort measures now  # Hypertension; Presented with low blood pressure, so currently off antihypertensive medications  # Hypotension: S/p midodrine 10 mg p.o. 3 times daily Continue comfort measures now # CAD (coronary artery disease): d/c'd Pravastatin due to comfort care, DC'd aspirin due to Hb, Continue comfort measures now  # COPD (chronic obstructive pulmonary disease) (HCC) Bronchodilators as needed Mucinex s/p prednisone 40 mg daily, prednisone taper discontinued on 3/2 S/p Mucinex, DC'd on 3/2 Continue breathing treatments and supplemental O2 relation for comfort care # Hypothyroidism: d/c'd Synthroid on 3/2, Continue comfort measures now # Dementia with behavioral disturbance (HCC) Fall precaution, DC'd donepezil due to comfort measures # Protein-calorie malnutrition, severe (HCC): Body weight 37.6 kg, BMI 17.96, s/p  Ensure S/p dronabinol, d/c'd on 3/2, Continue comfort measures now   Body mass index is 17.57 kg/m.  Nutrition Interventions:  Pressure Injury 08/30/23 Buttocks Left;Upper Stage 2 -  Partial thickness loss of dermis presenting as a shallow open injury with a red, pink wound bed without slough. 0.2*0.2 (Active)  08/30/23 1730  Location: Buttocks  Location Orientation: Left;Upper  Staging: Stage 2 -  Partial thickness loss of dermis presenting as a shallow open injury with a red, pink wound bed without slough.  Wound Description (Comments): 0.2*0.2  Present on Admission: No  Dressing Type Foam - Lift dressing to assess site every shift 09/10/23 0800     On the day of the discharge the patient's vitals were stable, and no other acute medical condition were reported by patient. the patient was felt safe to be discharge at hospice/medical facility.   Consultants: Palliative, hospice care Procedures: None  Discharge Exam: General: Appear in mild to moderate respiratory distress, no Rash; Oral Mucosa Clear, dry. Cardiovascular: S1 and S2 Present, no Murmur, Respiratory: Equal air entry bilaterally, bilateral crackles and mild wheezing Abdomen: Bowel Sound present, Soft and no tenderness, no hernia Extremities: no Pedal edema, no calf tenderness Neurology: Grossly intact, no focal deficits  Affect depressed and little anxious to go home  Filed Weights   09/05/23 0500 09/09/23 0500 09/10/23 0417  Weight: 43.8 kg 40.1 kg 40.8 kg   Vitals:   09/10/23 1942 09/11/23 0828  BP:  (!) 78/57  Pulse:  86  Resp:  18  Temp:  98.2 F (  36.8 C)  SpO2: 92% 98%    DISCHARGE MEDICATION: Allergies as of 09/11/2023       Reactions   Codeine    unknown   Beef-derived Drug Products Hives, Other (See Comments)   Swelling         Medication List     STOP taking these medications    amiodarone 200 MG tablet Commonly known as: PACERONE   amoxicillin-clavulanate 400-57 MG/5ML  suspension Commonly known as: AUGMENTIN   apixaban 2.5 MG Tabs tablet Commonly known as: ELIQUIS   aspirin EC 81 MG tablet   cyanocobalamin 1000 MCG/ML injection Commonly known as: VITAMIN B12   donepezil 5 MG tablet Commonly known as: ARICEPT   dronabinol 2.5 MG capsule Commonly known as: MARINOL   feeding supplement Liqd   ferrous sulfate 325 (65 FE) MG tablet   levothyroxine 50 MCG tablet Commonly known as: SYNTHROID   levothyroxine 75 MCG tablet Commonly known as: SYNTHROID   nystatin 100000 UNIT/ML suspension Commonly known as: MYCOSTATIN   pantoprazole 40 MG tablet Commonly known as: Protonix   potassium chloride 10 MEQ tablet Commonly known as: KLOR-CON M   predniSONE 20 MG tablet Commonly known as: DELTASONE   sucralfate 1 GM/10ML suspension Commonly known as: CARAFATE       TAKE these medications    acetaminophen 325 MG tablet Commonly known as: TYLENOL Take 1 tablet (325 mg total) by mouth every 6 (six) hours as needed for mild pain (pain score 1-3), fever, moderate pain (pain score 4-6) or headache.   albuterol 108 (90 Base) MCG/ACT inhaler Commonly known as: VENTOLIN HFA Inhale 2 puffs into the lungs every 6 (six) hours as needed for wheezing or shortness of breath.   arformoterol 15 MCG/2ML Nebu Commonly known as: BROVANA Take 2 mLs (15 mcg total) by nebulization 2 (two) times daily.   bisacodyl 5 MG EC tablet Commonly known as: DULCOLAX Take 2 tablets (10 mg total) by mouth at bedtime.   bisacodyl 10 MG suppository Commonly known as: DULCOLAX Place 1 suppository (10 mg total) rectally daily as needed for severe constipation.   budesonide 0.25 MG/2ML nebulizer solution Commonly known as: PULMICORT Take 2 mLs (0.25 mg total) by nebulization 2 (two) times daily.   guaiFENesin 100 MG/5ML liquid Commonly known as: ROBITUSSIN Take 20 mLs by mouth every 6 (six) hours.   ipratropium-albuterol 0.5-2.5 (3) MG/3ML Soln Commonly known as:  DUONEB Take 3 mLs by nebulization every 6 (six) hours as needed.               Durable Medical Equipment  (From admission, onward)           Start     Ordered   09/08/23 1319  For home use only DME oxygen  Once       Question Answer Comment  Length of Need 6 Months   Mode or (Route) Nasal cannula   Liters per Minute 3   Frequency Continuous (stationary and portable oxygen unit needed)   Oxygen conserving device Yes   Oxygen delivery system Gas      09/08/23 1318              Discharge Care Instructions  (From admission, onward)           Start     Ordered   09/11/23 0000  Discharge wound care:       Comments: As above   09/11/23 1045  Allergies  Allergen Reactions   Codeine     unknown   Beef-Derived Drug Products Hives and Other (See Comments)    Swelling    Discharge Instructions     Discharge instructions   Complete by: As directed    F/u Hospice Care   Discharge wound care:   Complete by: As directed    As above   Increase activity slowly   Complete by: As directed        The results of significant diagnostics from this hospitalization (including imaging, microbiology, ancillary and laboratory) are listed below for reference.    Significant Diagnostic Studies: DG ESOPHAGUS W SINGLE CM (SOL OR THIN BA) Result Date: 08/29/2023 CLINICAL DATA:  History of aspiration pneumonia and dysphagia EXAM: ESOPHAGUS/BARIUM SWALLOW/TABLET STUDY TECHNIQUE: Single contrast examination was performed using thin liquid barium. This exam was performed by Alwyn Ren NP, and was supervised and interpreted by Dr. Fredia Sorrow. FLUOROSCOPY: Radiation Exposure Index (as provided by the fluoroscopic device): 8.60 mGy Kerma COMPARISON:  None Available. FINDINGS: Swallowing: Vestibular penetration observed without visualized overt aspiration below the vocal cords. Pharynx: Unremarkable. Esophagus: Normal appearance. Esophageal motility: Moderate to  severe dysmotility with tertiary contractions delayed transit of barium through the esophagus. Hiatal Hernia: None. Gastroesophageal reflux: None visualized. Ingested 13mm barium tablet: Patient unable to swallow barium tablet. Other: None. IMPRESSION: Vestibular penetration without visualized overt aspiration. Moderate to severe dysmotility with tertiary contractions and delayed transit of barium through the esophagus. Limited study due to patient's clinical status. Electronically Signed   By: Irish Lack M.D.   On: 08/29/2023 13:08   ECHOCARDIOGRAM COMPLETE Result Date: 08/26/2023    ECHOCARDIOGRAM REPORT   Patient Name:   MAYARI MATUS Date of Exam: 08/26/2023 Medical Rec #:  [id number]   Height:       60.0 in Accession #:    [id number]  Weight:       92.8 lb Date of Birth:  1941-04-12   BSA:          1.347 m Patient Age:    82 years    BP:           95/57 mmHg Patient Gender: F           HR:           92 bpm. Exam Location:  [facility] Procedure: 2D Echo, Cardiac Doppler and Color Doppler (Both Spectral and Color            Flow Doppler were utilized during procedure). Indications:     Shock R57.9  History:         Patient has no prior history of Echocardiogram examinations.                  COPD and Stroke.  Sonographer:     Cristela Blue Referring Phys:  [id number] CARALYN HUDSON Diagnosing Phys: Windell Norfolk  Sonographer Comments: Technically challenging study due to limited acoustic windows. Image acquisition challenging due to COPD. IMPRESSIONS  1. Technically difficult study.  2. Left ventricular ejection fraction, by estimation, is 60 to 65%. The left ventricle has normal function. The left ventricle has no regional wall motion abnormalities. There is mild left ventricular hypertrophy. Left ventricular diastolic parameters are indeterminate.  3. Right ventricular systolic function is normal. The right ventricular size is normal.  4. Moderate mitral valve regurgitation.  5. The aortic valve was not well  visualized. There is moderate calcification of the aortic valve. Aortic valve regurgitation is not visualized.  Mild aortic valve stenosis. FINDINGS  Left Ventricle: Left ventricular ejection fraction, by estimation, is 60 to 65%. The left ventricle has normal function. The left ventricle has no regional wall motion abnormalities. Strain imaging was not performed. The left ventricular internal cavity  size was normal in size. There is mild left ventricular hypertrophy. Left ventricular diastolic parameters are indeterminate. Right Ventricle: The right ventricular size is normal. No increase in right ventricular wall thickness. Right ventricular systolic function is normal. Left Atrium: Left atrial size was normal in size. Right Atrium: Right atrial size was normal in size. Pericardium: There is no evidence of pericardial effusion. Mitral Valve: There is mild thickening of the mitral valve leaflet(s). Mild mitral annular calcification. Moderate mitral valve regurgitation. MV peak gradient, 4.2 mmHg. The mean mitral valve gradient is 3.0 mmHg. Tricuspid Valve: The tricuspid valve is not well visualized. Tricuspid valve regurgitation is mild. Aortic Valve: Peak velocity 2.4 m/s, Mean gradient 13 mmHg, Dimensionless index 0.50. The aortic valve was not well visualized. There is moderate calcification of the aortic valve. Aortic valve regurgitation is not visualized. Mild aortic stenosis is present. Aortic valve mean gradient measures 9.2 mmHg. Aortic valve peak gradient measures 16.6 mmHg. Aortic valve area, by VTI measures 1.90 cm. Pulmonic Valve: The pulmonic valve was not well visualized. Pulmonic valve regurgitation is trivial. Aorta: The aortic root is normal in size and structure. Venous: The inferior vena cava was not well visualized. IAS/Shunts: The interatrial septum was not well visualized. Additional Comments: 3D imaging was not performed.  LEFT VENTRICLE PLAX 2D LVIDd:         2.20 cm   Diastology LVIDs:          1.50 cm   LV e' medial:    7.62 cm/s LV PW:         0.90 cm   LV E/e' medial:  11.5 LV IVS:        1.10 cm   LV e' lateral:   11.40 cm/s LVOT diam:     2.00 cm   LV E/e' lateral: 7.7 LV SV:         68 LV SV Index:   50 LVOT Area:     3.14 cm  RIGHT VENTRICLE RV Basal diam:  2.90 cm RV Mid diam:    3.20 cm RV S prime:     13.60 cm/s TAPSE (M-mode): 1.9 cm LEFT ATRIUM           Index        RIGHT ATRIUM          Index LA diam:      2.80 cm 2.08 cm/m   RA Area:     7.09 cm LA Vol (A2C): 9.1 ml  6.76 ml/m   RA Volume:   10.30 ml 7.65 ml/m LA Vol (A4C): 18.1 ml 13.44 ml/m  AORTIC VALVE AV Area (Vmax):    1.80 cm AV Area (Vmean):   1.96 cm AV Area (VTI):     1.90 cm AV Vmax:           204.00 cm/s AV Vmean:          137.750 cm/s AV VTI:            0.358 m AV Peak Grad:      16.6 mmHg AV Mean Grad:      9.2 mmHg LVOT Vmax:         117.00 cm/s LVOT Vmean:  86.100 cm/s LVOT VTI:          0.216 m LVOT/AV VTI ratio: 0.60  AORTA Ao Root diam: 2.90 cm MITRAL VALVE                  TRICUSPID VALVE MV Area (PHT): 3.76 cm       TR Peak grad:   20.8 mmHg MV Area VTI:   2.94 cm       TR Vmax:        228.00 cm/s MV Peak grad:  4.2 mmHg MV Mean grad:  3.0 mmHg       SHUNTS MV Vmax:       1.02 m/s       Systemic VTI:  0.22 m MV Vmean:      78.2 cm/s      Systemic Diam: 2.00 cm MV Decel Time: 202 msec MR Peak grad:    99.2 mmHg MR Mean grad:    51.0 mmHg MR Vmax:         498.00 cm/s MR Vmean:        326.0 cm/s MR PISA:         1.01 cm MR PISA Eff ROA: 6 mm MR PISA Radius:  0.40 cm MV E velocity: 88.00 cm/s MV A velocity: 100.00 cm/s MV E/A ratio:  0.88 Windell Norfolk Electronically signed by Windell Norfolk Signature Date/Time: 08/26/2023/5:38:03 PM    Final    DG Chest Port 1 View Result Date: 08/26/2023 CLINICAL DATA:  Atrial fibrillation.  Unresponsive. EXAM: PORTABLE CHEST 1 VIEW COMPARISON:  08/22/2023 FINDINGS: Marked thoracolumbar scoliosis. Cardiopericardial silhouette is at upper limits of normal for size.  Diffuse interstitial and patchy/nodular bilateral airspace disease appears progressive in the interval, potentially secondary to edema given the relatively rapid progression. Diffuse infection not excluded. Nodular right parahilar density seen previously is again noted. Bones are diffusely demineralized. Telemetry leads overlie the chest. IMPRESSION: 1. Progressive diffuse interstitial and patchy/nodular bilateral airspace disease, potentially secondary to edema given the relatively rapid progression. Diffuse infection not excluded. Close follow-up recommended to ensure resolution. 2. Nodular right parahilar density seen previously is again noted. Electronically Signed   By: Kennith Center M.D.   On: 08/26/2023 08:11   DG Chest 2 View Result Date: 08/22/2023 CLINICAL DATA:  Increasing pain in mouth, esophagus, and stomach for 1 week, dysphagia EXAM: CHEST - 2 VIEW COMPARISON:  08/14/2023, 08/13/2023 FINDINGS: Frontal and lateral views of the chest demonstrate a stable cardiac silhouette. The right upper lobe mass seen on prior CT is faintly apparent adjacent to the right hilum on the frontal view, concerning for malignancy based on previous CT imaging. The subcentimeter left upper lobe nodule seen on prior CT is not well visualized by x-ray. Stable asymmetric right apical pleural thickening, likely benign scarring given long-term stability. No acute airspace disease, effusion, or pneumothorax. Stable thoracic compression deformities with prior vertebral augmentations. IMPRESSION: 1. Stable right upper lobe perihilar mass concerning for malignancy based on previous CT findings. Subcentimeter left upper lobe nodule seen on prior CT is not visualized by x-ray. PET scan again recommended if not performed in the interim. 2. No acute airspace disease. Electronically Signed   By: Sharlet Salina M.D.   On: 08/22/2023 15:34   CT Angio Chest PE W and/or Wo Contrast Result Date: 08/14/2023 CLINICAL DATA:  Shortness of  breath, abdominal pain, positive flu test on 4 days ago. Also, PA and lateral chest yesterday demonstrated suspected mass in the retrosternal clear  space on the lateral view only. EXAM: CT ANGIOGRAPHY CHEST WITH CONTRAST TECHNIQUE: Multidetector CT imaging of the chest was performed using the standard protocol during bolus administration of intravenous contrast. Multiplanar CT image reconstructions and MIPs were obtained to evaluate the vascular anatomy. RADIATION DOSE REDUCTION: This exam was performed according to the departmental dose-optimization program which includes automated exposure control, adjustment of the mA and/or kV according to patient size and/or use of iterative reconstruction technique. CONTRAST:  75mL OMNIPAQUE IOHEXOL 350 MG/ML SOLN COMPARISON:  PA and lateral chest yesterday, and chest, abdomen and pelvis CT with IV contrast 04/29/2020. FINDINGS: Cardiovascular: The cardiac size is normal. There is no pericardial effusion. Three-vessel coronary artery calcifications greatest in the LAD, additional scattered calcification in the left main coronary artery. There is moderate calcification and thickening of the aortic valve leaflets. Consider echocardiographic evaluation. The aorta is normal in caliber with mild tortuosity and moderate calcific plaques, with atherosclerosis in the great vessels. Pulmonary arteries are normal in caliber without evidence of thromboemboli. The pulmonary veins are slightly distended but no more than previously. Mediastinum/Nodes: No enlarged mediastinal, hilar, or axillary lymph nodes. Thyroid gland, trachea, and esophagus demonstrate no significant findings. There is a small hiatal hernia. Lungs/Pleura: There is right-greater-than-left biapical pleural-parenchymal scarring, with right apical volume loss and upward hilar retraction and left apical linear calcifications within the scarring. On the right scarring changes merge with coarsely nodular chronic scar-like  opacities. There are mild centrilobular emphysematous changes in the lungs. Anteriorly in the base of the right upper lobe there is new demonstration of a lobular solid nodule with pleural stranding measuring 2.1 x 2 x 1.8 cm (measured on 5:56 and 7:32). This is highly worrisome for a primary bronchogenic neoplasm. PET-CT or tissue sampling recommended. In the left upper lobe a new nodule is also noted on 5:34, with slight cavitation and measuring 8 x 6 mm. This could be a neoplasm or an inflammatory nodule. In the left lower lobe, there is a chronic stable 4 mm nodule on 5:97. There are scattered linear scar-like opacities in the bases. There is no consolidation, effusion or further nodules. Upper Abdomen: No acute abnormality. Status post cholecystectomy with chronically prominent common bile duct. Abdominal aortic atherosclerosis. Musculoskeletal: There are multilevel thoracic spine chronic compression fractures, with old kyphoplasty at T9 and 12. Osteopenia and degenerative change with thoracic kyphosis. No new or progressive thoracic compression fractures. The ribcage is intact. No destructive bone lesion. Bilateral breast implants noted with chronic collapse of the left implant. Review of the MIP images confirms the above findings. IMPRESSION: 1. No evidence of arterial dilatation or thromboembolism. 2. 2.1 x 2 x 1.8 cm lobular solid nodule with pleural stranding in the base of the right upper lobe anteriorly, highly worrisome for a primary bronchogenic neoplasm. PET-CT or tissue sampling recommended. 3. Additional new 8 x 6 mm cavitary nodule in the left upper lobe, could be a neoplasm or an inflammatory nodule. 4. Emphysema. 5. Aortic and coronary artery atherosclerosis. 6. Moderate calcification and thickening of the aortic valve leaflets. Consider echocardiographic evaluation. 7. Small hiatal hernia. 8. Osteopenia and degenerative change with multilevel thoracic spine chronic compression fractures, with  old kyphoplasty at T9 and 12. 9. 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 Aortic Atherosclerosis (ICD10-I70.0) and Emphysema (ICD10-J43.9). Electronically Signed   By: Almira Bar M.D.   On: 08/14/2023 01:21   DG Chest 2 View Result Date: 08/13/2023 CLINICAL DATA:  Shortness of breath. EXAM: CHEST - 2 VIEW COMPARISON:  07/04/2013.  Chest CT dated 04/29/2020. FINDINGS: Enlarged cardiac silhouette. Hyperexpanded lungs. Interval nodular density overlying the anterior upper thorax on the lateral view, not seen on the frontal view. No significant change in right greater than left biapical pleural and parenchymal scarring. Multiple thoracic and lumbar compression deformities are again demonstrated with kyphoplasty at 2 levels. Diffuse osteopenia. Mild-to-moderate dextroconvex thoracolumbar scoliosis. Cholecystectomy clips. Atheromatous aortic calcifications. IMPRESSION: 1. Interval nodular density overlying the anterior upper thorax on the lateral view, not seen on the frontal view. Recommend chest CT for further evaluation. 2. COPD. 3. Cardiomegaly. Electronically Signed   By: Beckie Salts M.D.   On: 08/13/2023 19:39    Microbiology: No results found for this or any previous visit (from the past 240 hours).   Labs: CBC: Recent Labs  Lab 09/04/23 1252 09/05/23 0414 09/05/23 0813  WBC 9.3 7.2  --   NEUTROABS 8.4*  --   --   HGB 8.3* 6.7* 6.4*  HCT 26.7* 21.3* 20.2*  MCV 95.7 95.1  --   PLT 199 171  --    Basic Metabolic Panel: Recent Labs  Lab 09/04/23 1252 09/04/23 1542 09/05/23 0414  NA 147*  --  145  K 3.8  --  3.5  CL 112*  --  114*  CO2 26  --  24  GLUCOSE 90  --  127*  BUN 27*  --  27*  CREATININE 1.40*  --  1.28*  CALCIUM 8.9  --  8.2*  MG  --  2.0  --   PHOS  --   --  3.5   Liver Function Tests: Recent Labs  Lab 09/04/23 1252 09/05/23 0414  AST 58* 34  ALT 82* 56*  ALKPHOS 164* 111  BILITOT 0.2 0.5  PROT 6.8 5.4*  ALBUMIN 1.8* <1.5*   No results for  input(s): "LIPASE", "AMYLASE" in the last 168 hours. No results for input(s): "AMMONIA" in the last 168 hours. Cardiac Enzymes: No results for input(s): "CKTOTAL", "CKMB", "CKMBINDEX", "TROPONINI" in the last 168 hours. BNP (last 3 results) Recent Labs    08/23/23 0446 08/26/23 0704 09/04/23 1542  BNP 164.0* 402.6* 328.5*   CBG: No results for input(s): "GLUCAP" in the last 168 hours.  Time spent: 35 minutes  Signed:  Gillis Santa  [facility] 09/11/2023 10:45 AM

## 2023-09-11 NOTE — Progress Notes (Signed)
 Nutrition Brief Note  Chart reviewed. Pt now transitioning to comfort care.  No further nutrition interventions planned at this time.  Please re-consult as needed.   Levada Schilling, RD, LDN, CDCES Registered Dietitian III Certified Diabetes Care and Education Specialist If unable to reach this RD, please use "RD Inpatient" group chat on secure chat between hours of 8am-4 pm daily

## 2023-10-08 DEATH — deceased
